# Patient Record
Sex: Male | Born: 1955
Health system: Southern US, Community
[De-identification: ages and names within clinical notes are randomized; demographics above are authoritative.]

## PROBLEM LIST (undated history)

## (undated) DIAGNOSIS — I10 Essential (primary) hypertension: Secondary | ICD-10-CM

## (undated) DIAGNOSIS — Z0389 Encounter for observation for other suspected diseases and conditions ruled out: Secondary | ICD-10-CM

## (undated) DIAGNOSIS — Z8739 Personal history of other diseases of the musculoskeletal system and connective tissue: Secondary | ICD-10-CM

## (undated) DIAGNOSIS — E785 Hyperlipidemia, unspecified: Secondary | ICD-10-CM

## (undated) HISTORY — DX: Essential (primary) hypertension: I10

---

## 1998-12-01 ENCOUNTER — Emergency Department (HOSPITAL_COMMUNITY): Admission: EM | Admit: 1998-12-01 | Discharge: 1998-12-01 | Payer: Self-pay | Admitting: Emergency Medicine

## 1999-06-12 ENCOUNTER — Emergency Department (HOSPITAL_COMMUNITY): Admission: EM | Admit: 1999-06-12 | Discharge: 1999-06-12 | Payer: Self-pay | Admitting: Emergency Medicine

## 1999-06-12 ENCOUNTER — Encounter: Payer: Self-pay | Admitting: Emergency Medicine

## 2000-09-05 ENCOUNTER — Emergency Department (HOSPITAL_COMMUNITY): Admission: EM | Admit: 2000-09-05 | Discharge: 2000-09-05 | Payer: Self-pay | Admitting: Emergency Medicine

## 2000-09-05 ENCOUNTER — Encounter: Payer: Self-pay | Admitting: Emergency Medicine

## 2000-10-10 ENCOUNTER — Encounter: Admission: RE | Admit: 2000-10-10 | Discharge: 2001-01-08 | Payer: Self-pay | Admitting: Orthopedic Surgery

## 2001-04-04 ENCOUNTER — Emergency Department (HOSPITAL_COMMUNITY): Admission: EM | Admit: 2001-04-04 | Discharge: 2001-04-04 | Payer: Self-pay | Admitting: Emergency Medicine

## 2007-12-26 DIAGNOSIS — IMO0001 Reserved for inherently not codable concepts without codable children: Secondary | ICD-10-CM

## 2007-12-26 HISTORY — PX: CARDIAC CATHETERIZATION: SHX172

## 2007-12-26 HISTORY — DX: Reserved for inherently not codable concepts without codable children: IMO0001

## 2007-12-28 ENCOUNTER — Observation Stay (HOSPITAL_COMMUNITY): Admission: EM | Admit: 2007-12-28 | Discharge: 2007-12-29 | Payer: Self-pay | Admitting: Emergency Medicine

## 2007-12-28 ENCOUNTER — Encounter (INDEPENDENT_AMBULATORY_CARE_PROVIDER_SITE_OTHER): Payer: Self-pay | Admitting: *Deleted

## 2007-12-28 IMAGING — CR DG CHEST 1V PORT
1 series · 1 of 1 positions shown · non-contrast
Comparison: None

CLINICAL DATA: Chest pain shortness of breath

PORTABLE CHEST - 1 VIEW

[AP]
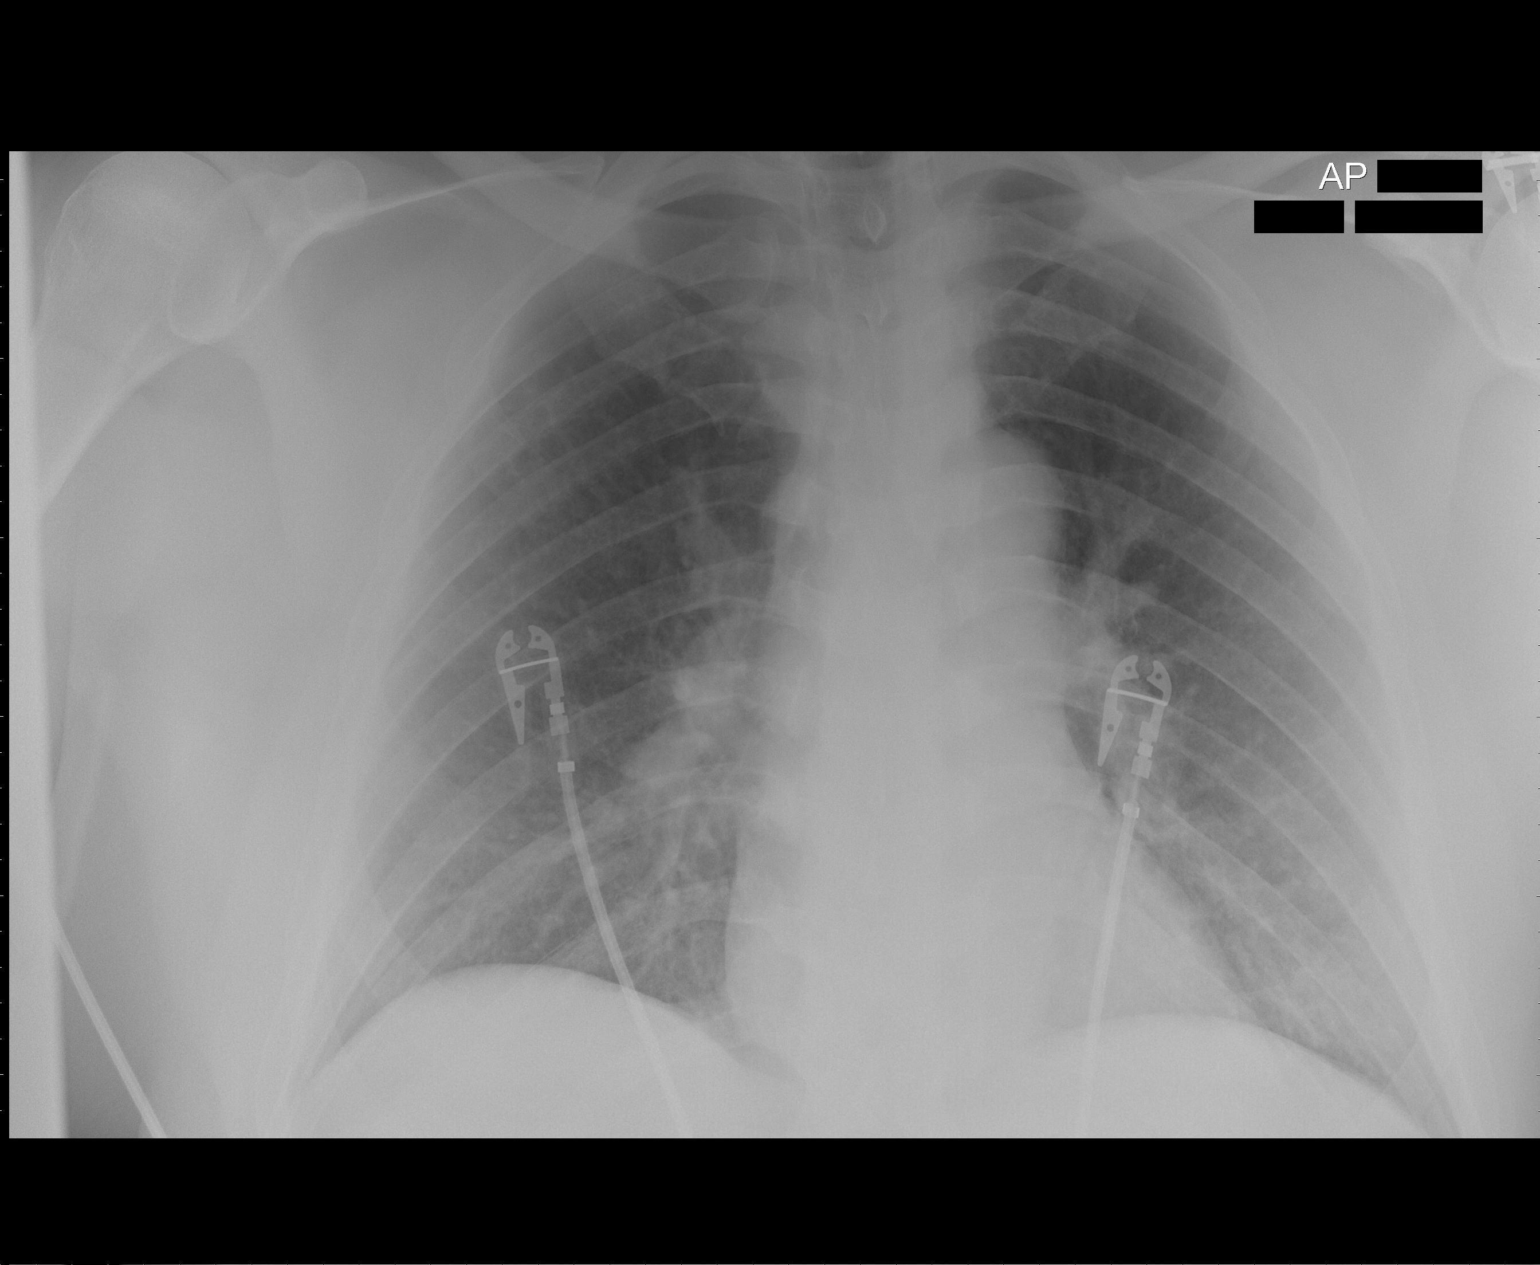

[1 of 1 positions shown; findings below may reference images not displayed]

FINDINGS: Midline trachea. Normal heart size and mediastinal
contours. No pleural effusion or pneumothorax. Vague increased
density at the left lung base is  likely atelectasis and/or
vascular crowding.
IMPRESSION: No acute cardiopulmonary disease.

## 2008-04-17 ENCOUNTER — Inpatient Hospital Stay (HOSPITAL_COMMUNITY): Admission: EM | Admit: 2008-04-17 | Discharge: 2008-04-18 | Payer: Self-pay | Admitting: Emergency Medicine

## 2008-04-17 IMAGING — CR DG CHEST 2V
2 series · 2 of 2 positions shown · non-contrast
Comparison: [DATE]

CLINICAL DATA: Chest pain

CHEST - 2 VIEW

[w chest pa]
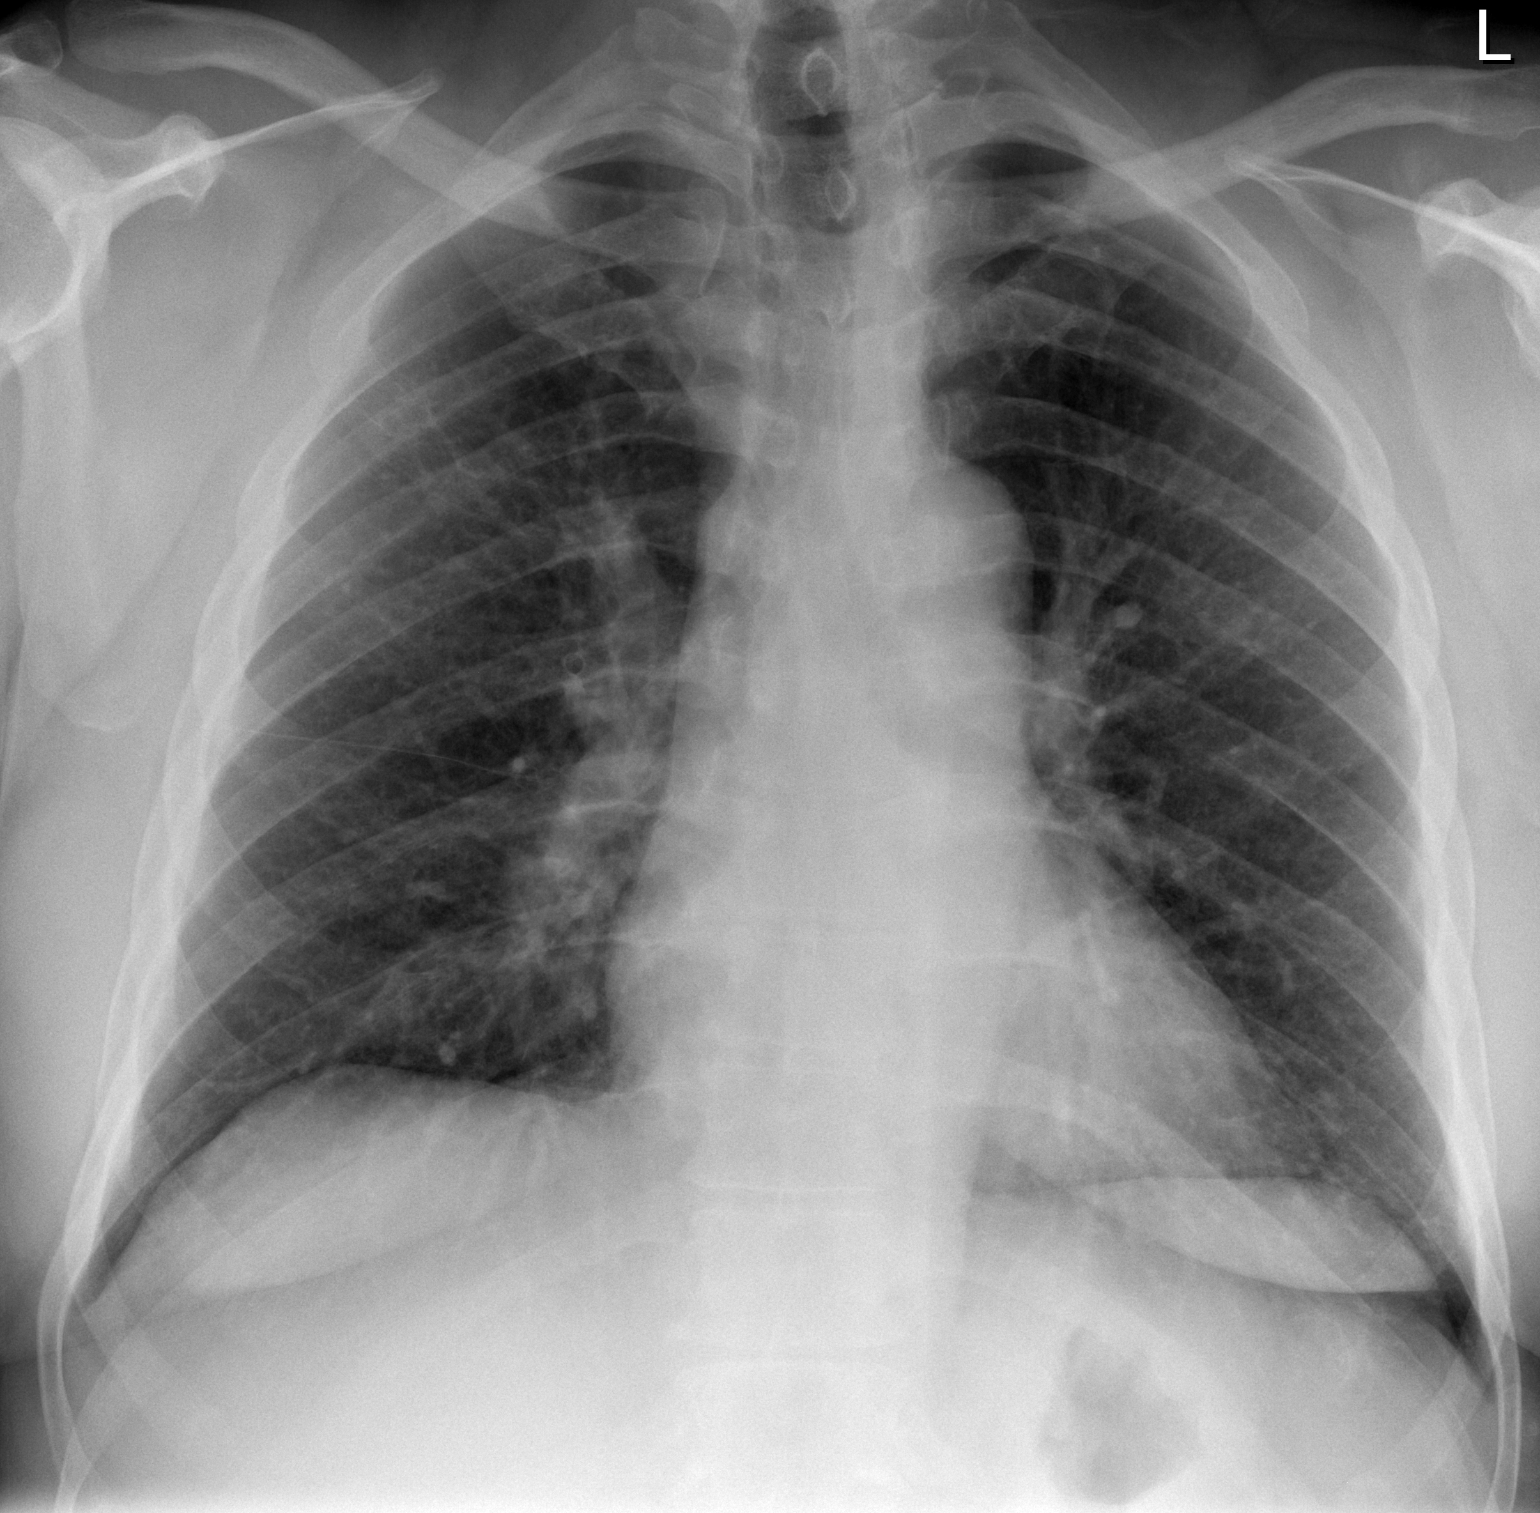

[w chest lat]
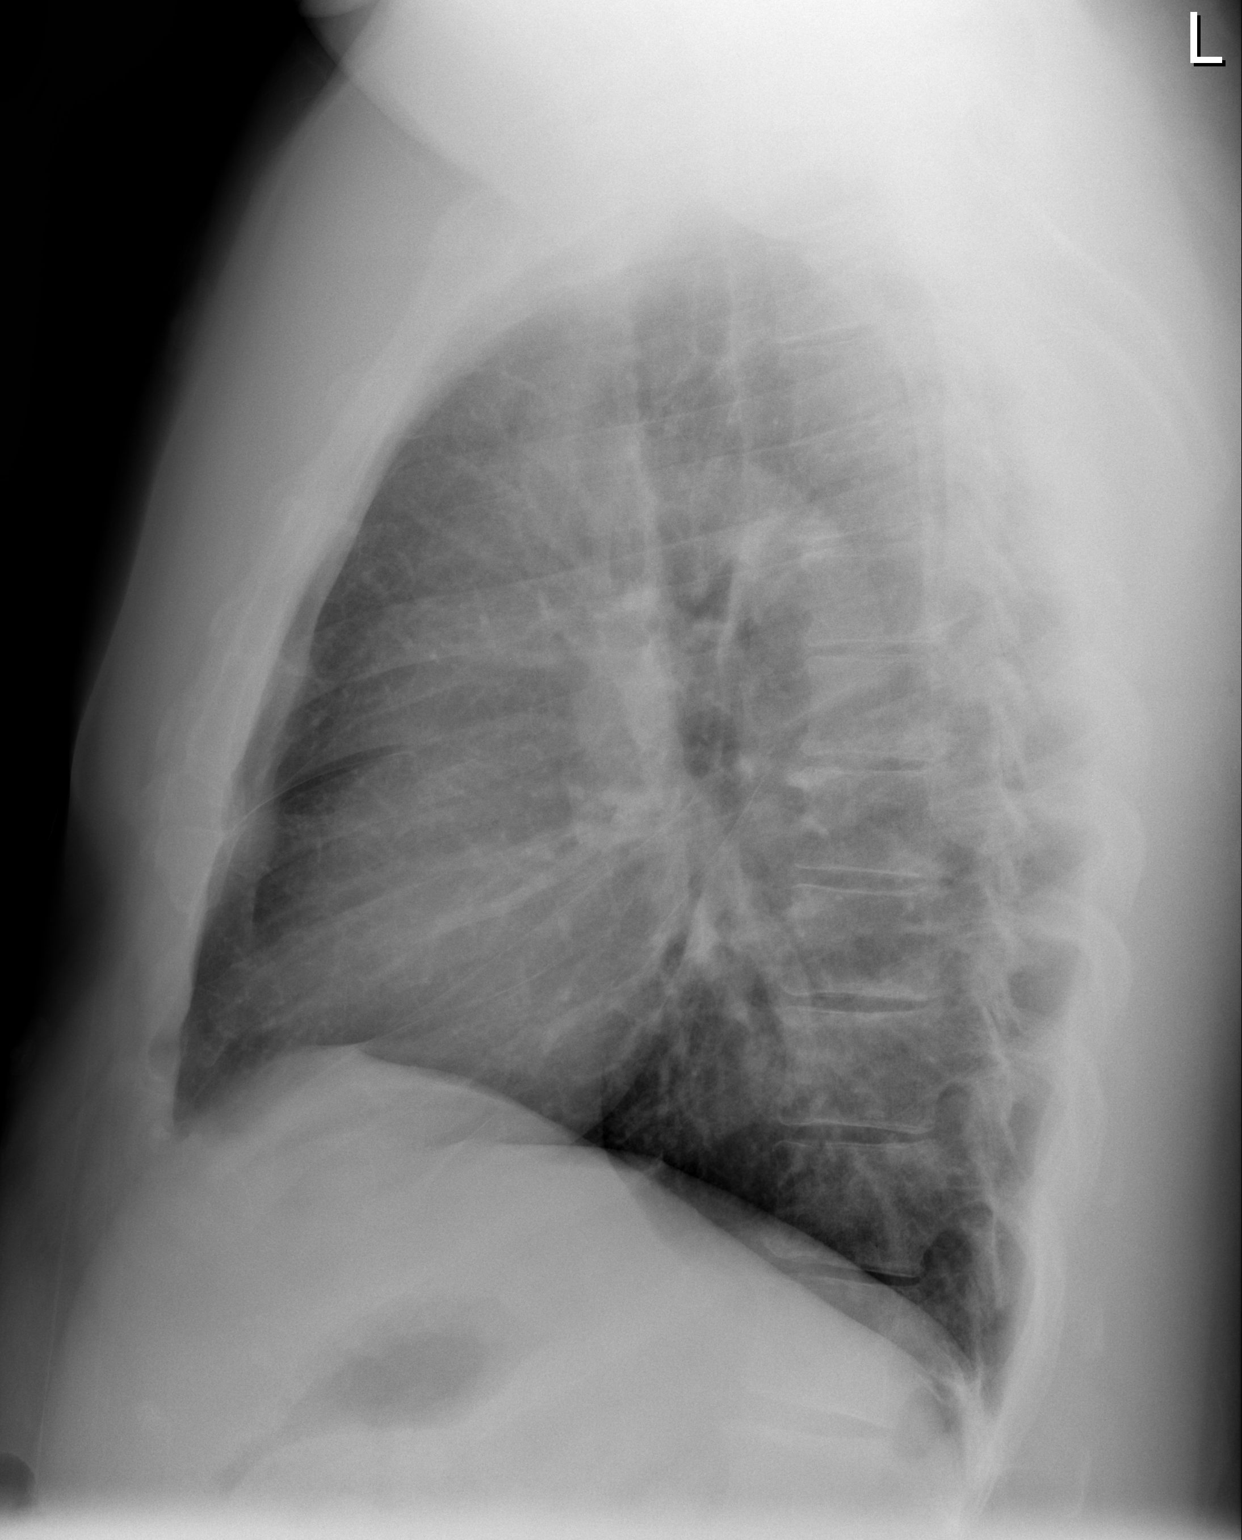

[2 of 2 positions shown; findings below may reference images not displayed]

FINDINGS: Heart size within normal limits.  Mildly accentuated
markings suggesting bronchitic type changes.  No congestive heart
failure or pneumonia.  No pleural fluid or osseous lesions.
IMPRESSION: Bronchitic changes - no active disease.

## 2008-04-18 IMAGING — CT CT ANGIO CHEST
2 of 6 series · 19 of 46 positions shown · IV contrast (APPLIED)
Comparison: None

CLINICAL DATA: Chest pain, shortness of breath, question pulmonary
embolism

CT ANGIOGRAPHY CHEST
TECHNIQUE: Multidetector CT imaging of the chest was performed
using the standard protocol during bolus administration of
intravenous contrast. Multiplanar CT image reconstructions
including MIPs were obtained to evaluate the vascular anatomy.
Contrast: 100 ml [BC]

[Series 10: pulm embolism 1.0 b25f thins · axial · 0.64mm/px · z∈[+174,+396]mm · 16 of 244 slices shown]
[im 11/244  lung]
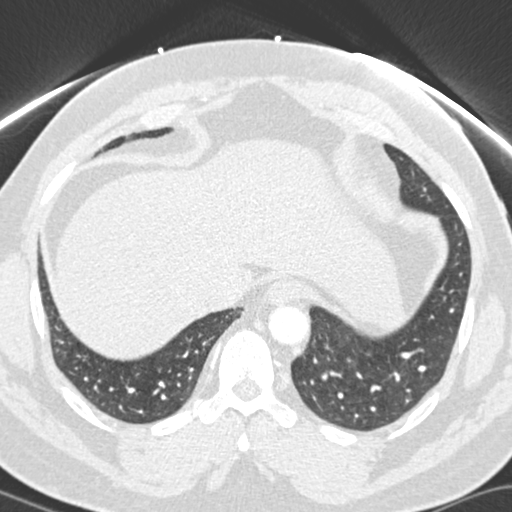
[im 32/244  soft-tissue]
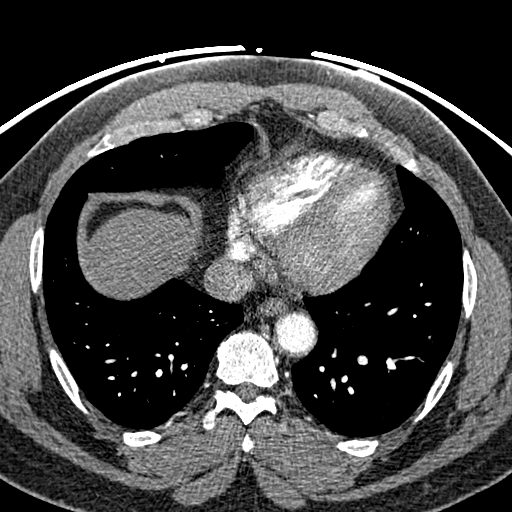
[im 43/244  lung]
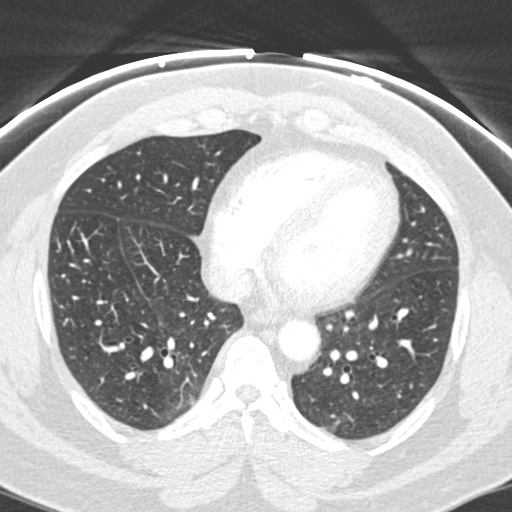
[im 53/244  soft-tissue]
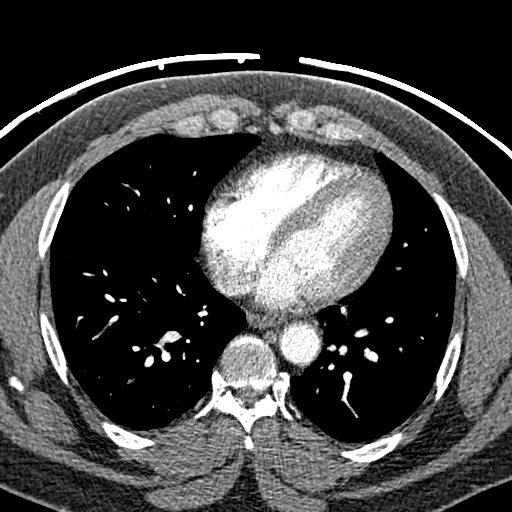
[im 74/244  lung]
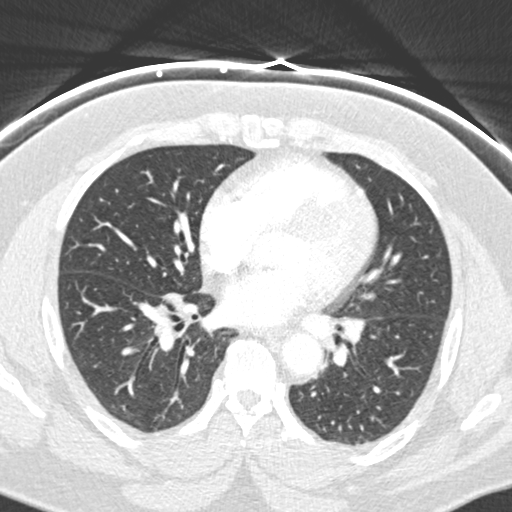
[im 85/244  soft-tissue]
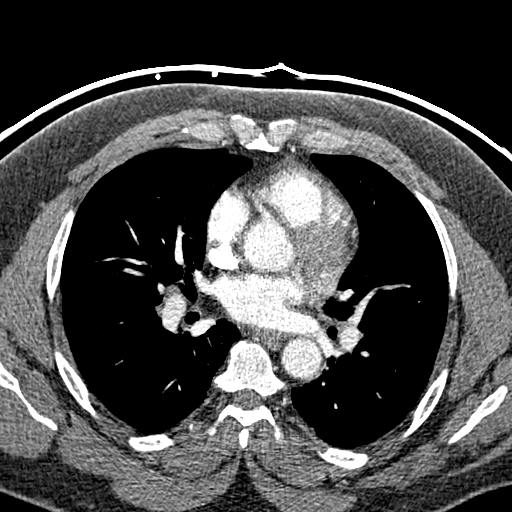
[im 96/244  lung]
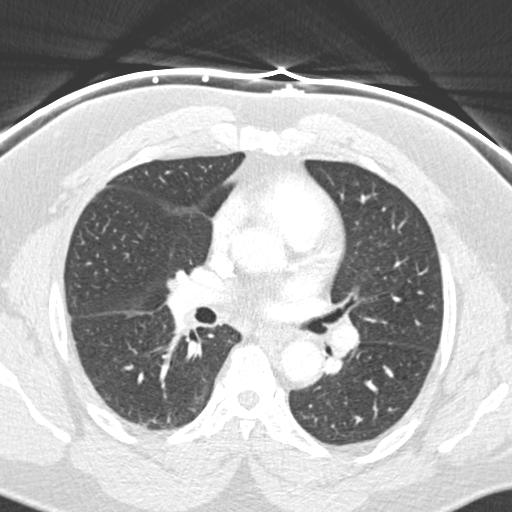
[im 117/244  soft-tissue]
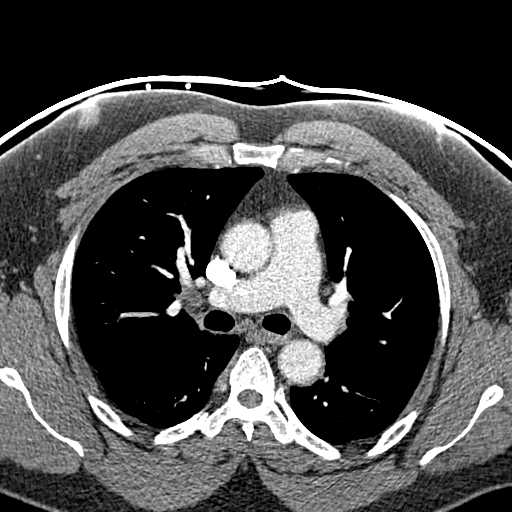
[im 127/244  lung]
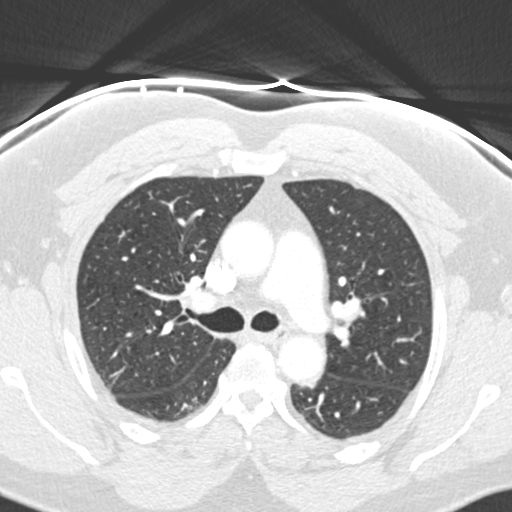
[im 148/244  soft-tissue]
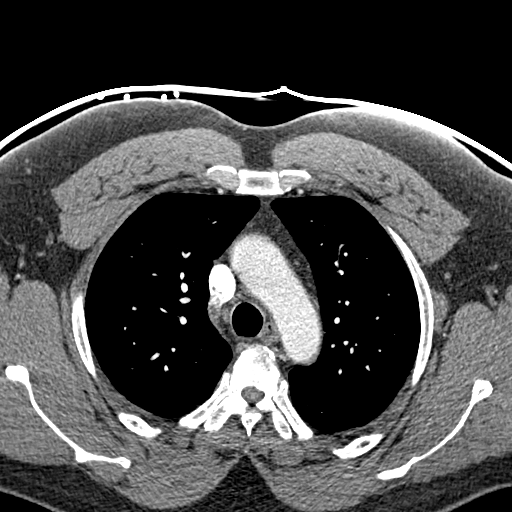
[im 159/244  lung]
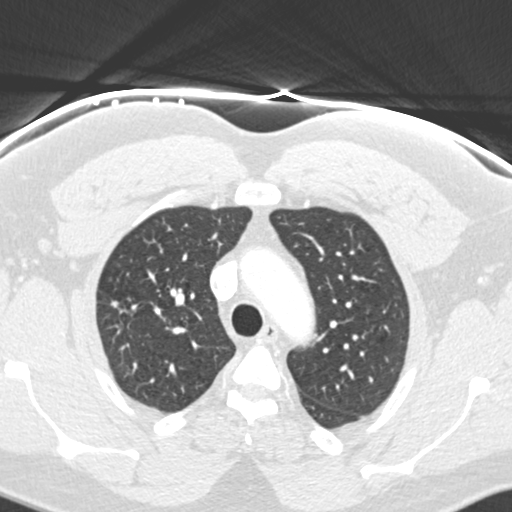
[im 170/244  soft-tissue]
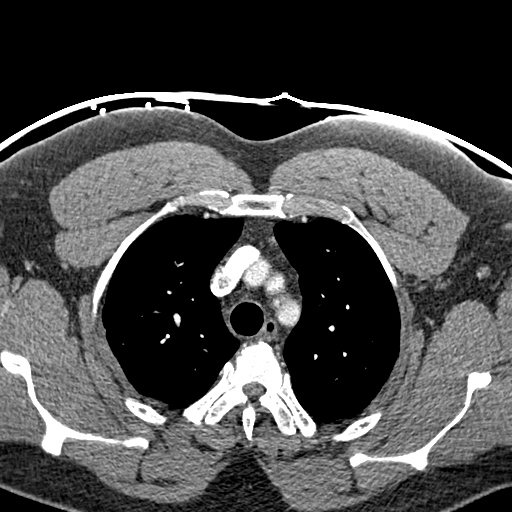
[im 191/244  lung]
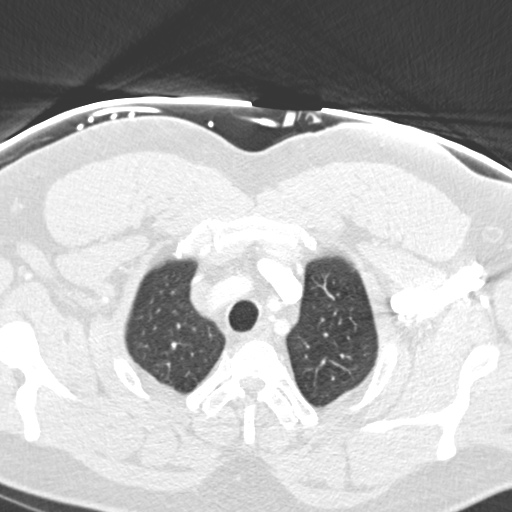
[im 201/244  soft-tissue]
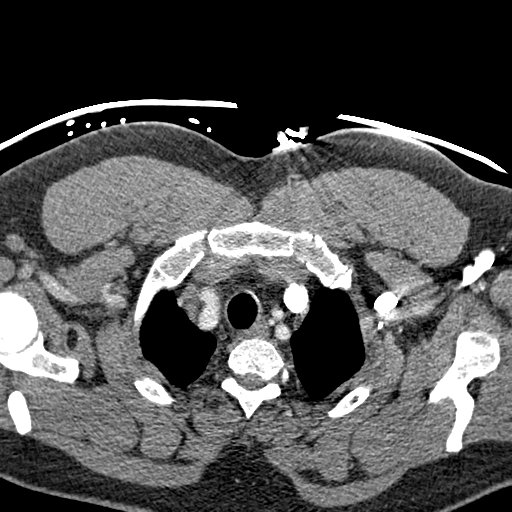
[im 212/244  lung]
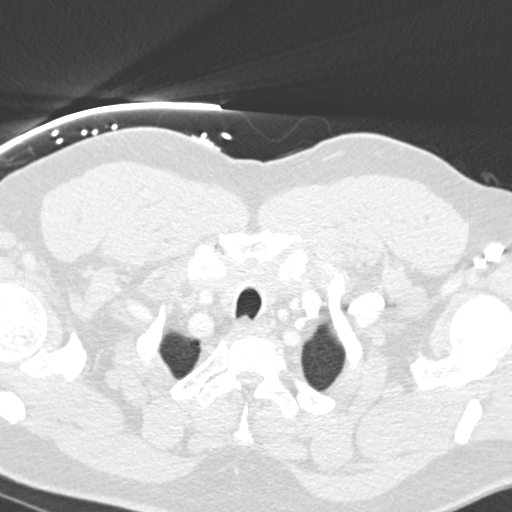
[im 233/244  soft-tissue]
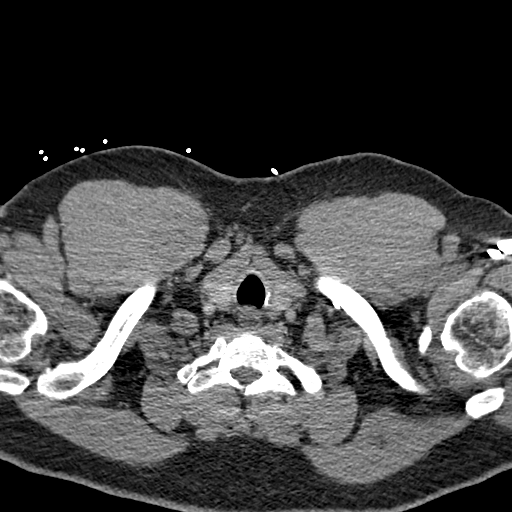

[Series 604: coronal mips · coronal · 0.64mm/px · 3 of 110 slices shown]
[im 22/110  soft-tissue]
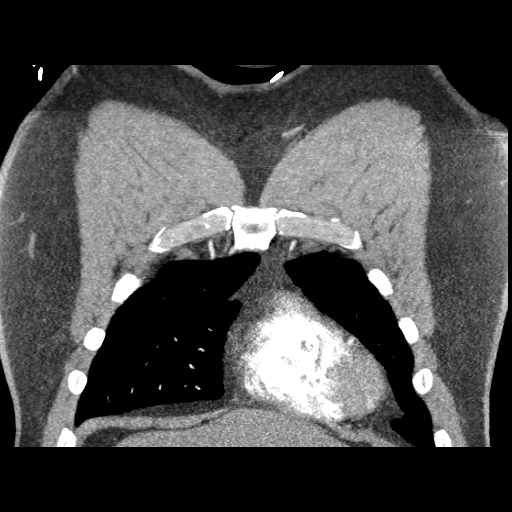
[im 44/110  soft-tissue]
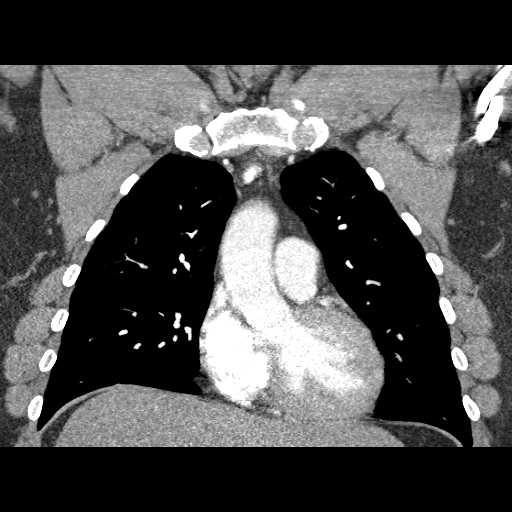
[im 66/110  soft-tissue]
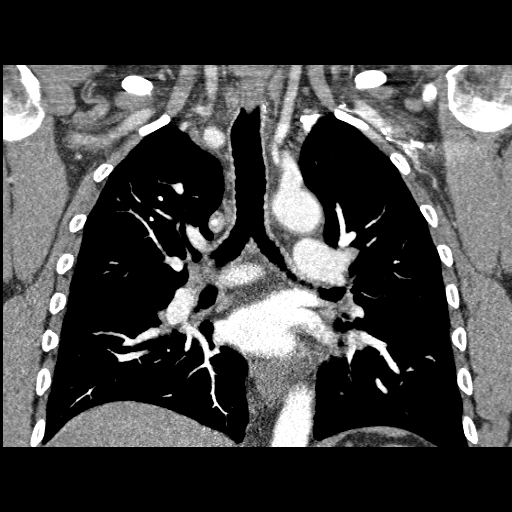

[19 of 46 positions shown; findings below may reference images not displayed]

FINDINGS: Aorta normal caliber without aneurysm or dissection.
Pulmonary arteries patent.
No evidence of pulmonary embolic disease.
No thoracic adenopathy.
Calcified granuloma right upper lobe image 27 with minimal adjacent
scarring.
Scattered peribronchial thickening.
Minimal dependent atelectasis.
No pulmonary infiltrate or pleural effusion.
Bones unremarkable.

 Review of the MIP images confirms the above findings.
IMPRESSION: Minimal basilar atelectasis with old granulomatous disease right
upper lobe.
No evidence of pulmonary embolism.

## 2008-10-09 ENCOUNTER — Encounter: Admission: RE | Admit: 2008-10-09 | Discharge: 2008-10-09 | Payer: Self-pay | Admitting: Internal Medicine

## 2008-10-09 IMAGING — US US ABDOMEN COMPLETE
1 series · 14 of 25 positions shown · non-contrast
Comparison: None

CLINICAL DATA: Right upper quadrant abdominal pain

COMPLETE ABDOMINAL ULTRASOUND

[Series 1: us abdomen complete · 0.31mm/px · 14 of 87 slices shown]
[im 1/87]
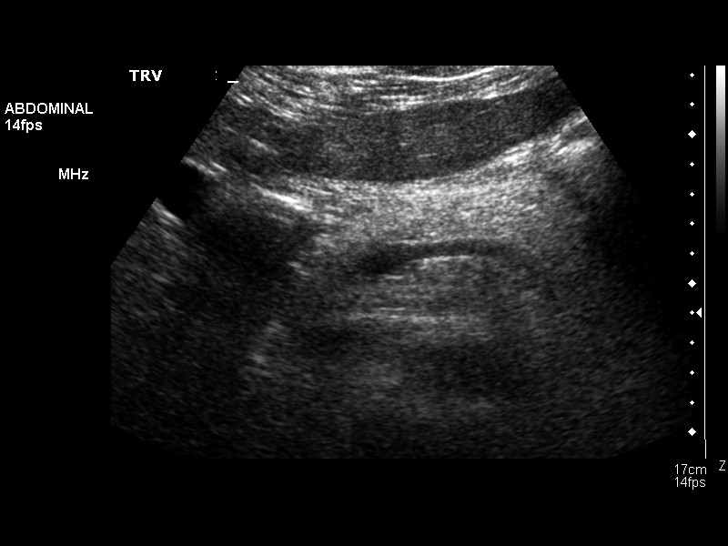
[im 8/87]
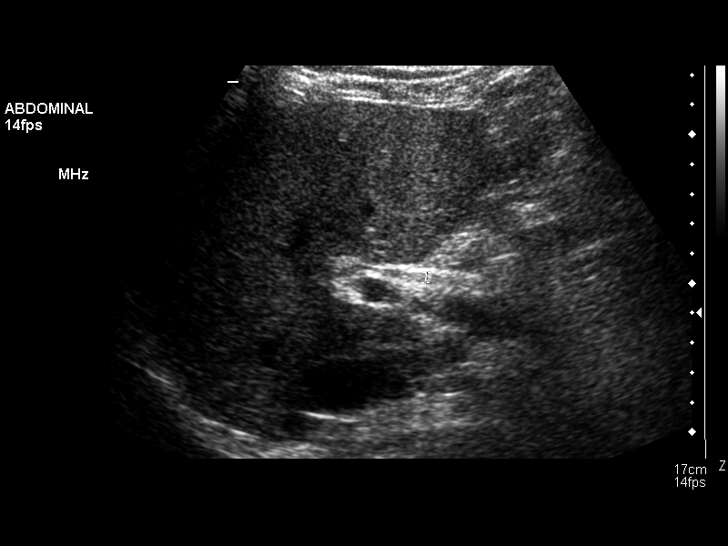
[im 15/87]
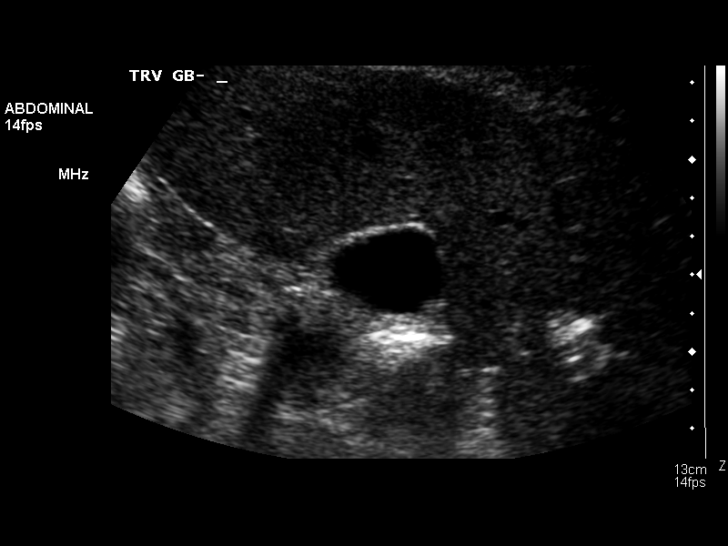
[im 22/87]
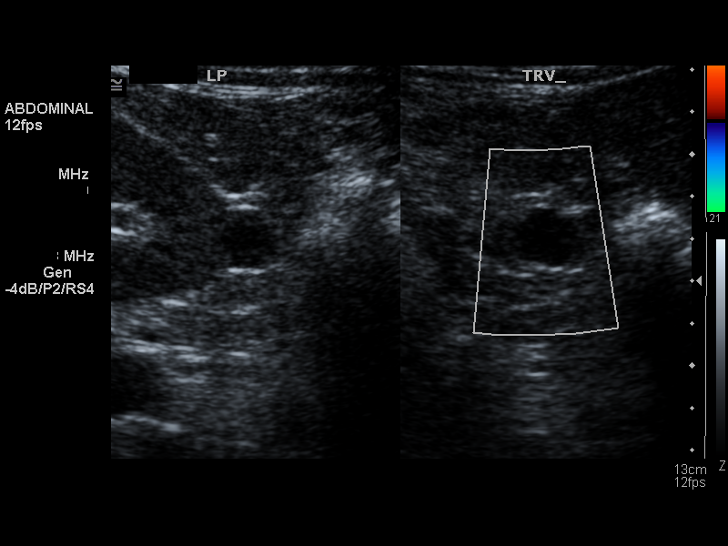
[im 29/87]
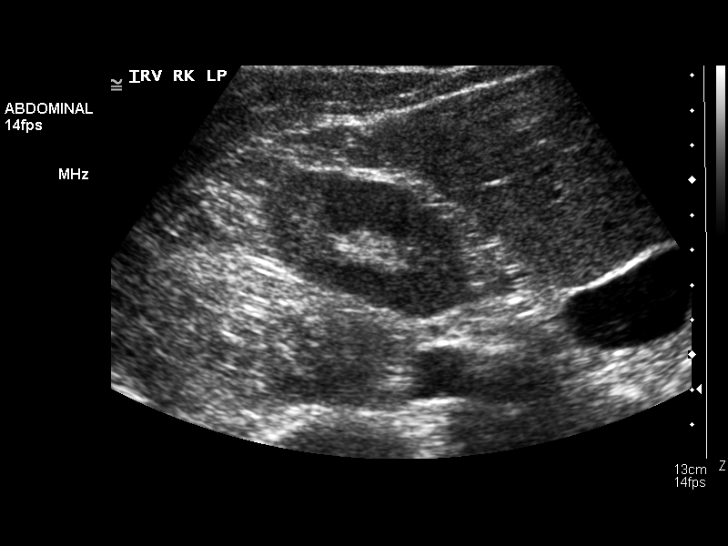
[im 33/87]
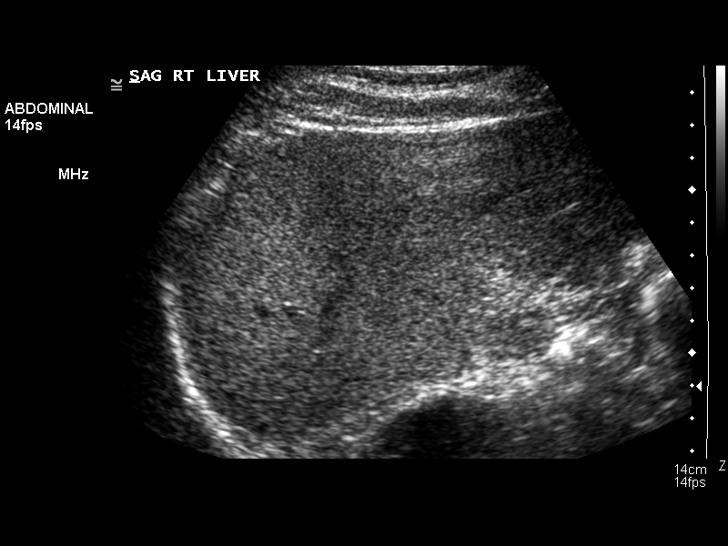
[im 40/87]
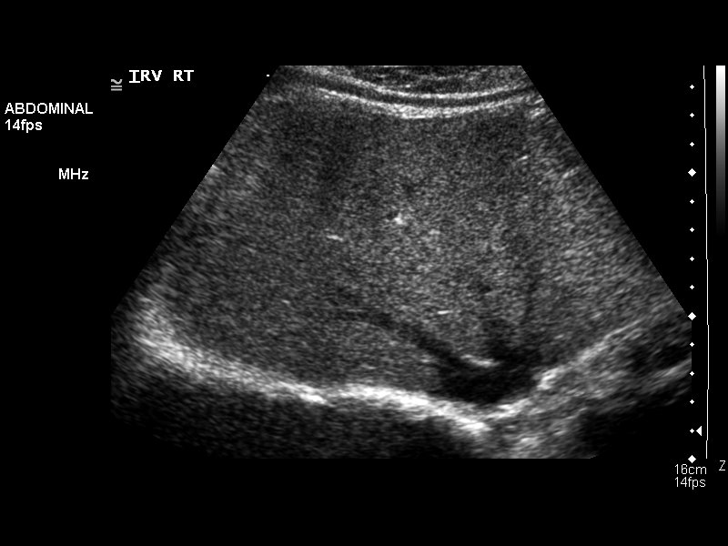
[im 47/87]
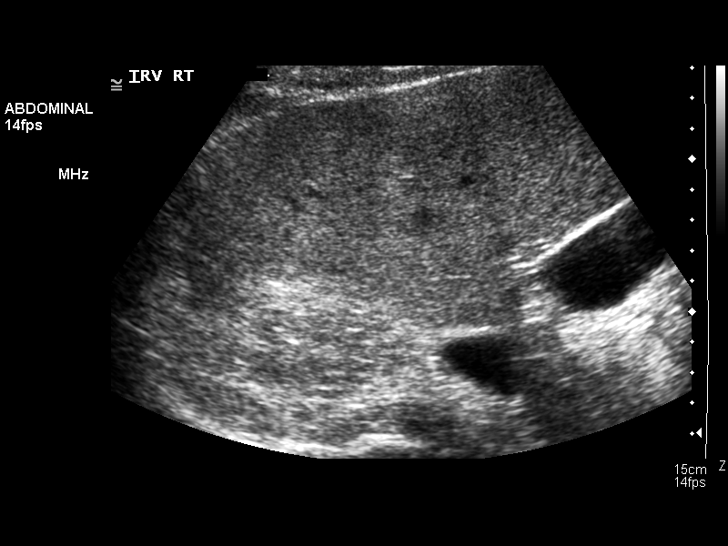
[im 54/87]
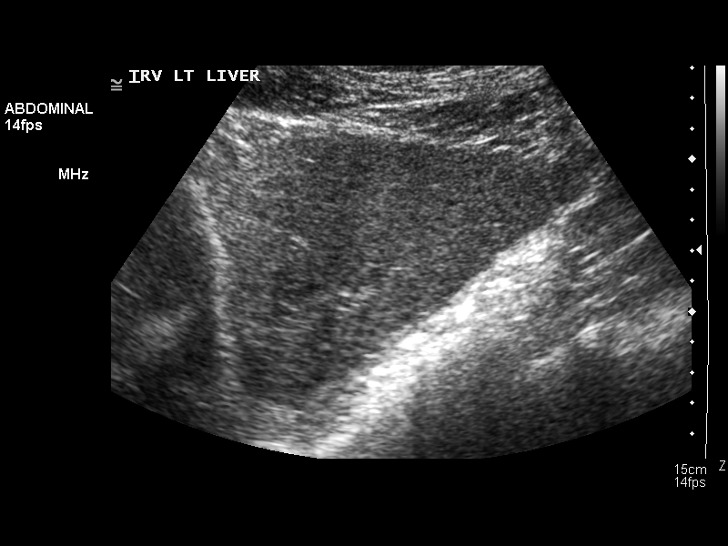
[im 58/87]
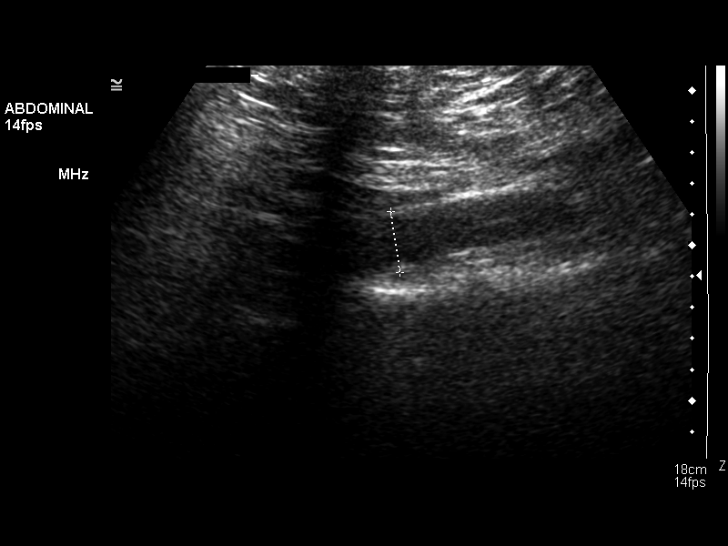
[im 65/87]
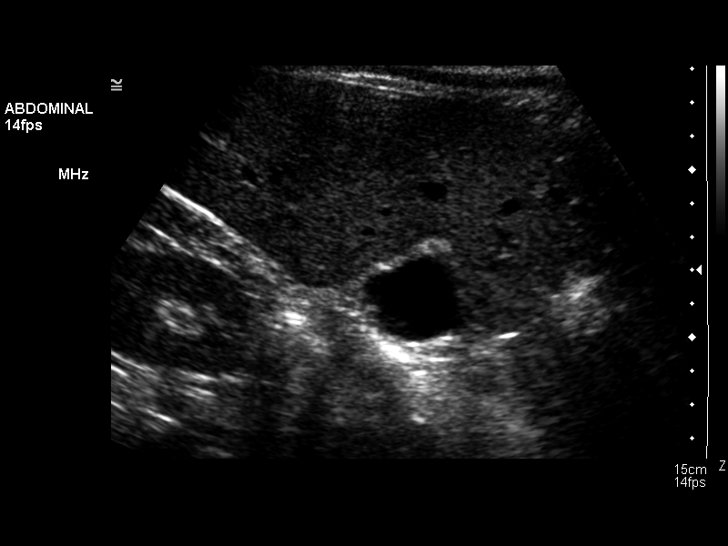
[im 72/87]
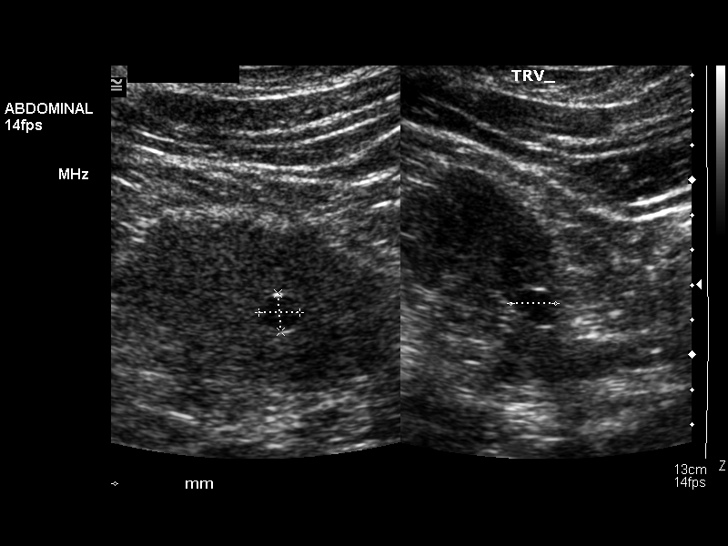
[im 79/87]
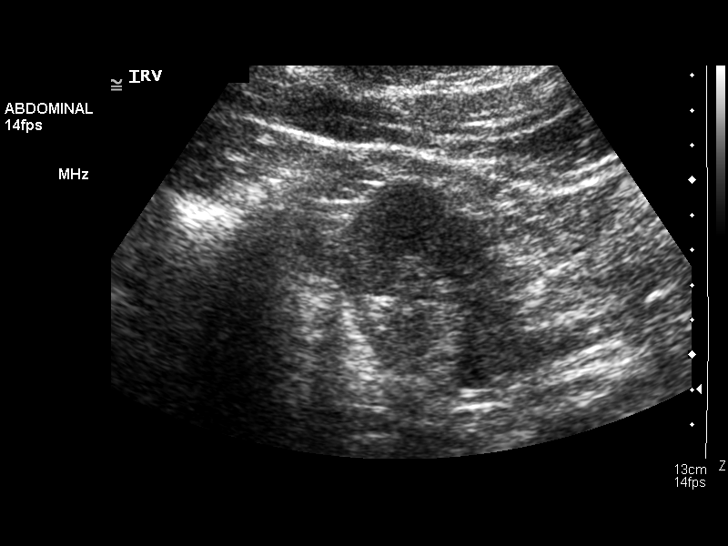
[im 87/87]
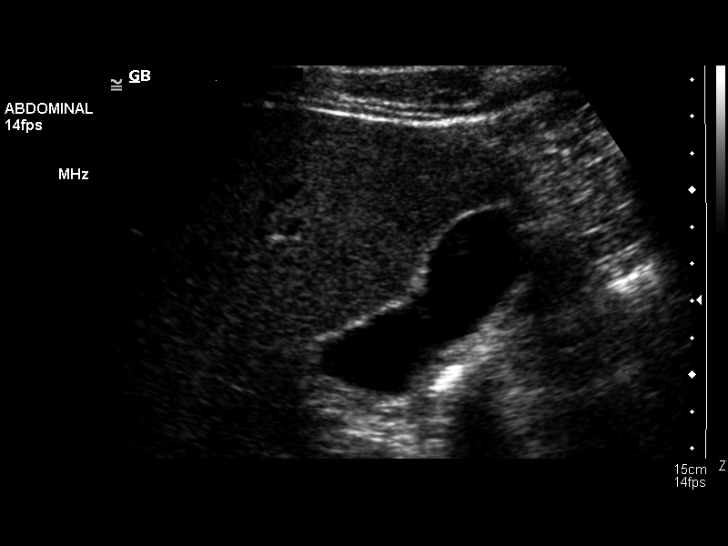

[14 of 25 positions shown; findings below may reference images not displayed]

FINDINGS: Gallbladder:  The gallbladder is well seen and no gallstones are
noted.  No pain is present over the gallbladder upon compression.

Common bile duct:  The common bile duct is normal measuring 3.4 mm
in diameter.

Liver:  The liver is echogenic consistent with fatty infiltration.
No focal abnormality is seen.

IVC:  The IVC is partially obscured by bowel gas.

Pancreas:  The pancreas is normal in size.

Spleen:  The spleen appears normal.

Right Kidney:  No hydronephrosis is seen.  The right kidney
measures 13.1 cm sagittally.  There is a small cyst in the lower
pole of 1.4 x 1.4 x 2.0 cm.

Left Kidney:  No hydronephrosis.  The left kidney measures 12.1 cm.
There is a small cyst in the lower pole of 1.2 x 1.1 x 1.3 cm.

Abdominal aorta:  The abdominal aorta is normal in caliber.
IMPRESSION: 1.  No gallstones.
2.  Fatty infiltration of the liver.

## 2010-05-07 LAB — CBC
MCHC: 34.8 g/dL (ref 30.0–36.0)
MCV: 93.7 fL (ref 78.0–100.0)
Platelets: 216 10*3/uL (ref 150–400)
WBC: 8.9 10*3/uL (ref 4.0–10.5)

## 2010-05-07 LAB — TSH: TSH: 1.088 u[IU]/mL (ref 0.350–4.500)

## 2010-05-07 LAB — TROPONIN I
Troponin I: <0.01 ng/mL (ref 0.00–0.06)
Troponin I: <0.01 ng/mL (ref 0.00–0.06)

## 2010-05-07 LAB — BASIC METABOLIC PANEL
BUN: 7 mg/dL (ref 6–23)
Calcium: 9.2 mg/dL (ref 8.4–10.5)
Chloride: 108 mEq/L (ref 96–112)
Creatinine, Ser: 1 mg/dL (ref 0.4–1.5)

## 2010-05-07 LAB — POCT CARDIAC MARKERS: Myoglobin, poc: 208 ng/mL (ref 12–200)

## 2010-05-07 LAB — DIFFERENTIAL
Basophils Relative: 1 % (ref 0–1)
Eosinophils Absolute: 0.4 10*3/uL (ref 0.0–0.7)
Lymphs Abs: 3.1 10*3/uL (ref 0.7–4.0)
Neutrophils Relative %: 55 % (ref 43–77)

## 2010-05-07 LAB — CK TOTAL AND CKMB (NOT AT ARMC)
CK, MB: 5 ng/mL — ABNORMAL HIGH (ref 0.3–4.0)
Relative Index: 0.5 (ref 0.0–2.5)
Total CK: 651 U/L — ABNORMAL HIGH (ref 7–232)
Total CK: 825 U/L — ABNORMAL HIGH (ref 7–232)

## 2010-06-09 NOTE — Discharge Summary (Signed)
[name], [name] NO.:  [id number]   MEDICAL RECORD NO.:  [id number]          PATIENT TYPE:  INP   LOCATION:  3734                         FACILITY:  [facility]   PHYSICIAN:  Charlestine Massed, MDDATE OF BIRTH:  Sep 09, 1955   DATE OF ADMISSION:  04/17/2008  DATE OF DISCHARGE:                               DISCHARGE SUMMARY   PRIMARY CARE PHYSICIAN:  Buren Kos, MD   CARDIOLOGY:  Rosine Abe, M.D.   REASON FOR ADMISSION:  Chest pain.   DISCHARGE DIAGNOSES:  1. Chest pain of noncardiac origin.  2. Dyslipidemia.  3. Questionable hypertension.  4. 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   DISCHARGE MEDICATIONS:  1. Imdur 30 mg p.o. daily.  2. Niaspan 500 mg p.o. q.h.s.  3. Aspirin 81 mg p.o. q.h.s.   HOSPITAL COURSE:  1. Chest pain.  A 55 year old gentleman, [name], with prior      history of dyslipidemia for which he was placed on Niaspan by Dr.      Reyes Ivan, comes into the emergency room with complaints of 2 days of      chest pain on the left side of the chest radiating to the fourth      intercostal space which is sharp in nature, stabbing and constant.      No alleviating or relieving factors.  The patient has already quit      smoking 7 years ago.  He denies any alcohol or drug abuse.  The      patient had a cardiac catheterization December 2008 with similar      chest pain symptoms.  He was found to have no obstruction in the      coronary arteries with minimal irregularities.  Advised to take      Niaspan by Dr. Reyes Ivan in view of his low HDL and slightly increased      LDL.  The patient has not taken this medication as he was waiting      Medicaid.   The patient was observed on the floor for chest pain.  Troponins and CPK  were done which are negative.  The EKG was showing minimal T-wave  changes in the lateral leads which were not typically ischemic, but with  new changes compared to the December EKG.  I spoke to Dr. Reyes Ivan in view  of the cardiac cath showing no significant block or obstruction.  There  is no need for any further workup at this moment.  The patient was asked  to see Dr. Reyes Ivan in his office in 1-2 weeks.   The cardiac catheterization report done January 18, 2008 shows in the  distal LAD intramyocardial bridging was noted.  Chest pain could be  related to that.  The patient was started on Imdur in view of that.  Currently, the patient does not have much chest pain now.  He is being  currently discharged in view of all negative studies.   1. Dyslipidemia.  His lab results this time showed  an LDL of 111, HDL      of 32 and his triglycerides are normal at 109.  In view of the      dyslipidemia, the patient has been continued on the Niaspan which      was prescribed by Dr. Reyes Ivan before.  Diet counseling has been      given.  The patient is advised to adopt a low-fat diet.   1. Questionable hypertension.  The patient stated that once when he      checked his blood pressure in the pharmacy, it was slightly      elevated.  When he came to the emergency room, his pain was not      that significant, but still he had a slightly elevated blood      pressure.  Currently, his blood pressure is okay.  He has been      started on Imdur for his chest pain symptoms.  The patient has been      advised to check blood pressure frequently and report it to his      primary doctor if it is high.   DISCHARGE INSTRUCTIONS:  1. Hehas been advised to see Dr.Shaw for follow up.  2. He has beenasked to see Dr. Reyes Ivan with a visit in 1-2 weeks in his      office.  The patient has his phone number and he exhibits      understanding.  3. Dietary counseling given for low-fat, heart-healthy diet.  4. The patient has been advised to drink a lots of fluids and keep      himself well hydrated.  5. Weight loss education advised.  The patient falls into the  obese      category, so weight loss has been advised.   DISPOSITION:  The patient is discharged back home.   A total of 45 minutes was spent on this discharge.      Charlestine Massed, MD  Electronically Signed     UT/MEDQ  D:  04/18/2008  T:  04/18/2008  Job:  [id number]   cc:   Elmore Guise., M.D.  Kari Baars, M.D.

## 2010-06-09 NOTE — H&P (Signed)
Lance, Morris NO.:  [id number]   MEDICAL RECORD NO.:  [id number]          PATIENT TYPE:  OBV   LOCATION:  1826                         FACILITY:  [facility]   PHYSICIAN:  Jake Bathe, MD      DATE OF BIRTH:  29-Apr-1955   DATE OF ADMISSION:  12/28/2007  DATE OF DISCHARGE:                              HISTORY & PHYSICAL   CARDIOLOGIST:  Colleen Can. Deborah Chalk, MD   PRIMARY CARE PHYSICIAN:  Kari Baars, MD   CHIEF COMPLAINT:  Chest pain.   HISTORY OF PRESENT ILLNESS:  A 55 year old male with obesity,  hypertension, questionable hyperlipidemia, nonsmoker, nondiabetic who  was awoken this morning at around 1:00 a.m. with sudden onset 8/10 chest  pain substernal with increased shortness of breath and sweating lasting  approximately one-half hour in duration and relieved with nitroglycerin  administered by both EMS and ER.  He had never had discomfort quite like  this before.  Approximately 1-2 years ago, he was referred to Dr.  Ronnald Nian office at [facility] for further evaluation of  chest discomfort and underwent a nuclear stress test, which was normal  per the patient.  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  Currently,  he is lying comfortably with mild 1/10 discomfort in the  epigastric/substernal area with his wife at bedside.   PAST MEDICAL HISTORY:  As above including neck pain, prior truck driving  accident.   SURGICAL HISTORY:  None.   ALLERGIES:  No known drug allergies.   MEDICATIONS:  He was on Toprol, but now he is off secondary to cost.  He  is not taking anything currently.   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  Denies any tobacco, alcohol, or drug use.  He is married.   FAMILY HISTORY:  No early family history of coronary artery disease.  His father died at a young age following a fire accident.   REVIEW OF  SYSTEMS:  Occasional neck pain, no syncope, no palpitations,  no bleeding problems, possible heartburn.  Unless stated above, all  other 12-review of systems negative.   PHYSICAL EXAMINATION:  VITAL SIGNS:  Blood pressure 122/67, pulse 74,  sating 97% on room air, breathing at a rate of 16 per minute.  GENERAL:  Alert and oriented x3, in no acute distress, lying comfortably  in bed.  HEENT:  Eyes, well-perfused conjunctivae.  EOMI.  No scleral icterus.  NECK:  Supple.  No lymphadenopathy.  No carotid bruits.  No JVD.  Thick.  Full range of motion noted.  CARDIOVASCULAR:  Regular rate and rhythm with no murmurs, rubs, or  gallops.  Difficult to palpate PMI due to body habitus.  LUNGS:  Clear to auscultation bilaterally.  No wheezes.  No rales.  Normal respiratory effort.  ABDOMEN:  Obese.  Positive bowel sounds.  Nontender.  No bruits  appreciated.  EXTREMITIES:  No clubbing, cyanosis, or edema.  Normal distal pulses.  2+ femoral pulses bilaterally.  No bruits.  NEUROLOGIC:  Nonfocal.  No tremors noted.  Did not assess gait.  PSYCH:  Normal affect.  SKIN:  Warm, dry, and intact.  No rashes.   DATA:  ECG personally reviewed showed normal sinus rhythm, rate 86 with  no ST deviations, no Q waves.  Chest x-ray personally reviewed shows no  acute airspace disease.  Point-of-care cardiac markers x2 at 3:00 a.m.  and 6:00 a.m. are negative x2.  D-dimer was negative at 0.35.  Sodium  140, potassium 3.9, BUN 6, creatinine 1.3. Prior medical records  reviewed.   ASSESSMENT/PLAN:  A 55 year old male with obesity, hypertension,  possible hyperlipidemia with chest pain.  1. Chest pain, possible angina.  D-dimer is reassuring.  Chest x-ray      okay.  ECG reassuring.  Cardiac enzymes reassuring, but he is still      having dull discomfort in the epigastric area.  Also possible      etiologies of cardiac include gastroesophageal reflux disease or      musculoskeletal discomfort.  Reassuring  stress test approximately 1      or 2 years ago, which was supposedly normal per the patient.  I do      not have these records available.  Plan for him would be to      continue n.p.o. status and possibly repeat stress test versus      catheterization.  I will allow his cardiologist, Dr. Roger Shelter, to make the final decision.  Nonetheless, we will continue      with aspirin.  We will initiate low-dose beta-blocker 25 mg twice a      day.  He is currently normotensive.  Nitroglycerin p.r.n.  Given      that he has no ECG changes, his chest pain is virtually relieved      and his cardiac biomarkers are normal.  I will not initiate heparin      at this time.  2. Obesity.  Encouraged weight loss.  3. Hypertension - we will add low-dose metoprolol 25 mg twice a day.      He was not able to take Toprol secondary to cost.  We will place on      observation status.      Jake Bathe, MD  Electronically Signed     MCS/MEDQ  D:  12/28/2007  T:  12/28/2007  Job:  [id number]   cc:   Colleen Can. Deborah Chalk, M.D.  Kari Baars, M.D.

## 2010-06-09 NOTE — H&P (Signed)
NAMEDASON, MOSLEY NO.:  [id number]   MEDICAL RECORD NO.:  [id number]          PATIENT TYPE:  INP   LOCATION:  1829                         FACILITY:  [facility]   PHYSICIAN:  Eduard Clos, MDDATE OF BIRTH:  [birth year]   DATE OF ADMISSION:  04/17/2008  DATE OF DISCHARGE:                              HISTORY & PHYSICAL   PRIMARY CARE PHYSICIAN:  Unassigned.   CHIEF COMPLAINT:  Chest pain.   HISTORY OF PRESENT ILLNESS:  A 55 year old male with no significant past  medical history presented with complaints of chest pain.  Patient's  chest pain has been ongoing for the last 2 days which started off as  stabbing type of pain which lasted around a few minutes and then started  getting more persistent today when he decided to come to the ER.  He did  have some diaphoresis last night.  He denies any nausea, vomiting,  palpitations, dizziness, loss of consciousness, any focal deficit, any  abdominal pain, fever, chills, dysuria, discharge or diarrhea.   The patient did have a cardiac catheterization in December 2009 which  was read as mild luminal irregularities with normal coronary arteries.  Patient presently is chest pain free after getting nitroglycerin, and he  has been admitted for further observation.   PAST MEDICAL HISTORY:  Nothing significant.   PAST SURGICAL HISTORY:  None.   MEDICATIONS PRIOR TO ADMISSION:  1. Aspirin 81 mg p.o. daily.   FAMILY HISTORY:  Not significant for any coronary artery disease.   SOCIAL HISTORY:  Patient quit smoking 7 years ago, denies any alcohol or  drug abuse.   REVIEW OF SYSTEMS:  As per the history of present illness, nothing else  significant.   PHYSICAL EXAMINATION:  GENERAL:  The patient was examined at bedside and  is not in acute distress.  VITAL SIGNS:  Blood pressure is 147/89, pulse 80 per minute, temperature  97, respiration 18 per minute, O2 saturation 97%.  HEENT:  Anicteric, no pallor.  CHEST:   Bilateral air entry present.  No rhonchi, no crepitation.  HEART:  S1, S2 heard.  ABDOMEN:  Soft, nontender, bowel sounds heard.  CENTRAL NERVOUS SYSTEM:  Alert, awake, oriented to time, place and  person.  Moves upper and lower extremities 5/5.  EXTREMITIES:  Peripheral pulses are heard.  No edema.   LABORATORIES:  EKG:  Normal sinus rhythm with T-wave inversion in lead  II and aVF.  Lead II T wave was even present on the last EKG.  Chest x-  ray:  Bronchitic changes, no active disease.  CBC:  WBC 8.9, hemoglobin  15.3, hematocrit 44, platelet 216, neutrophils 55%.  Basic metabolic  panel:  Sodium 139, potassium 4, chloride 108, carbon dioxide 26,  glucose 96, BUN 7, creatinine 1, calcium 9.2, troponin-I less than 0.05,  CK-MB 2.9.   ASSESSMENT:  1. Chest pain to rule out acute coronary syndrome.  2. Recent cardiac catheterization which showed normal coronary      arteries.  3. History of tobacco abuse.   PLAN:  We will admit the patient to  telemetry.  Cycle cardiac enzymes.  We will get a CT chest to rule out any dissection or pulmonary embolism.  Further recommendations as condition evolves.      Eduard Clos, MD  Electronically Signed     ANK/MEDQ  D:  04/17/2008  T:  04/17/2008  Job:  [id number]

## 2010-06-09 NOTE — Cardiovascular Report (Signed)
NAMEBRAYDEN, [name] NO.:  [id number]   MEDICAL RECORD NO.:  [id number]          PATIENT TYPE:  OBV   LOCATION:  3703                         FACILITY:  [facility]   PHYSICIAN:  Elmore Guise., M.D.DATE OF BIRTH:  [birth year]   DATE OF PROCEDURE:  12/29/2007  DATE OF DISCHARGE:                            CARDIAC CATHETERIZATION   INDICATIONS FOR PROCEDURE:  Increasing chest pain and exertional  dyspnea.   DESCRIPTION OF PROCEDURE:  The patient brought to the cardiac cath lab.  After appropriate informed consent, he was prepped and draped in sterile  fashion.  Approximately 15 mL of 1% lidocaine was used for local  anesthesia.  A 5-French sheath placed in the right femoral artery  without difficulty.  Coronary angiography, LV angiography, limited right  femoral angiography were performed.  The patient tolerated the procedure  well with no apparent complications.  He was transferred from the  cardiac cath lab in stable condition.   FINDINGS:  1. Left Main:  Normal.  2. LAD:  Mild luminal irregularities with distal intramyocardial      bridge noted.  3. D1:  Mild-to-moderate luminal irregularities.  4. LCX:  Nondominant with mild luminal irregularities.  5. OM-1/OM-2/OM-3:  All with mild luminal irregularities.  6. RCA:  Dominant with mild luminal irregularities.  7. LV:  EF 60%.  No wall motion abnormalities.  LVEDP is 15 mmHg.  8. Limited right femoral angiogram shows no evidence of peripheral      vascular disease, no evidence of dissection, and no extravasation      of dye.  Arteriotomy site would be adequate for closure; however, 5-      [place] sheath was used.   IMPRESSION:  1. Normal-appearing coronary arteries with mild luminal      irregularities, intramyocardial bridge noted in the very distal      left anterior descending.  2. Preserved left ventricular systolic function.   PLAN:  At this time, I would recommend aggressive risk factor  modification as indicated.  We will continue daily aspirin.  We will add  Niaspan because of his low HDL.  He will have routine followup with Dr.  Reyes Ivan in 2 weeks.      Elmore Guise., M.D.  Electronically Signed     TWK/MEDQ  D:  12/29/2007  T:  12/29/2007  Job:  [id number]

## 2010-06-09 NOTE — Discharge Summary (Signed)
NAMEKEATEN, MASHEK NO.:  [id number]   MEDICAL RECORD NO.:  [id number]          PATIENT TYPE:  OBV   LOCATION:  3703                         FACILITY:  [facility]   PHYSICIAN:  Elmore Guise., M.D.DATE OF BIRTH:  07/05/1955   DATE OF ADMISSION:  12/28/2007  DATE OF DISCHARGE:  12/29/2007                               DISCHARGE SUMMARY   DISCHARGE DIAGNOSES:  1. Chest pain.  2. Normal coronary arteries with mild luminal irregularities by      cardiac catheterization.  3. Dyslipidemia (low HDL).   HISTORY OF PRESENT ILLNESS:  Mr. Cosma is a 55 year old African American  male who presented with increasing exertional dyspnea and chest  discomfort.   HOSPITAL COURSE:  The patient's hospital course was uncomplicated.  He  was placed on observation status.  He was admitted to telemetry  monitoring.  He underwent an echocardiogram on day of admission which  showed normal LV size and function with an EF of 55-60%.  He had no  significant valvular abnormalities.  Because of his symptoms, he was  referred for cardiac catheterization.  This showed normal appearing  coronary arteries with mild luminal irregularities.  The patient  tolerated the procedure well.  He had no postprocedure problems.  He is  now been up and ambulatory without any further chest pain.  His blood  work did show low HDL with normal LDL.  His blood pressure has been well-  controlled.  He will be discharged home today to continue the following  medications.  1. Aspirin 325 mg daily.  2. Niaspan 500 mg daily (the patient is to come by the office for      samples).  He should have routine post cath restrictions with no      heavy lifting or strenuous activities for the next 48 hours.  His      followup will be with Dr. Reyes Ivan at [facility] in 2      weeks.  Otherwise, he will keep his regular scheduled followup with      Dr. Buren Kos at [facility].      Elmore Guise., M.D.  Electronically Signed     TWK/MEDQ  D:  12/29/2007  T:  12/29/2007  Job:  [id number]   cc:   Kari Baars, M.D.

## 2010-10-29 LAB — CBC
HCT: 44.1 % (ref 39.0–52.0)
Hemoglobin: 15 g/dL (ref 13.0–17.0)
MCV: 94.7 fL (ref 78.0–100.0)
MCV: 96.7 fL (ref 78.0–100.0)
RBC: 4.64 MIL/uL (ref 4.22–5.81)
RBC: 4.66 MIL/uL (ref 4.22–5.81)
WBC: 8.8 10*3/uL (ref 4.0–10.5)
WBC: 9.4 10*3/uL (ref 4.0–10.5)

## 2010-10-29 LAB — POCT CARDIAC MARKERS
Myoglobin, poc: 103 ng/mL (ref 12–200)
Troponin i, poc: <0.05 ng/mL (ref 0.00–0.09)

## 2010-10-29 LAB — BASIC METABOLIC PANEL
BUN: 10 mg/dL (ref 6–23)
Calcium: 9.2 mg/dL (ref 8.4–10.5)
Calcium: 9.4 mg/dL (ref 8.4–10.5)
Chloride: 107 mEq/L (ref 96–112)
Creatinine, Ser: 0.98 mg/dL (ref 0.4–1.5)
Creatinine, Ser: 1.04 mg/dL (ref 0.4–1.5)
GFR calc Af Amer: >60 mL/min (ref >60)
GFR calc non Af Amer: >60 mL/min (ref >60)
GFR calc non Af Amer: >60 mL/min (ref >60)
Glucose, Bld: 94 mg/dL (ref 70–99)

## 2010-10-29 LAB — POCT I-STAT, CHEM 8
Creatinine, Ser: 1.3 mg/dL (ref 0.4–1.5)
Hemoglobin: 14.6 g/dL (ref 13.0–17.0)
Sodium: 140 mEq/L (ref 135–145)
TCO2: 22 mmol/L (ref 0–100)

## 2010-10-29 LAB — CARDIAC PANEL(CRET KIN+CKTOT+MB+TROPI)
CK, MB: 2.3 ng/mL (ref 0.3–4.0)
Relative Index: 0.7 (ref 0.0–2.5)
Total CK: 346 U/L — ABNORMAL HIGH (ref 7–232)
Troponin I: 0.01 ng/mL (ref 0.00–0.06)
Troponin I: 0.01 ng/mL (ref 0.00–0.06)

## 2010-10-29 LAB — PROTIME-INR
INR: 1 (ref 0.00–1.49)
Prothrombin Time: 13.9 seconds (ref 11.6–15.2)

## 2010-10-29 LAB — LIPID PANEL
Total CHOL/HDL Ratio: 5.3 RATIO
Triglycerides: 118 mg/dL (ref <150)
VLDL: 24 mg/dL (ref 0–40)

## 2010-10-29 LAB — TROPONIN I: Troponin I: 0.01 ng/mL (ref 0.00–0.06)

## 2010-10-29 LAB — CK TOTAL AND CKMB (NOT AT ARMC): Total CK: 450 U/L — ABNORMAL HIGH (ref 7–232)

## 2011-10-22 ENCOUNTER — Encounter: Payer: Self-pay | Admitting: Internal Medicine

## 2011-10-28 ENCOUNTER — Ambulatory Visit (AMBULATORY_SURGERY_CENTER): Payer: BC Managed Care – PPO | Admitting: *Deleted

## 2011-10-28 ENCOUNTER — Encounter: Payer: Self-pay | Admitting: Internal Medicine

## 2011-10-28 VITALS — Ht 72.0 in | Wt 255.0 lb

## 2011-10-28 DIAGNOSIS — Z1211 Encounter for screening for malignant neoplasm of colon: Secondary | ICD-10-CM

## 2011-10-28 MED ORDER — MOVIPREP 100 G PO SOLR: ORAL | Status: DC

## 2011-11-03 ENCOUNTER — Encounter: Payer: Self-pay | Admitting: Internal Medicine

## 2011-11-03 ENCOUNTER — Ambulatory Visit (AMBULATORY_SURGERY_CENTER): Payer: BC Managed Care – PPO | Admitting: Internal Medicine

## 2011-11-03 VITALS — BP 138/86 | HR 65 | Temp 97.4°F | Resp 13 | Ht 72.0 in | Wt 255.0 lb

## 2011-11-03 DIAGNOSIS — D126 Benign neoplasm of colon, unspecified: Secondary | ICD-10-CM

## 2011-11-03 DIAGNOSIS — Z1211 Encounter for screening for malignant neoplasm of colon: Secondary | ICD-10-CM

## 2011-11-03 MED ORDER — SODIUM CHLORIDE 0.9 % IV SOLN: 500.0000 mL | INTRAVENOUS | Status: DC

## 2011-11-03 NOTE — Progress Notes (Signed)
No complaints noted in the recovery room. Maw   

## 2011-11-03 NOTE — Patient Instructions (Addendum)
Handouts were given to your care partner on polyps, diverticulosis, and high fiber diet.  You may resume your current medications today.  Please call if any questions or concerns.    YOU HAD AN ENDOSCOPIC PROCEDURE TODAY AT THE [facility]: Refer to the procedure report that was given to you for any specific questions about what was found during the examination.  If the procedure report does not answer your questions, please call your gastroenterologist to clarify.  If you requested that your care partner not be given the details of your procedure findings, then the procedure report has been included in a sealed envelope for you to review at your convenience later.  YOU SHOULD EXPECT: Some feelings of bloating in the abdomen. Passage of more gas than usual.  Walking can help get rid of the air that was put into your GI tract during the procedure and reduce the bloating. If you had a lower endoscopy (such as a colonoscopy or flexible sigmoidoscopy) you may notice spotting of blood in your stool or on the toilet paper. If you underwent a bowel prep for your procedure, then you may not have a normal bowel movement for a few days.  DIET: Your first meal following the procedure should be a light meal and then it is ok to progress to your normal diet.  A half-sandwich or bowl of soup is an example of a good first meal.  Heavy or fried foods are harder to digest and may make you feel nauseous or bloated.  Likewise meals heavy in dairy and vegetables can cause extra gas to form and this can also increase the bloating.  Drink plenty of fluids but you should avoid alcoholic beverages for 24 hours.  ACTIVITY: Your care partner should take you home directly after the procedure.  You should plan to take it easy, moving slowly for the rest of the day.  You can resume normal activity the day after the procedure however you should NOT DRIVE or use heavy machinery for 24 hours (because of the sedation  medicines used during the test).    SYMPTOMS TO REPORT IMMEDIATELY: A gastroenterologist can be reached at any hour.  During normal business hours, 8:30 AM to 5:00 PM Monday through Friday, call [phone].  After hours and on weekends, please call the GI answering service at [phone] who will take a message and have the physician on call contact you.   Following lower endoscopy (colonoscopy or flexible sigmoidoscopy):  Excessive amounts of blood in the stool  Significant tenderness or worsening of abdominal pains  Swelling of the abdomen that is new, acute  Fever of 100F or higher    FOLLOW UP: If any biopsies were taken you will be contacted by phone or by letter within the next 1-3 weeks.  Call your gastroenterologist if you have not heard about the biopsies in 3 weeks.  Our staff will call the home number listed on your records the next business day following your procedure to check on you and address any questions or concerns that you may have at that time regarding the information given to you following your procedure. This is a courtesy call and so if there is no answer at the home number and we have not heard from you through the emergency physician on call, we will assume that you have returned to your regular daily activities without incident.  SIGNATURES/CONFIDENTIALITY: You and/or your care partner have signed paperwork which will be entered into  your electronic medical record.  These signatures attest to the fact that that the information above on your After Visit Summary has been reviewed and is understood.  Full responsibility of the confidentiality of this discharge information lies with you and/or your care-partner.

## 2011-11-03 NOTE — Op Note (Signed)
[facility] [address] [place], [address]   COLONOSCOPY PROCEDURE REPORT  PATIENT: [name], [name]  MR#: [id number] BIRTHDATE: 04-Aug-1955 , 56  yrs. old GENDER: Male ENDOSCOPIST: Hart Carwin, MD REFERRED BY:  Kari Baars, M.D. PROCEDURE DATE:  11/03/2011 PROCEDURE:   Colonoscopy with cold biopsy polypectomy ASA CLASS:   Class II INDICATIONS:average risk patient for colon cancer. MEDICATIONS: MAC sedation, administered by CRNA and Propofol (Diprivan) 250 mg IV  DESCRIPTION OF PROCEDURE:   After the risks and benefits and of the procedure were explained, informed consent was obtained.  A digital rectal exam revealed no abnormalities of the rectum.    The LB [id number]  endoscope was introduced through the anus and advanced to the cecum, which was identified by both the appendix and ileocecal valve .  The quality of the prep was good, using MoviPrep .  The instrument was then slowly withdrawn as the colon was fully examined.     COLON FINDINGS: Four diminutive smooth sessile polyps were found in the sigmoid colon.  A polypectomy was performed with cold forceps. The resection was complete and the polyp tissue was completely retrieved.   Mild diverticulosis was noted in the sigmoid colon. Retroflexed views revealed no abnormalities.     The scope was then withdrawn from the patient and the procedure completed.  COMPLICATIONS: There were no complications. ENDOSCOPIC IMPRESSION: 1.   Four diminutive sessile polyps were found in the sigmoid colonat 30 cm; polypectomy was performed with cold forceps 2.   Mild diverticulosis was noted in the sigmoid colon  RECOMMENDATIONS: 1.  await pathology results 2.  High fiber diet   REPEAT EXAM: In 10 year(s)  for Colonoscopy.  cc:  _______________________________ eSignedHart Carwin, MD 11/03/2011 10:29 AM     PATIENT NAME:  [name], [name] MR#: [id number]

## 2011-11-03 NOTE — Progress Notes (Signed)
Patient did not experience any of the following events: a burn prior to discharge; a fall within the facility; wrong site/side/patient/procedure/implant event; or a hospital transfer or hospital admission upon discharge from the facility. (G8907) Patient did not have preoperative order for IV antibiotic SSI prophylaxis. (G8918)  

## 2011-11-04 ENCOUNTER — Telehealth: Payer: Self-pay | Admitting: *Deleted

## 2011-11-04 NOTE — Telephone Encounter (Signed)
  Follow up Call-  Call back number 11/03/2011  Post procedure Call Back phone  # [phone]  Permission to leave phone message Yes     Patient questions:  Do you have a fever, pain , or abdominal swelling? no Pain Score  0 *  Have you tolerated food without any problems? yes  Have you been able to return to your normal activities? yes  Do you have any questions about your discharge instructions: Diet   no Medications  no Follow up visit  no  Do you have questions or concerns about your Care? no  Actions: * If pain score is 4 or above: No action needed, pain <4.

## 2011-11-08 ENCOUNTER — Encounter: Payer: Self-pay | Admitting: Internal Medicine

## 2012-09-15 ENCOUNTER — Telehealth: Payer: Self-pay

## 2012-09-15 ENCOUNTER — Ambulatory Visit: Payer: BC Managed Care – PPO | Admitting: Family Medicine

## 2012-09-15 NOTE — Telephone Encounter (Signed)
Called and left message pt missed new patient appointment

## 2013-02-19 ENCOUNTER — Other Ambulatory Visit: Payer: Self-pay | Admitting: Internal Medicine

## 2013-02-19 DIAGNOSIS — B182 Chronic viral hepatitis C: Secondary | ICD-10-CM

## 2013-02-20 ENCOUNTER — Ambulatory Visit
Admission: RE | Admit: 2013-02-20 | Discharge: 2013-02-20 | Disposition: A | Discharge status: Home / Self Care | Payer: BC Managed Care – PPO | Source: Ambulatory Visit | Attending: Internal Medicine | Admitting: Internal Medicine

## 2013-02-20 DIAGNOSIS — B182 Chronic viral hepatitis C: Secondary | ICD-10-CM

## 2013-02-20 IMAGING — US US ABDOMEN LIMITED
1 series · 14 of 25 positions shown · non-contrast
Comparison: [DATE]

CLINICAL DATA: Patient reports chronic history of hepatitis-C

EXAM:
US ABDOMEN LIMITED - RIGHT UPPER QUADRANT

[Series 1: us abdomen limited · 0.31mm/px · 14 of 44 slices shown]
[im 1/44]
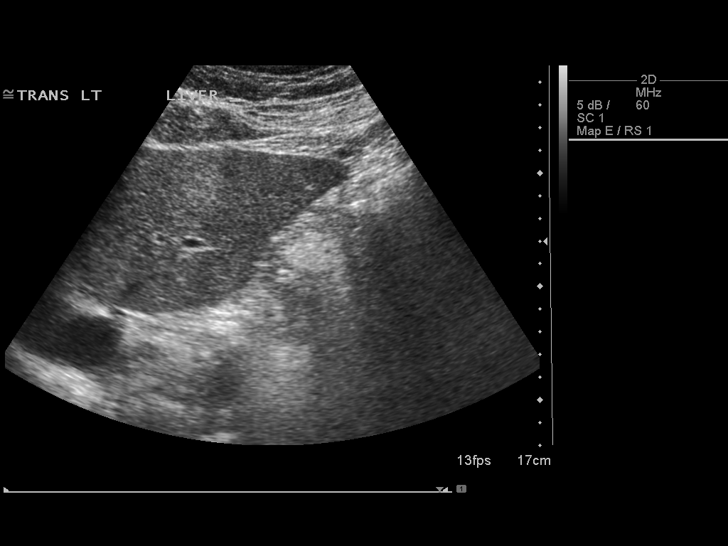
[im 4/44]
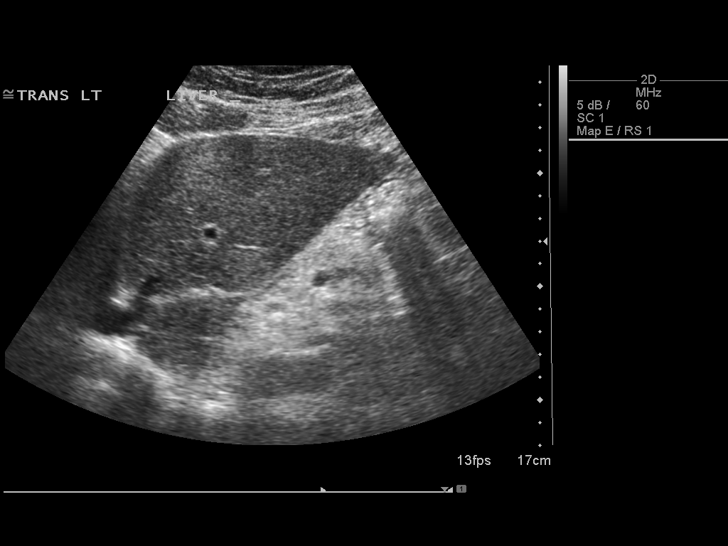
[im 8/44]
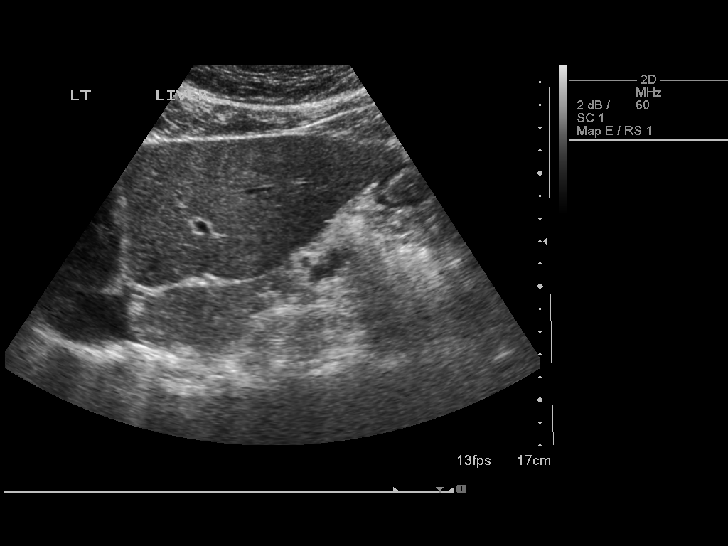
[im 11/44]
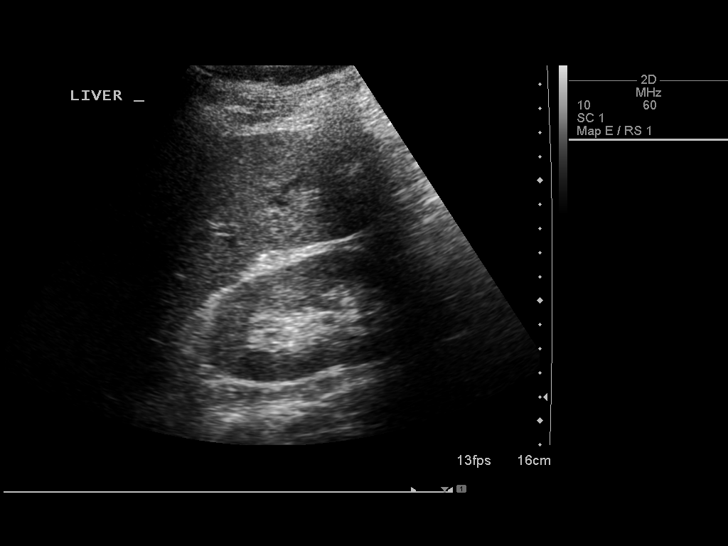
[im 15/44]
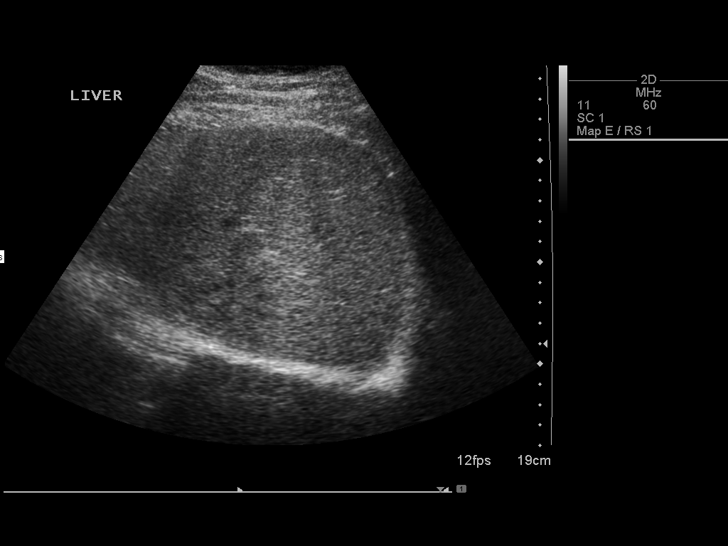
[im 17/44]
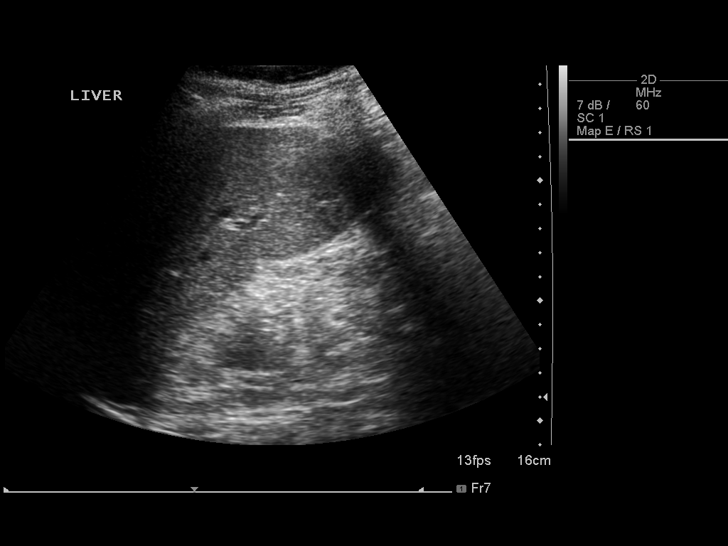
[im 20/44]
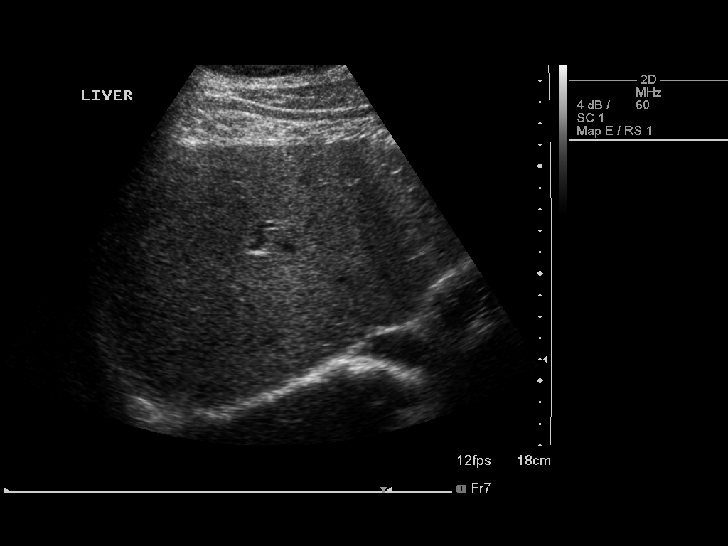
[im 24/44]
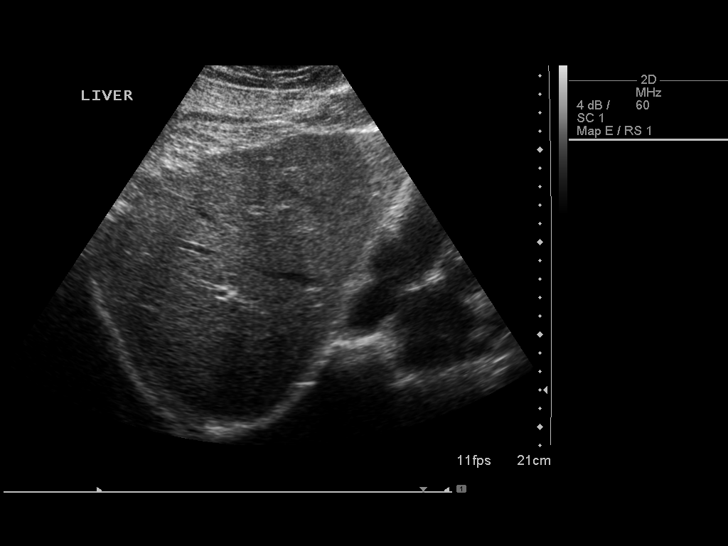
[im 27/44]
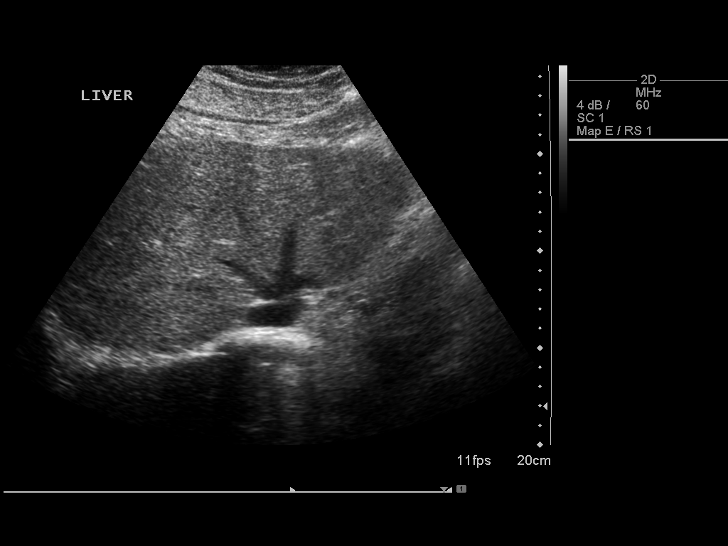
[im 29/44]
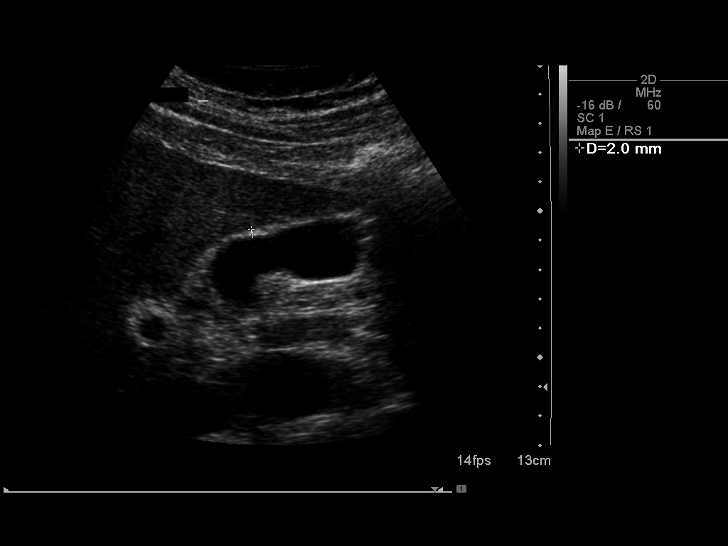
[im 33/44]
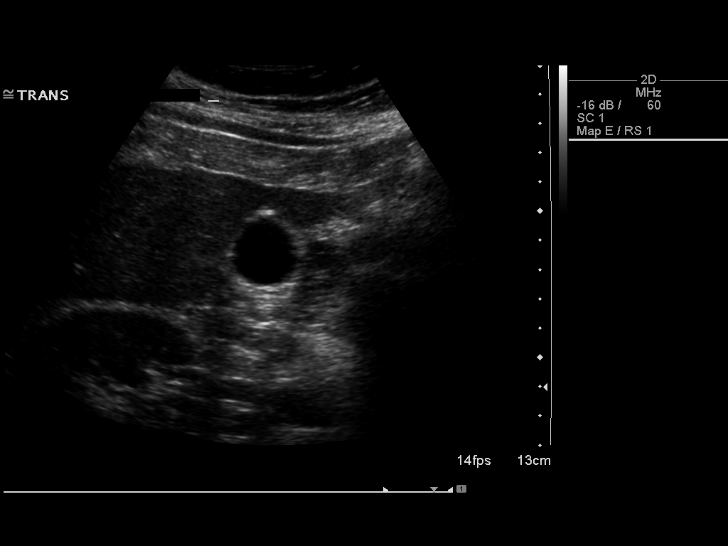
[im 36/44]
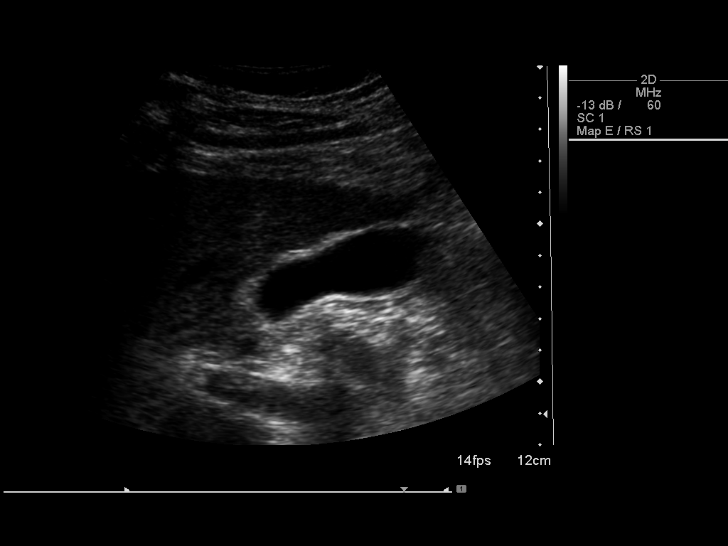
[im 40/44]
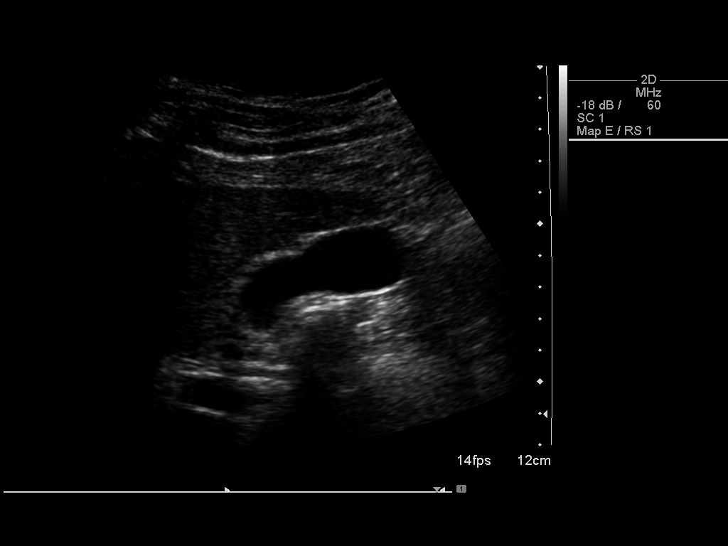
[im 44/44]
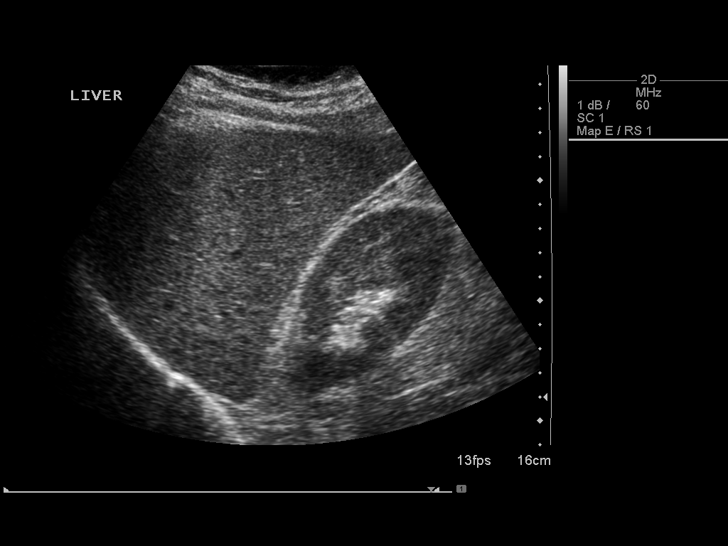

[14 of 25 positions shown; findings below may reference images not displayed]

FINDINGS: Gallbladder:

No gallstones or wall thickening visualized. No sonographic Murphy
sign noted.

Common bile duct:

Diameter: 2.7 mm

Liver:

Mildly coarsened echotexture bilaterally, not significantly
different from prior study. No focal abnormalities.
IMPRESSION: No acute findings. Mild nonspecific hepatic echotexture coarsening.
This would be consistent with hepatic parenchymal disease and could
be seen with hepatitis as well as with more common causes such as
fatty infiltration.

## 2014-07-11 ENCOUNTER — Encounter (HOSPITAL_COMMUNITY): Payer: Self-pay

## 2014-07-11 ENCOUNTER — Emergency Department (HOSPITAL_COMMUNITY)
Admission: EM | Admit: 2014-07-11 | Discharge: 2014-07-11 | Disposition: A | Discharge status: Home / Self Care | Payer: BLUE CROSS/BLUE SHIELD | Attending: Emergency Medicine | Admitting: Emergency Medicine

## 2014-07-11 ENCOUNTER — Emergency Department (HOSPITAL_COMMUNITY): Payer: BLUE CROSS/BLUE SHIELD

## 2014-07-11 DIAGNOSIS — Y929 Unspecified place or not applicable: Secondary | ICD-10-CM | POA: Diagnosis not present

## 2014-07-11 DIAGNOSIS — I10 Essential (primary) hypertension: Secondary | ICD-10-CM | POA: Insufficient documentation

## 2014-07-11 DIAGNOSIS — Y999 Unspecified external cause status: Secondary | ICD-10-CM | POA: Insufficient documentation

## 2014-07-11 DIAGNOSIS — X58XXXA Exposure to other specified factors, initial encounter: Secondary | ICD-10-CM | POA: Insufficient documentation

## 2014-07-11 DIAGNOSIS — Z87891 Personal history of nicotine dependence: Secondary | ICD-10-CM | POA: Insufficient documentation

## 2014-07-11 DIAGNOSIS — Z79899 Other long term (current) drug therapy: Secondary | ICD-10-CM | POA: Insufficient documentation

## 2014-07-11 DIAGNOSIS — Z7982 Long term (current) use of aspirin: Secondary | ICD-10-CM | POA: Insufficient documentation

## 2014-07-11 DIAGNOSIS — Z791 Long term (current) use of non-steroidal anti-inflammatories (NSAID): Secondary | ICD-10-CM | POA: Diagnosis not present

## 2014-07-11 DIAGNOSIS — S46001A Unspecified injury of muscle(s) and tendon(s) of the rotator cuff of right shoulder, initial encounter: Secondary | ICD-10-CM

## 2014-07-11 DIAGNOSIS — Y939 Activity, unspecified: Secondary | ICD-10-CM | POA: Insufficient documentation

## 2014-07-11 DIAGNOSIS — S4991XA Unspecified injury of right shoulder and upper arm, initial encounter: Secondary | ICD-10-CM | POA: Diagnosis present

## 2014-07-11 IMAGING — CR DG SHOULDER 2+V*R*
4 series · 4 of 4 positions shown · non-contrast
Comparison: None.

CLINICAL DATA: Increasing right shoulder pain beginning 1 week ago.
Radiates to the right upper chest. Tingling in the right arm.

EXAM:
RIGHT SHOULDER - 2+ VIEW

[w shoulder external right]
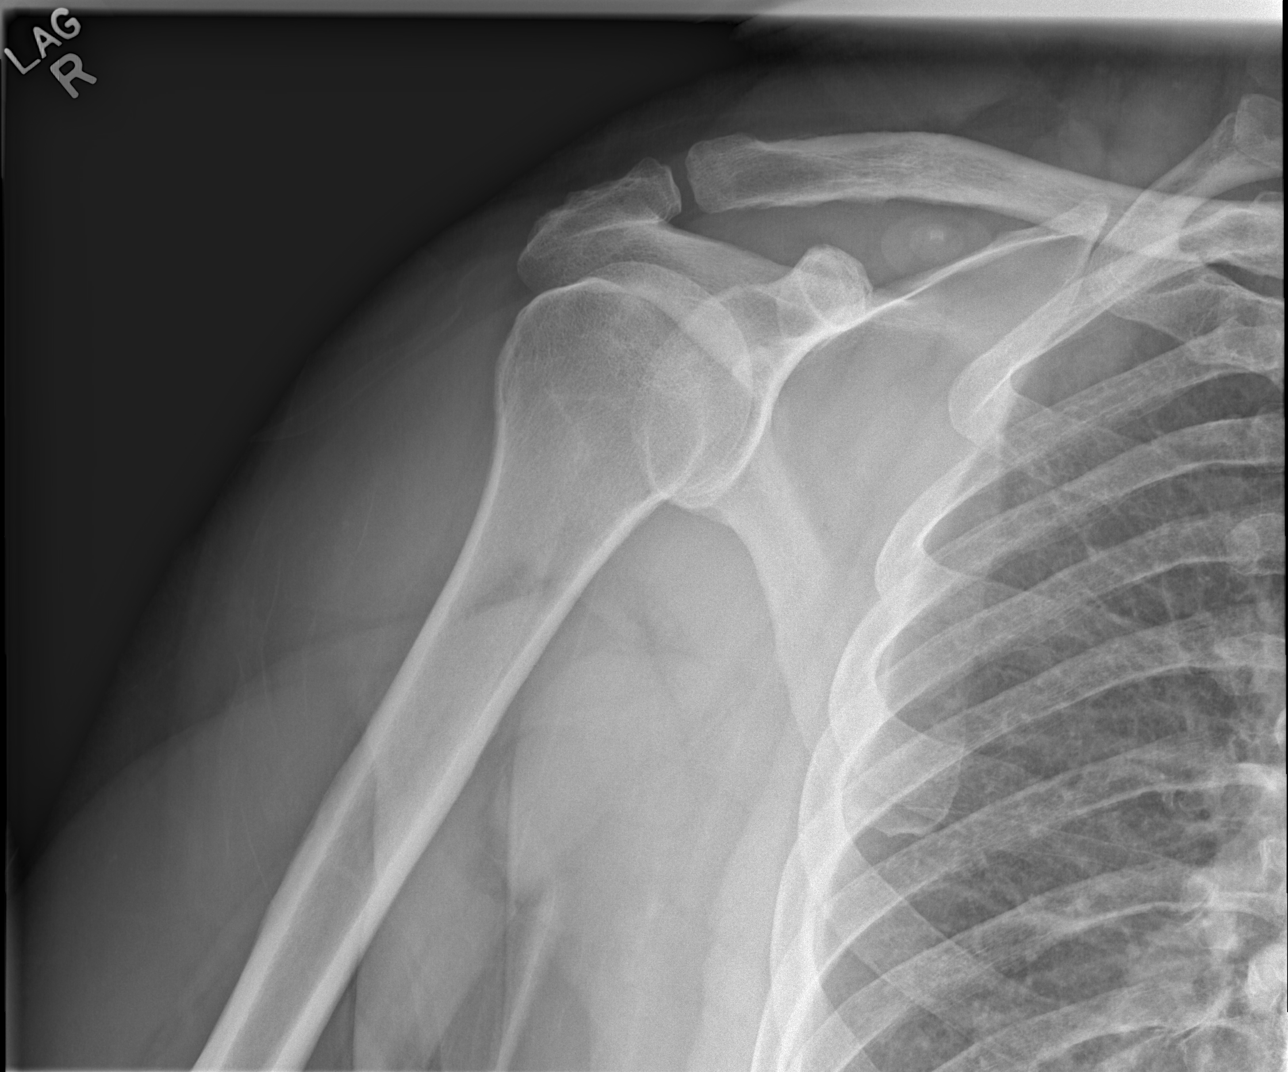

[w shoulder y-view right (1 of 2)]
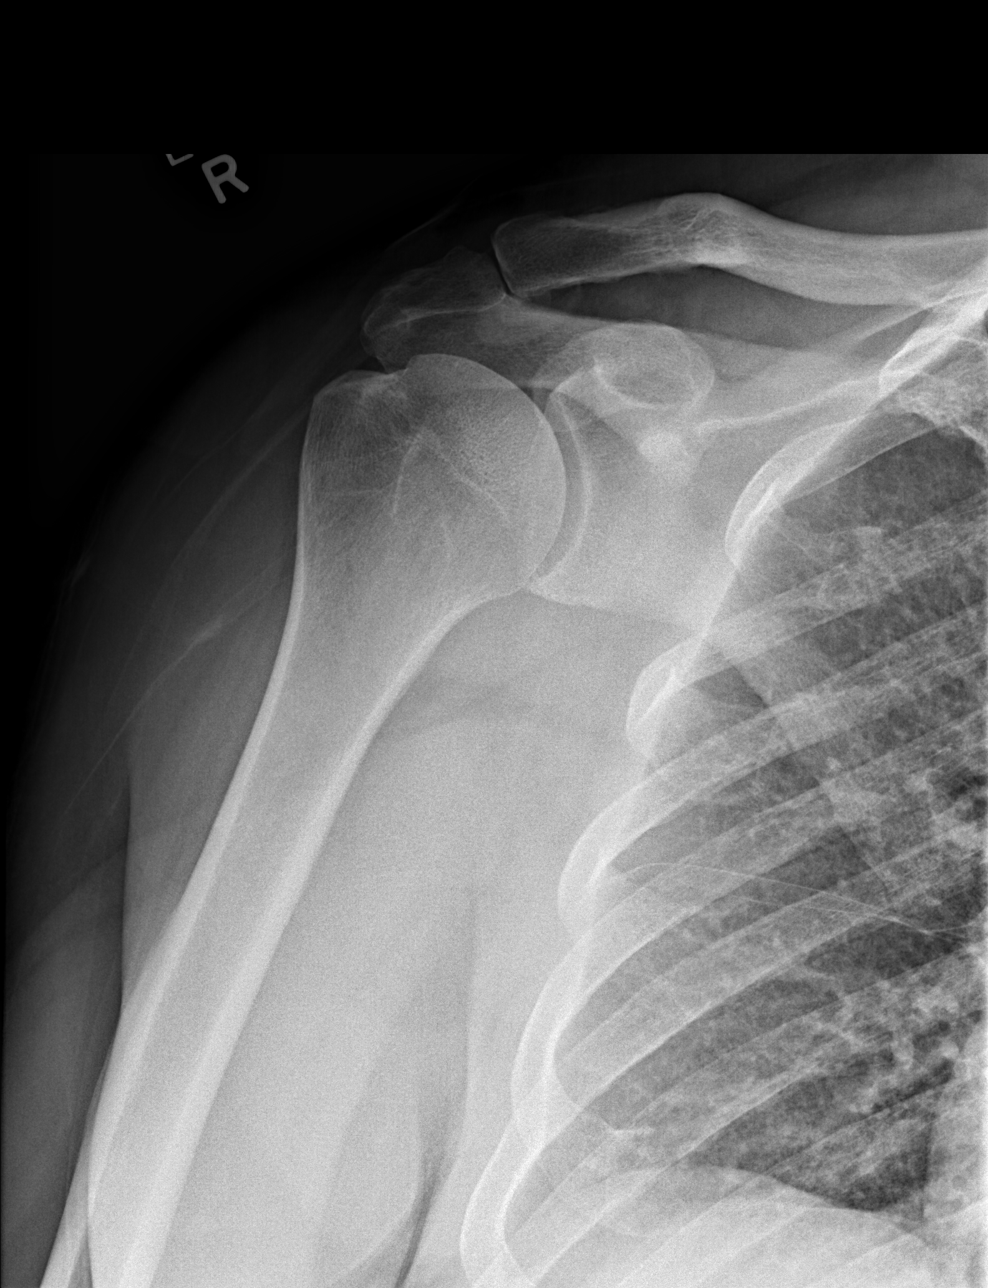

[w shoulder y-view right (2 of 2)]
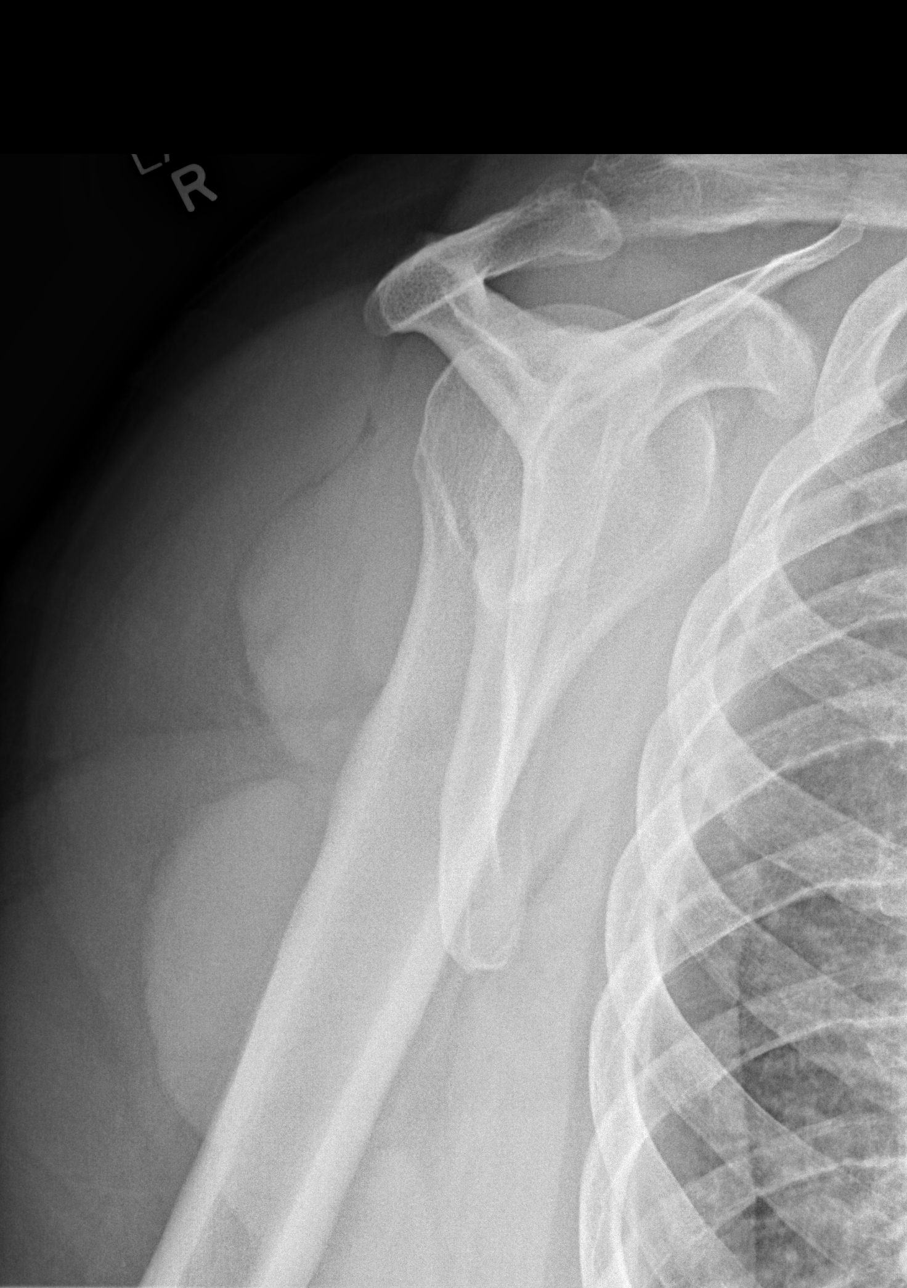

[x shoulder axillary right]
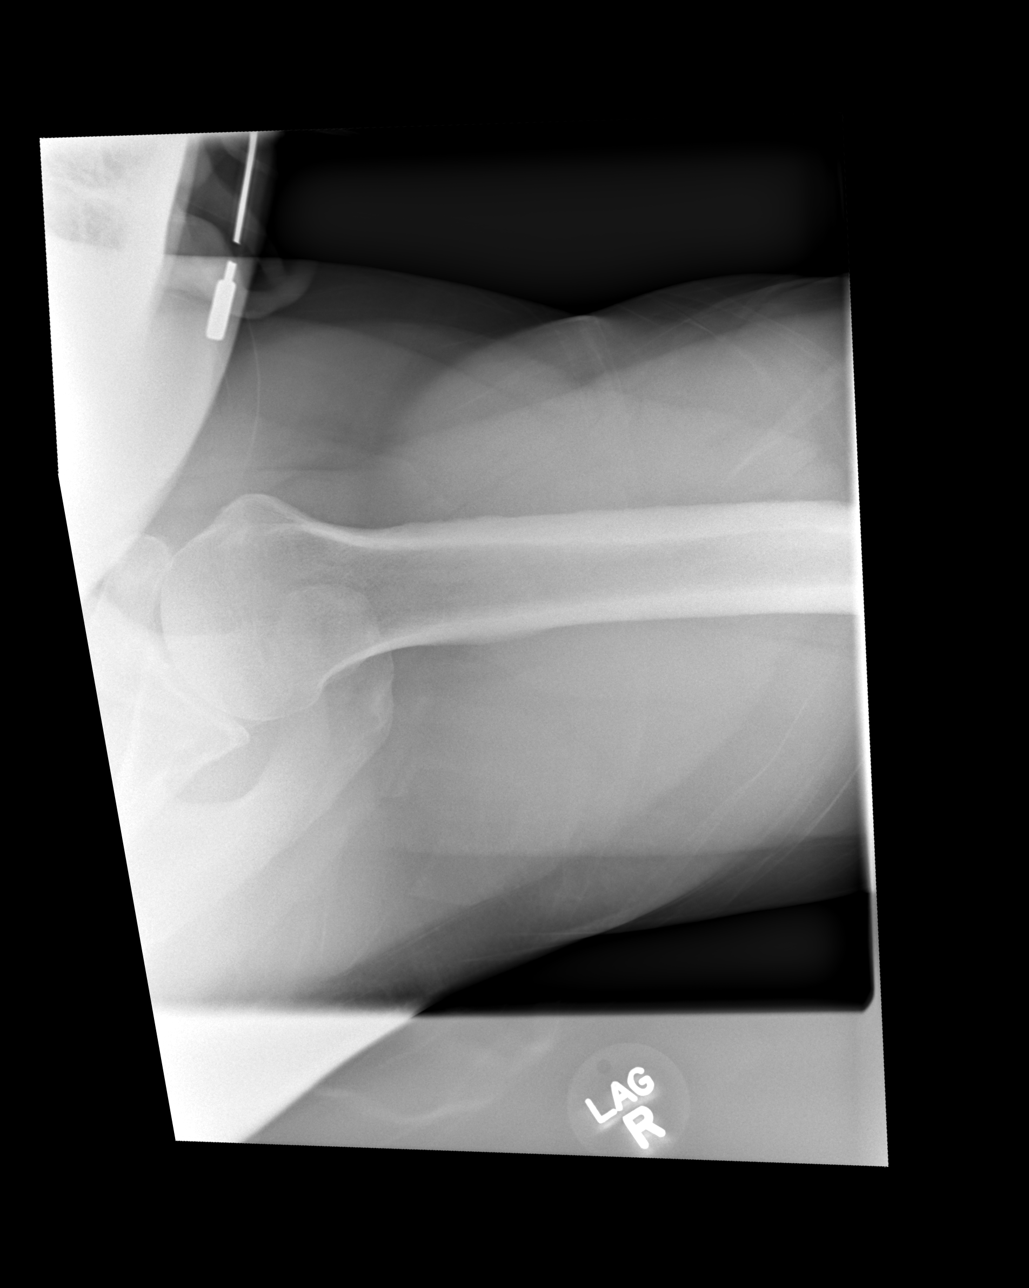

[4 of 4 positions shown; findings below may reference images not displayed]

FINDINGS: Subacromial spurring. Old appearing cortical defect in the lateral
humeral head may represent old Hill-Sachs deformity or impaction
fracture. No evidence of acute fracture or dislocation of the right
shoulder. Coracoclavicular and acromioclavicular spaces are
maintained. Soft tissues are unremarkable.
IMPRESSION: Subacromial spurring with old Hill-Sachs deformity or impaction
deformity in the lateral humeral head. No acute fractures
identified.

## 2014-07-11 MED ORDER — KETOROLAC TROMETHAMINE 60 MG/2ML IM SOLN
60.0000 mg | Freq: Once | INTRAMUSCULAR | Status: AC
  Administered 2014-07-11: 60 mg via INTRAMUSCULAR
  Filled 2014-07-11: qty 2

## 2014-07-11 MED ORDER — LIDOCAINE 5 % EX PTCH
3.0000 | MEDICATED_PATCH | Freq: Once | CUTANEOUS | Status: DC
  Administered 2014-07-11: 3 via TRANSDERMAL
  Filled 2014-07-11: qty 3

## 2014-07-11 MED ORDER — DEXAMETHASONE SODIUM PHOSPHATE 10 MG/ML IJ SOLN
10.0000 mg | Freq: Once | INTRAMUSCULAR | Status: AC
  Administered 2014-07-11: 10 mg via INTRAMUSCULAR
  Filled 2014-07-11: qty 1

## 2014-07-11 MED ORDER — HYDROCODONE-ACETAMINOPHEN 5-325 MG PO TABS
2.0000 | ORAL_TABLET | Freq: Once | ORAL | Status: AC
  Administered 2014-07-11: 2 via ORAL
  Filled 2014-07-11: qty 2

## 2014-07-11 NOTE — ED Notes (Addendum)
Patient reports right shoulder pain that began one week ago.  It occasionally radiates to right upper chest and causes tingling in his right arm.  He called his PMD this afternoon, who made an appointment for him tomorrow morning and advised him to visit the ER with worsening pain.

## 2014-07-11 NOTE — ED Provider Notes (Signed)
CSN: [id number]     Arrival date & time 07/11/14  0029 History   First MD Initiated Contact with Patient 07/11/14 [phone]     Chief Complaint  Patient presents with  . Shoulder Pain     (Consider location/radiation/quality/duration/timing/severity/associated sxs/prior Treatment) HPI  Mr. [name] is a 59 yo male PMH HTN, presenting today with R shoulder pain.  He states this began 1 week ago.  He has a prior history of heavy body building and playing sports and has had previous injury to the shoulder.  He has done nothing strenuous recently.  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  He denies h/o dislocations.  He also has numbness from the shoulder to the R forearm, not extending to the hands or fingers.  He has taken aleve without significant relief.  He has no further complaints.  10 Systems reviewed and are negative for acute change except as noted in the HPI.     Past Medical History  Diagnosis Date  . Hypertension    Past Surgical History  Procedure Laterality Date  . Cardiac catheterization  2008   Family History  Problem Relation Age of Onset  . Colon cancer Maternal Uncle   . Colon cancer Cousin    History  Substance Use Topics  . Smoking status: Former Smoker    Types: Cigarettes  . Smokeless tobacco: Never Used  . Alcohol Use: No    Review of Systems    Allergies  Review of patient's allergies indicates no known allergies.  Home Medications   Prior to Admission medications   Medication Sig Start Date End Date Taking? Authorizing Provider  aspirin 81 MG tablet Take 81 mg by mouth daily.   Yes Historical Provider, MD  lisinopril-hydrochlorothiazide (PRINZIDE,ZESTORETIC) 20-12.5 MG per tablet Take 1 tablet by mouth Daily. 09/30/11  Yes Historical Provider, MD  meloxicam (MOBIC) 7.5 MG tablet Take 7.5 mg by mouth daily.   Yes Historical Provider, MD  Multiple Vitamins-Minerals (MULTIVITAMIN WITH MINERALS) tablet Take 1 tablet by mouth  daily.   Yes Historical Provider, MD  [place] take as directed no substitution Patient not taking: Reported on 07/11/2014 10/28/11   Lafayette Dragon, MD   BP 156/83 mmHg  Pulse 83  Temp(Src) 97.6 F (36.4 C) (Oral)  Resp 18  Ht 5\' 11"  (1.803 m)  Wt 250 lb (113.399 kg)  BMI 34.88 kg/m2  SpO2 97% Physical Exam  Constitutional: He is oriented to person, place, and time. Vital signs are normal. He appears well-developed and well-nourished.  Non-toxic appearance. He does not appear ill. No distress.  HENT:  Head: Normocephalic and atraumatic.  Nose: Nose normal.  Mouth/Throat: Oropharynx is clear and moist. No oropharyngeal exudate.  Eyes: Conjunctivae and EOM are normal. Pupils are equal, round, and reactive to light. No scleral icterus.  Neck: Normal range of motion. Neck supple. No tracheal deviation, no edema, no erythema and normal range of motion present. No thyroid mass and no thyromegaly present.  Cardiovascular: Normal rate, regular rhythm, S1 normal, S2 normal, normal heart sounds, intact distal pulses and normal pulses.  Exam reveals no gallop and no friction rub.   No murmur heard. Pulses:      Radial pulses are 2+ on the right side, and 2+ on the left side.       Dorsalis pedis pulses are 2+ on the right side, and 2+ on the left side.  Pulmonary/Chest: Effort normal  and breath sounds normal. No respiratory distress. He has no wheezes. He has no rhonchi. He has no rales.  Abdominal: Soft. Normal appearance and bowel sounds are normal. He exhibits no distension, no ascites and no mass. There is no hepatosplenomegaly. There is no tenderness. There is no rebound, no guarding and no CVA tenderness.  Musculoskeletal: Normal range of motion. He exhibits no edema or tenderness.  Full ROM of the R shoulder, no obvious deformities, no pulse and sensation distally  Lymphadenopathy:    He has no cervical adenopathy.  Neurological: He is alert and oriented to person,  place, and time. He has normal strength. No cranial nerve deficit or sensory deficit.  Skin: Skin is warm, dry and intact. No petechiae and no rash noted. He is not diaphoretic. No erythema. No pallor.  Psychiatric: He has a normal mood and affect. His behavior is normal. Judgment normal.  Nursing note and vitals reviewed.   ED Course  Procedures (including critical care time) Labs Review Labs Reviewed - No data to display  Imaging Review Dg Shoulder Right  07/11/2014   CLINICAL DATA:  Increasing right shoulder pain beginning 1 week ago. Radiates to the right upper chest. Tingling in the right arm.  EXAM: RIGHT SHOULDER - 2+ VIEW  COMPARISON:  None.  FINDINGS: Subacromial spurring. Old appearing cortical defect in the lateral humeral head may represent old Hill-Sachs deformity or impaction fracture. No evidence of acute fracture or dislocation of the right shoulder. Coracoclavicular and acromioclavicular spaces are maintained. Soft tissues are unremarkable.  IMPRESSION: Subacromial spurring with old Hill-Sachs deformity or impaction deformity in the lateral humeral head. No acute fractures identified.   Electronically Signed   By: Lucienne Capers M.D.   On: 07/11/2014 01:46     EKG Interpretation None      MDM   Final diagnoses:  None    Patient presents to the ED for R shoulder pain.  He was informed for old hill-sachs deformity.  Patient likely has rotator cuff injury from his past heavy activity catching up to him now.  He has no new trauma in the area. He was given toradol, decadron, and norco in the ED for pain control.  He is not requesting narcotics to go home with as he is a Administrator.  Will DC home with scheduled motrin, lidocaine patches and RICE therapy.     Everlene Balls, MD 07/11/14 [phone]

## 2014-07-11 NOTE — ED Notes (Signed)
EKG given to EDP,Oni,MD. For review.

## 2014-07-11 NOTE — Discharge Instructions (Signed)
Rotator Cuff Tendinitis  [name], you likely have a rotator cuff injury.  You will need an MRI to uncover the specific problem. Take motrin 800mg  ever 8 hours as needed for your pain.  Also make sure you rest your arm, use ice packs 3 times a day for 15 min. And keep the arm elevated as often as possible.  You can also use lidocaine patches for pain.  See your primary doctor within 3 days for continued care.  If symptoms worsen, come back to the ED immediately.  Thank you. Rotator cuff tendinitis is inflammation of the tough, cord-like bands that connect muscle to bone (tendons) in your rotator cuff. Your rotator cuff is the collection of all the muscles and tendons that connect your arm to your shoulder. Your rotator cuff holds the head of your upper arm bone (humerus) in the cup (fossa) of your shoulder blade (scapula). CAUSES Rotator cuff tendinitis is usually caused by overusing the joint involved.  SIGNS AND SYMPTOMS Deep ache in the shoulder also felt on the outside upper arm over the shoulder muscle. Point tenderness over the area that is injured. Pain comes on gradually and becomes worse with lifting the arm to the side (abduction) or turning it inward (internal rotation). May lead to a chronic tear: When a rotator cuff tendon becomes inflamed, it runs the risk of losing its blood supply, causing some tendon fibers to die. This increases the risk that the tendon can fray and partially or completely tear. DIAGNOSIS Rotator cuff tendinitis is diagnosed by taking a medical history, performing a physical exam, and reviewing results of imaging exams. The medical history is useful to help determine the type of rotator cuff injury. The physical exam will include looking at the injured shoulder, feeling the injured area, and watching you do range-of-motion exercises. X-ray exams are typically done to rule out other causes of shoulder pain, such as fractures. MRI is the imaging exam usually used for  significant shoulder injuries. Sometimes a dye study called CT arthrogram is done, but it is not as widely used as MRI. In some institutions, special ultrasound tests may also be used to aid in the diagnosis. TREATMENT  Less Severe Cases Use of a sling to rest the shoulder for a short period of time. Prolonged use of the sling can cause stiffness, weakness, and loss of motion of the shoulder joint. Anti-inflammatory medicines, such as ibuprofen or naproxen sodium, may be prescribed. More Severe Cases Physical therapy. Use of steroid injections into the shoulder joint. Surgery. HOME CARE INSTRUCTIONS  Use a sling or splint until the pain decreases. Prolonged use of the sling can cause stiffness, weakness, and loss of motion of the shoulder joint. Apply ice to the injured area: Put ice in a plastic bag. Place a towel between your skin and the bag. Leave the ice on for 20 minutes, 2-3 times a day. Try to avoid use other than gentle range of motion while your shoulder is painful. Use the shoulder and exercise only as directed by your health care provider. Stop exercises or range of motion if pain or discomfort increases, unless directed otherwise by your health care provider. Only take over-the-counter or prescription medicines for pain, discomfort, or fever as directed by your health care provider. If you were given a shoulder sling and straps (immobilizer), do not remove it except as directed, or until you see a health care provider for a follow-up exam. If you need to remove it, move your arm  as little as possible or as directed. You may want to sleep on several pillows at night to lessen swelling and pain. SEEK IMMEDIATE MEDICAL CARE IF:  Your shoulder pain increases or new pain develops in your arm, hand, or fingers and is not relieved with medicines. You have new, unexplained symptoms, especially increased numbness in the hands or loss of strength. You develop any worsening of the problems  that brought you in for care. Your arm, hand, or fingers are numb or tingling. Your arm, hand, or fingers are swollen, painful, or turn white or blue. MAKE SURE YOU: Understand these instructions. Will watch your condition. Will get help right away if you are not doing well or get worse. Document Released: 04/03/2003 Document Revised: 11/01/2012 Document Reviewed: 08/23/2012 [facility] Patient Information 2015 [place], [place]. This information is not intended to replace advice given to you by your health care provider. Make sure you discuss any questions you have with your health care provider. Rotator Cuff Injury Rotator cuff injury is any type of injury to the set of muscles and tendons that make up the stabilizing unit of your shoulder. This unit holds the ball of your upper arm bone (humerus) in the socket of your shoulder blade (scapula).  CAUSES Injuries to your rotator cuff most commonly come from sports or activities that cause your arm to be moved repeatedly over your head. Examples of this include throwing, weight lifting, swimming, or racquet sports. Long lasting (chronic) irritation of your rotator cuff can cause soreness and swelling (inflammation), bursitis, and eventual damage to your tendons, such as a tear (rupture). SIGNS AND SYMPTOMS Acute rotator cuff tear:  Sudden tearing sensation followed by severe pain shooting from your upper shoulder down your arm toward your elbow.  Decreased range of motion of your shoulder because of pain and muscle spasm.  Severe pain.  Inability to raise your arm out to the side because of pain and loss of muscle power (large tears). Chronic rotator cuff tear:  Pain that usually is worse at night and may interfere with sleep.  Gradual weakness and decreased shoulder motion as the pain worsens.  Decreased range of motion. Rotator cuff tendinitis:  Deep ache in your shoulder and the outside upper arm over your shoulder.  Pain that comes  on gradually and becomes worse when lifting your arm to the side or turning it inward. DIAGNOSIS Rotator cuff injury is diagnosed through a medical history, physical exam, and imaging exam. The medical history helps determine the type of rotator cuff injury. Your health care provider will look at your injured shoulder, feel the injured area, and ask you to move your shoulder in different positions. X-ray exams typically are done to rule out other causes of shoulder pain, such as fractures. MRI is the exam of choice for the most severe shoulder injuries because the images show muscles and tendons.  TREATMENT  Chronic tear:  Medicine for pain, such as acetaminophen or ibuprofen.  Physical therapy and range-of-motion exercises may be helpful in maintaining shoulder function and strength.  Steroid injections into your shoulder joint.  Surgical repair of the rotator cuff if the injury does not heal with noninvasive treatment. Acute tear:  Anti-inflammatory medicines such as ibuprofen and naproxen to help reduce pain and swelling.  A sling to help support your arm and rest your rotator cuff muscles. Long-term use of a sling is not advised. It may cause significant stiffening of the shoulder joint.  Surgery may be considered within a  few weeks, especially in younger, active people, to return the shoulder to full function.  Indications for surgical treatment include the following:  Age younger than 14 years.  Rotator cuff tears that are complete.  Physical therapy, rest, and anti-inflammatory medicines have been used for 6-8 weeks, with no improvement.  Employment or sporting activity that requires constant shoulder use. Tendinitis:  Anti-inflammatory medicines such as ibuprofen and naproxen to help reduce pain and swelling.  A sling to help support your arm and rest your rotator cuff muscles. Long-term use of a sling is not advised. It may cause significant stiffening of the shoulder  joint.  Severe tendinitis may require:  Steroid injections into your shoulder joint.  Physical therapy.  Surgery. HOME CARE INSTRUCTIONS   Apply ice to your injury:  Put ice in a plastic bag.  Place a towel between your skin and the bag.  Leave the ice on for 20 minutes, 2-3 times a day.  If you have a shoulder immobilizer (sling and straps), wear it until told otherwise by your health care provider.  You may want to sleep on several pillows or in a recliner at night to lessen swelling and pain.  Only take over-the-counter or prescription medicines for pain, discomfort, or fever as directed by your health care provider.  Do simple hand squeezing exercises with a soft rubber ball to decrease hand swelling. SEEK MEDICAL CARE IF:   Your shoulder pain increases, or new pain or numbness develops in your arm, hand, or fingers.  Your hand or fingers are colder than your other hand. SEEK IMMEDIATE MEDICAL CARE IF:   Your arm, hand, or fingers are numb or tingling.  Your arm, hand, or fingers are increasingly swollen and painful, or they turn white or blue. MAKE SURE YOU:  Understand these instructions.  Will watch your condition.  Will get help right away if you are not doing well or get worse. Document Released: 01/09/2000 Document Revised: 01/16/2013 Document Reviewed: 08/23/2012 [facility] Patient Information 2015 [place], [place]. This information is not intended to replace advice given to you by your health care provider. Make sure you discuss any questions you have with your health care provider. RICE: Routine Care for Injuries Rest, Ice, Compression, and Elevation (RICE) are often used to care for injuries. HOME CARE  Rest your injury.  Put ice on the injury.  Put ice in a plastic bag.  Place a towel between your skin and the bag.  Leave the ice on for 15-20 minutes, 03-04 times a day. Do this for as long as told by your doctor.  Apply pressure (compression)  with an elastic bandage. Remove and reapply the bandage every 3 to 4 hours. Do not wrap the bandage too tight. Wrap the bandage looser if the fingers or toes are puffy (swollen), blue, cold, painful, or lose feeling (numb).  Raise (elevate) your injury. Raise your injury above the heart if you can. GET HELP RIGHT AWAY IF:  You have lasting pain or puffiness.  Your injury is red, weak, or loses feeling.  Your problems get worse, not better, after several days. MAKE SURE YOU:  Understand these instructions.  Will watch your condition.  Will get help right away if you are not doing well or get worse. Document Released: 06/30/2007 Document Revised: 04/05/2011 Document Reviewed: 06/12/2010 [facility] Patient Information 2015 [place], [place]. This information is not intended to replace advice given to you by your health care provider. Make sure you discuss any questions you have  with your health care provider. ° °

## 2015-04-30 DIAGNOSIS — R079 Chest pain, unspecified: Secondary | ICD-10-CM | POA: Diagnosis not present

## 2015-04-30 DIAGNOSIS — S3991XA Unspecified injury of abdomen, initial encounter: Secondary | ICD-10-CM | POA: Diagnosis not present

## 2015-04-30 DIAGNOSIS — S0990XA Unspecified injury of head, initial encounter: Secondary | ICD-10-CM | POA: Diagnosis not present

## 2015-05-08 ENCOUNTER — Other Ambulatory Visit: Payer: Self-pay | Admitting: Occupational Medicine

## 2015-05-08 ENCOUNTER — Ambulatory Visit: Payer: Self-pay

## 2015-05-08 DIAGNOSIS — M542 Cervicalgia: Secondary | ICD-10-CM

## 2015-05-08 DIAGNOSIS — M79662 Pain in left lower leg: Secondary | ICD-10-CM

## 2015-05-08 DIAGNOSIS — M549 Dorsalgia, unspecified: Secondary | ICD-10-CM
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
## 2015-05-08 IMAGING — CR DG LUMBAR SPINE COMPLETE 4+V
5 series · 5 of 5 positions shown · non-contrast
Comparison: None.

CLINICAL DATA: Low back pain

EXAM:
LUMBAR SPINE - COMPLETE 4+ VIEW

[view not recorded (1 of 5)]
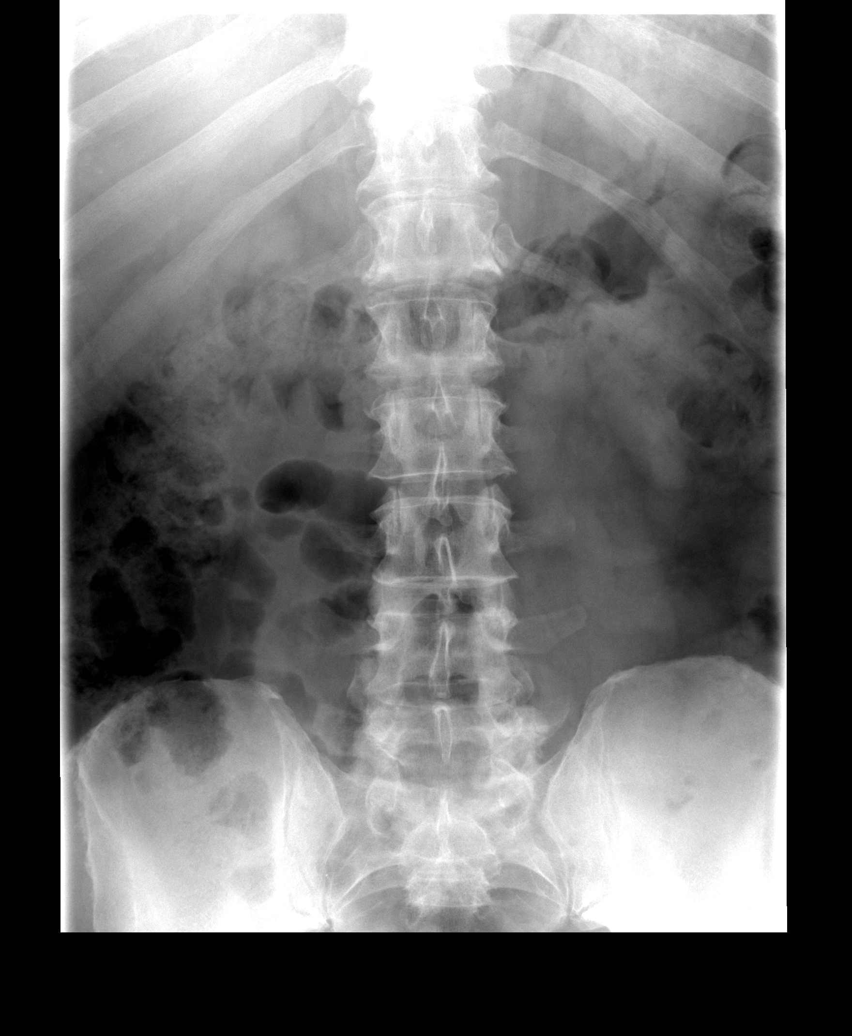

[view not recorded (2 of 5)]
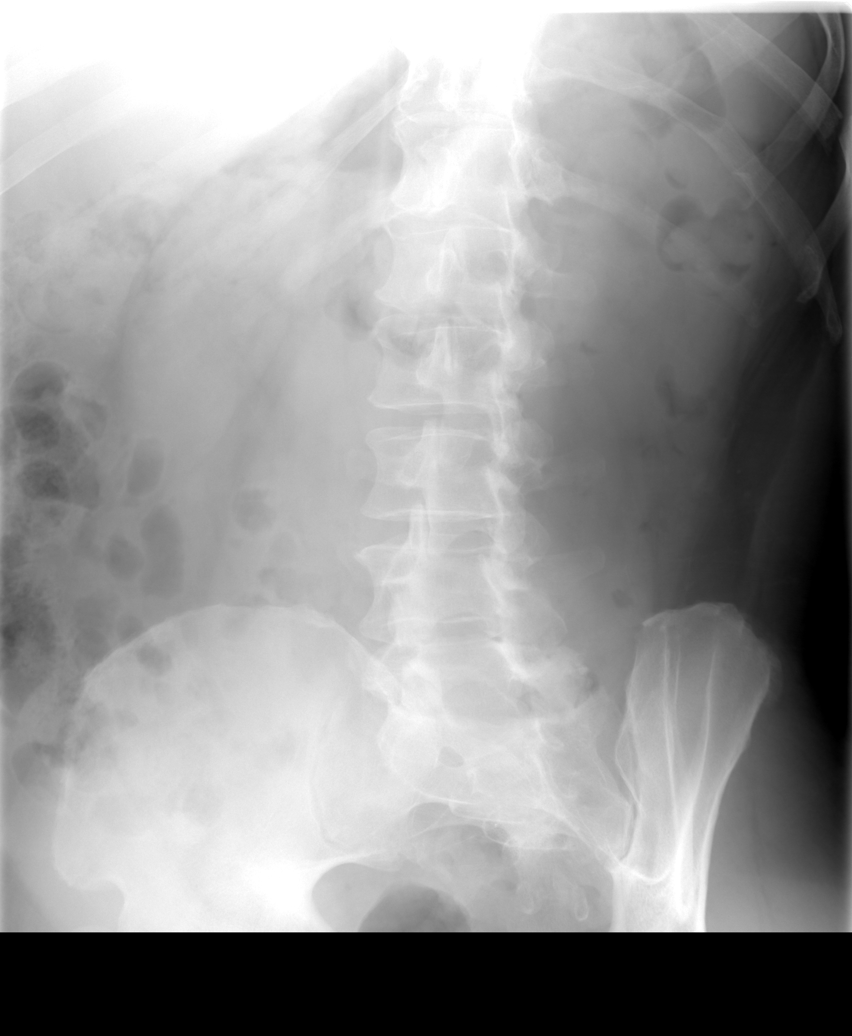

[view not recorded (3 of 5)]
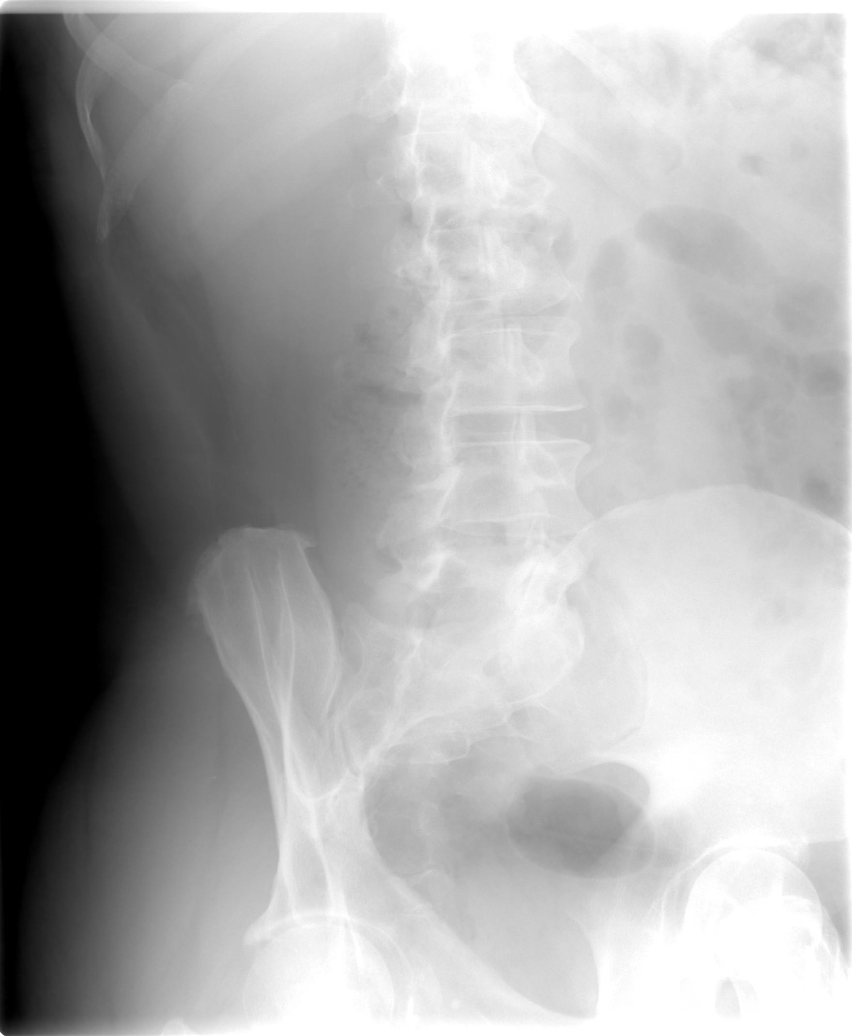

[view not recorded (4 of 5)]
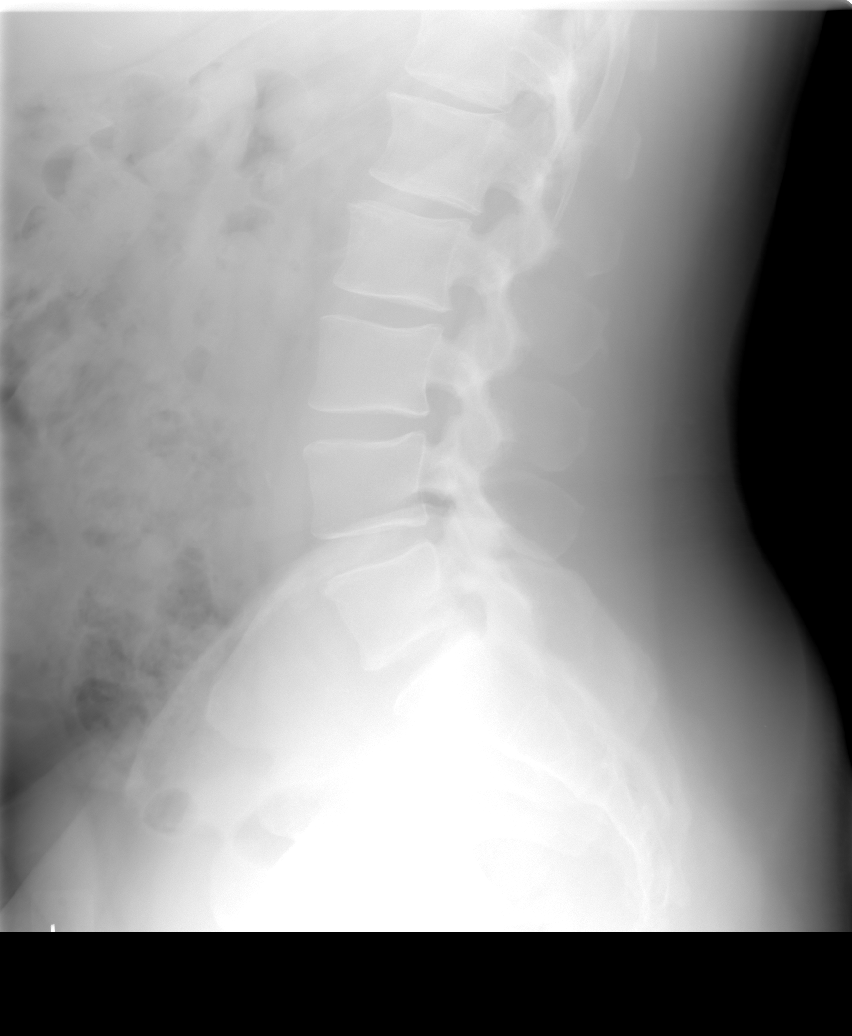

[view not recorded (5 of 5)]
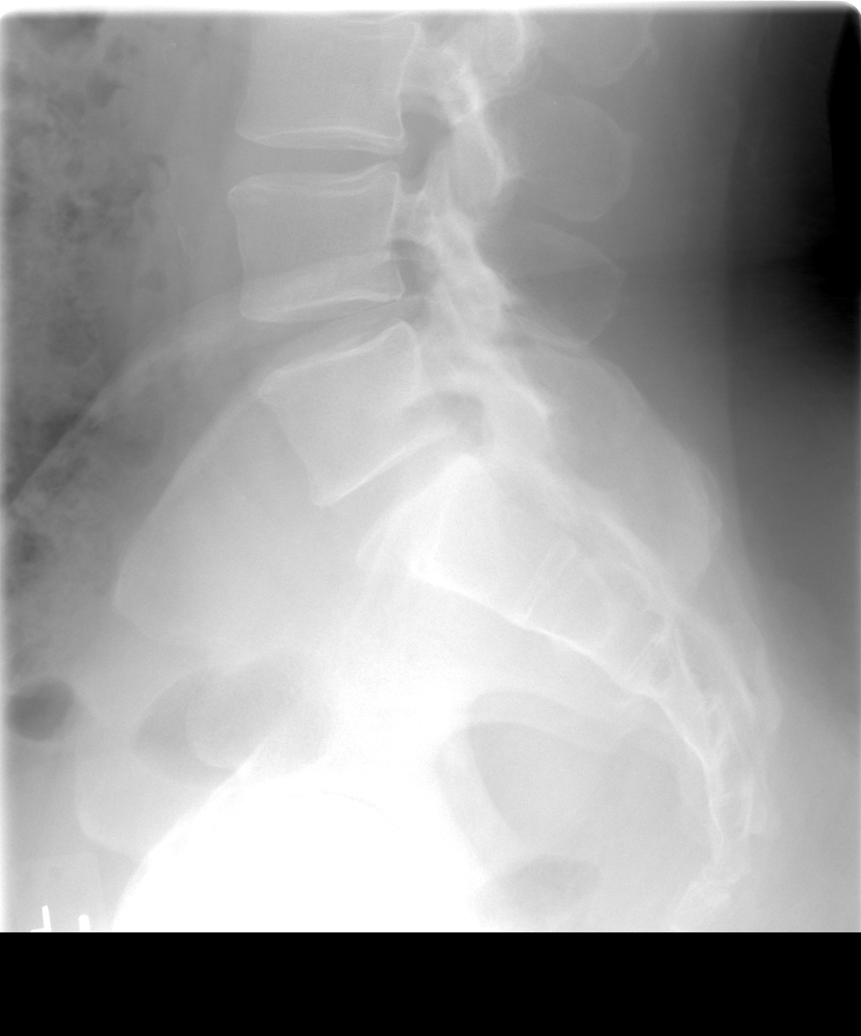

[5 of 5 positions shown; findings below may reference images not displayed]

FINDINGS: Five lumbar type vertebral bodies are well visualized. Mild
osteophytic changes are seen. Mild facet hypertrophic changes are
noted at L5-S1. No vertebral body height loss is seen. No
anterolisthesis is noted.
IMPRESSION: Mild degenerative change without acute abnormality.

## 2015-05-09 ENCOUNTER — Encounter (HOSPITAL_COMMUNITY): Payer: Self-pay | Admitting: Emergency Medicine

## 2015-05-09 ENCOUNTER — Emergency Department (HOSPITAL_COMMUNITY)
Admission: EM | Admit: 2015-05-09 | Discharge: 2015-05-09 | Disposition: A | Discharge status: Home / Self Care | Payer: BLUE CROSS/BLUE SHIELD | Attending: Emergency Medicine | Admitting: Emergency Medicine

## 2015-05-09 DIAGNOSIS — Y92410 Unspecified street and highway as the place of occurrence of the external cause: Secondary | ICD-10-CM | POA: Diagnosis not present

## 2015-05-09 DIAGNOSIS — Z9889 Other specified postprocedural states: Secondary | ICD-10-CM | POA: Diagnosis not present

## 2015-05-09 DIAGNOSIS — Z87891 Personal history of nicotine dependence: Secondary | ICD-10-CM | POA: Diagnosis not present

## 2015-05-09 DIAGNOSIS — Z7982 Long term (current) use of aspirin: Secondary | ICD-10-CM | POA: Insufficient documentation

## 2015-05-09 DIAGNOSIS — Z791 Long term (current) use of non-steroidal anti-inflammatories (NSAID): Secondary | ICD-10-CM | POA: Insufficient documentation

## 2015-05-09 DIAGNOSIS — Z79899 Other long term (current) drug therapy: Secondary | ICD-10-CM | POA: Diagnosis not present

## 2015-05-09 DIAGNOSIS — S8012XA Contusion of left lower leg, initial encounter: Secondary | ICD-10-CM | POA: Diagnosis not present

## 2015-05-09 DIAGNOSIS — S8992XA Unspecified injury of left lower leg, initial encounter: Secondary | ICD-10-CM | POA: Diagnosis present

## 2015-05-09 DIAGNOSIS — Y998 Other external cause status: Secondary | ICD-10-CM | POA: Insufficient documentation

## 2015-05-09 DIAGNOSIS — Y9384 Activity, sleeping: Secondary | ICD-10-CM | POA: Insufficient documentation

## 2015-05-09 DIAGNOSIS — I10 Essential (primary) hypertension: Secondary | ICD-10-CM | POA: Insufficient documentation

## 2015-05-09 MED ORDER — ENOXAPARIN SODIUM 120 MG/0.8ML ~~LOC~~ SOLN
1.0000 mg/kg | Freq: Once | SUBCUTANEOUS | Status: AC
  Administered 2015-05-09: 110 mg via SUBCUTANEOUS
  Filled 2015-05-09: qty 0.8

## 2015-05-09 MED ORDER — NAPROXEN 500 MG PO TABS: 500.0000 mg | ORAL_TABLET | Freq: Two times a day (BID) | ORAL | Status: DC

## 2015-05-09 MED ORDER — OXYCODONE-ACETAMINOPHEN 5-325 MG PO TABS: 1.0000 | ORAL_TABLET | Freq: Four times a day (QID) | ORAL | Status: DC | PRN
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
## 2015-05-09 NOTE — ED Notes (Signed)
PA-C at bedside 

## 2015-05-09 NOTE — ED Notes (Signed)
Patient verbalized understanding of discharge instructions and denies any further needs or questions at this time. VS stable. Patient ambulatory with steady gait. Assisted to ED entrance in wheelchair.   

## 2015-05-09 NOTE — Discharge Instructions (Signed)
It is important for you to follow up tomorrow to have an ultrasound performed of your leg to make sure that there is not a blood clot.

## 2015-05-10 ENCOUNTER — Ambulatory Visit (HOSPITAL_COMMUNITY)
Admission: RE | Admit: 2015-05-10 | Discharge: 2015-05-10 | Disposition: A | Discharge status: Home / Self Care | Payer: BLUE CROSS/BLUE SHIELD | Source: Ambulatory Visit | Attending: Emergency Medicine | Admitting: Emergency Medicine

## 2015-05-10 DIAGNOSIS — M79605 Pain in left leg: Secondary | ICD-10-CM | POA: Insufficient documentation

## 2015-05-10 DIAGNOSIS — M79609 Pain in unspecified limb: Secondary | ICD-10-CM | POA: Diagnosis not present

## 2015-05-10 DIAGNOSIS — M7989 Other specified soft tissue disorders: Secondary | ICD-10-CM | POA: Insufficient documentation

## 2015-05-10 NOTE — Progress Notes (Signed)
VASCULAR LAB PRELIMINARY  PRELIMINARY  PRELIMINARY  PRELIMINARY  Left lower extremity venous duplex completed.    Preliminary report:  There is no DVT or SVT noted in the left lower extremity.    Aradhana Gin, RVT 05/10/2015, 8:23 AM

## 2015-06-03 DIAGNOSIS — H524 Presbyopia: Secondary | ICD-10-CM | POA: Diagnosis not present

## 2015-06-10 DIAGNOSIS — Z125 Encounter for screening for malignant neoplasm of prostate: Secondary | ICD-10-CM | POA: Diagnosis not present

## 2015-06-10 DIAGNOSIS — Z0000 Encounter for general adult medical examination without abnormal findings: Secondary | ICD-10-CM | POA: Diagnosis not present

## 2015-06-10 DIAGNOSIS — R7301 Impaired fasting glucose: Secondary | ICD-10-CM | POA: Diagnosis not present

## 2015-06-10 DIAGNOSIS — E784 Other hyperlipidemia: Secondary | ICD-10-CM | POA: Diagnosis not present

## 2015-06-17 DIAGNOSIS — E784 Other hyperlipidemia: Secondary | ICD-10-CM | POA: Diagnosis not present

## 2015-06-17 DIAGNOSIS — Z125 Encounter for screening for malignant neoplasm of prostate: Secondary | ICD-10-CM | POA: Diagnosis not present

## 2015-06-17 DIAGNOSIS — R7301 Impaired fasting glucose: Secondary | ICD-10-CM | POA: Diagnosis not present

## 2015-06-17 DIAGNOSIS — E8881 Metabolic syndrome: Secondary | ICD-10-CM | POA: Diagnosis not present

## 2015-06-17 DIAGNOSIS — Z1212 Encounter for screening for malignant neoplasm of rectum: Secondary | ICD-10-CM | POA: Diagnosis not present

## 2015-06-17 DIAGNOSIS — Z0000 Encounter for general adult medical examination without abnormal findings: Secondary | ICD-10-CM | POA: Diagnosis not present

## 2015-06-17 DIAGNOSIS — I10 Essential (primary) hypertension: Secondary | ICD-10-CM | POA: Diagnosis not present

## 2015-06-17 DIAGNOSIS — Z1389 Encounter for screening for other disorder: Secondary | ICD-10-CM | POA: Diagnosis not present

## 2015-08-16 ENCOUNTER — Emergency Department (HOSPITAL_COMMUNITY): Payer: BLUE CROSS/BLUE SHIELD

## 2015-08-16 ENCOUNTER — Emergency Department (HOSPITAL_COMMUNITY)
Admission: EM | Admit: 2015-08-16 | Discharge: 2015-08-16 | Disposition: A | Discharge status: Home / Self Care | Payer: BLUE CROSS/BLUE SHIELD | Attending: Emergency Medicine | Admitting: Emergency Medicine

## 2015-08-16 ENCOUNTER — Encounter (HOSPITAL_COMMUNITY): Payer: Self-pay | Admitting: Emergency Medicine

## 2015-08-16 ENCOUNTER — Emergency Department (HOSPITAL_BASED_OUTPATIENT_CLINIC_OR_DEPARTMENT_OTHER)
Admit: 2015-08-16 | Discharge: 2015-08-16 | Disposition: A | Discharge status: Home / Self Care | Payer: BLUE CROSS/BLUE SHIELD | Attending: Emergency Medicine | Admitting: Emergency Medicine

## 2015-08-16 DIAGNOSIS — Z7982 Long term (current) use of aspirin: Secondary | ICD-10-CM | POA: Diagnosis not present

## 2015-08-16 DIAGNOSIS — M7989 Other specified soft tissue disorders: Secondary | ICD-10-CM

## 2015-08-16 DIAGNOSIS — I10 Essential (primary) hypertension: Secondary | ICD-10-CM | POA: Insufficient documentation

## 2015-08-16 DIAGNOSIS — Z87891 Personal history of nicotine dependence: Secondary | ICD-10-CM | POA: Diagnosis not present

## 2015-08-16 DIAGNOSIS — M25471 Effusion, right ankle: Secondary | ICD-10-CM | POA: Diagnosis not present

## 2015-08-16 LAB — BASIC METABOLIC PANEL
Anion gap: 8 (ref 5–15)
BUN: 9 mg/dL (ref 6–20)
CHLORIDE: 107 mmol/L (ref 101–111)
CO2: 23 mmol/L (ref 22–32)
CREATININE: 1.07 mg/dL (ref 0.61–1.24)
Calcium: 9.4 mg/dL (ref 8.9–10.3)
Glucose, Bld: 111 mg/dL — ABNORMAL HIGH (ref 65–99)
POTASSIUM: 3.8 mmol/L (ref 3.5–5.1)
SODIUM: 138 mmol/L (ref 135–145)

## 2015-08-16 LAB — CBC WITH DIFFERENTIAL/PLATELET
BASOS PCT: 0 %
Basophils Absolute: 0 10*3/uL (ref 0.0–0.1)
EOS ABS: 0.2 10*3/uL (ref 0.0–0.7)
Eosinophils Relative: 2 %
HCT: 41.5 % (ref 39.0–52.0)
Hemoglobin: 14.1 g/dL (ref 13.0–17.0)
Lymphocytes Relative: 27 %
Lymphs Abs: 2.6 10*3/uL (ref 0.7–4.0)
MCH: 31.1 pg (ref 26.0–34.0)
MCHC: 34 g/dL (ref 30.0–36.0)
MCV: 91.4 fL (ref 78.0–100.0)
Monocytes Absolute: 0.6 10*3/uL (ref 0.1–1.0)
Monocytes Relative: 6 %
Neutro Abs: 6.3 10*3/uL (ref 1.7–7.7)
Neutrophils Relative %: 65 %
PLATELETS: 246 10*3/uL (ref 150–400)
RBC: 4.54 MIL/uL (ref 4.22–5.81)
RDW: 13.3 % (ref 11.5–15.5)
WBC: 9.7 10*3/uL (ref 4.0–10.5)

## 2015-08-16 LAB — D-DIMER, QUANTITATIVE (NOT AT ARMC): D DIMER QUANT: 0.34 ug{FEU}/mL (ref 0.00–0.50)

## 2015-08-16 IMAGING — DX DG ANKLE COMPLETE 3+V*R*
3 series · 3 of 3 positions shown · non-contrast
Comparison: None.

CLINICAL DATA: Soft tissue swelling for 1 day.

EXAM:
RIGHT ANKLE - COMPLETE 3+ VIEW

[ankle ap]
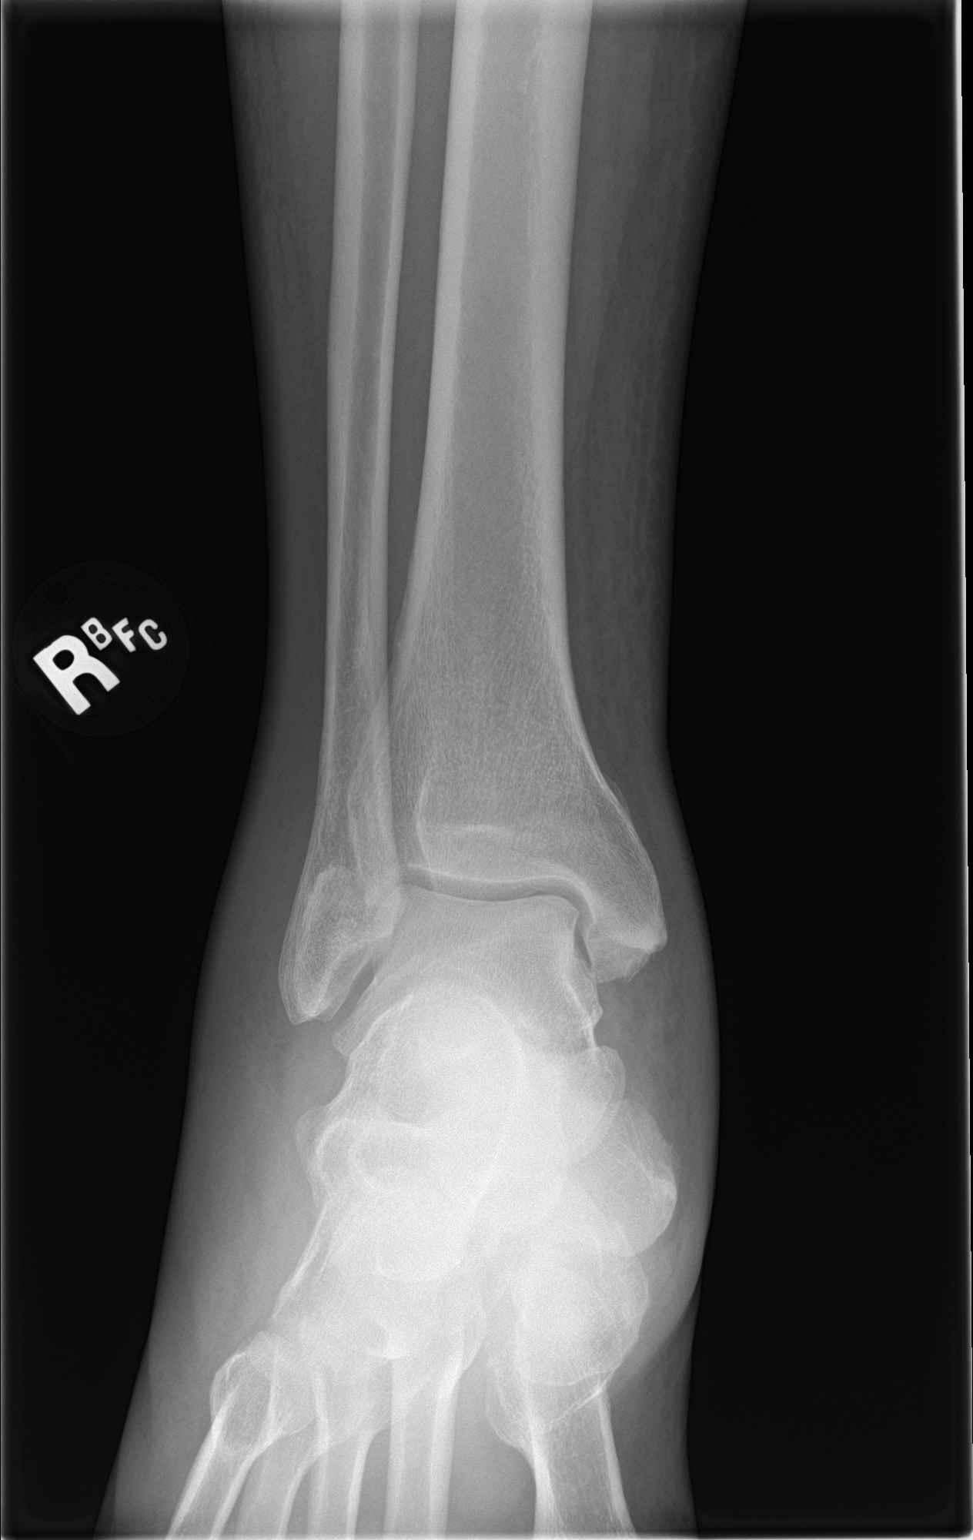

[ankle obl]
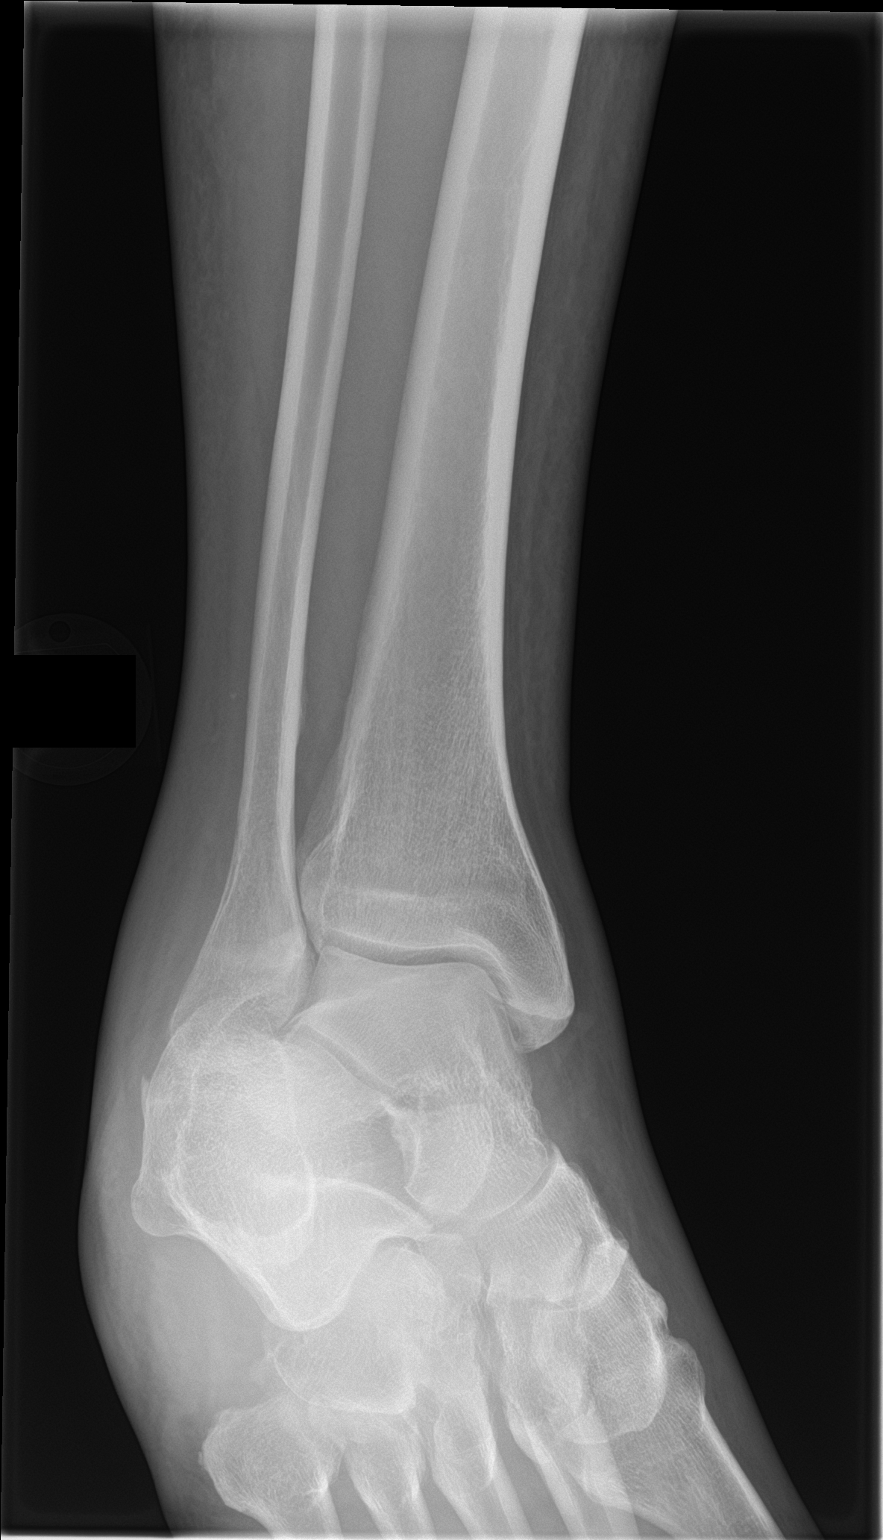

[ankle lat]
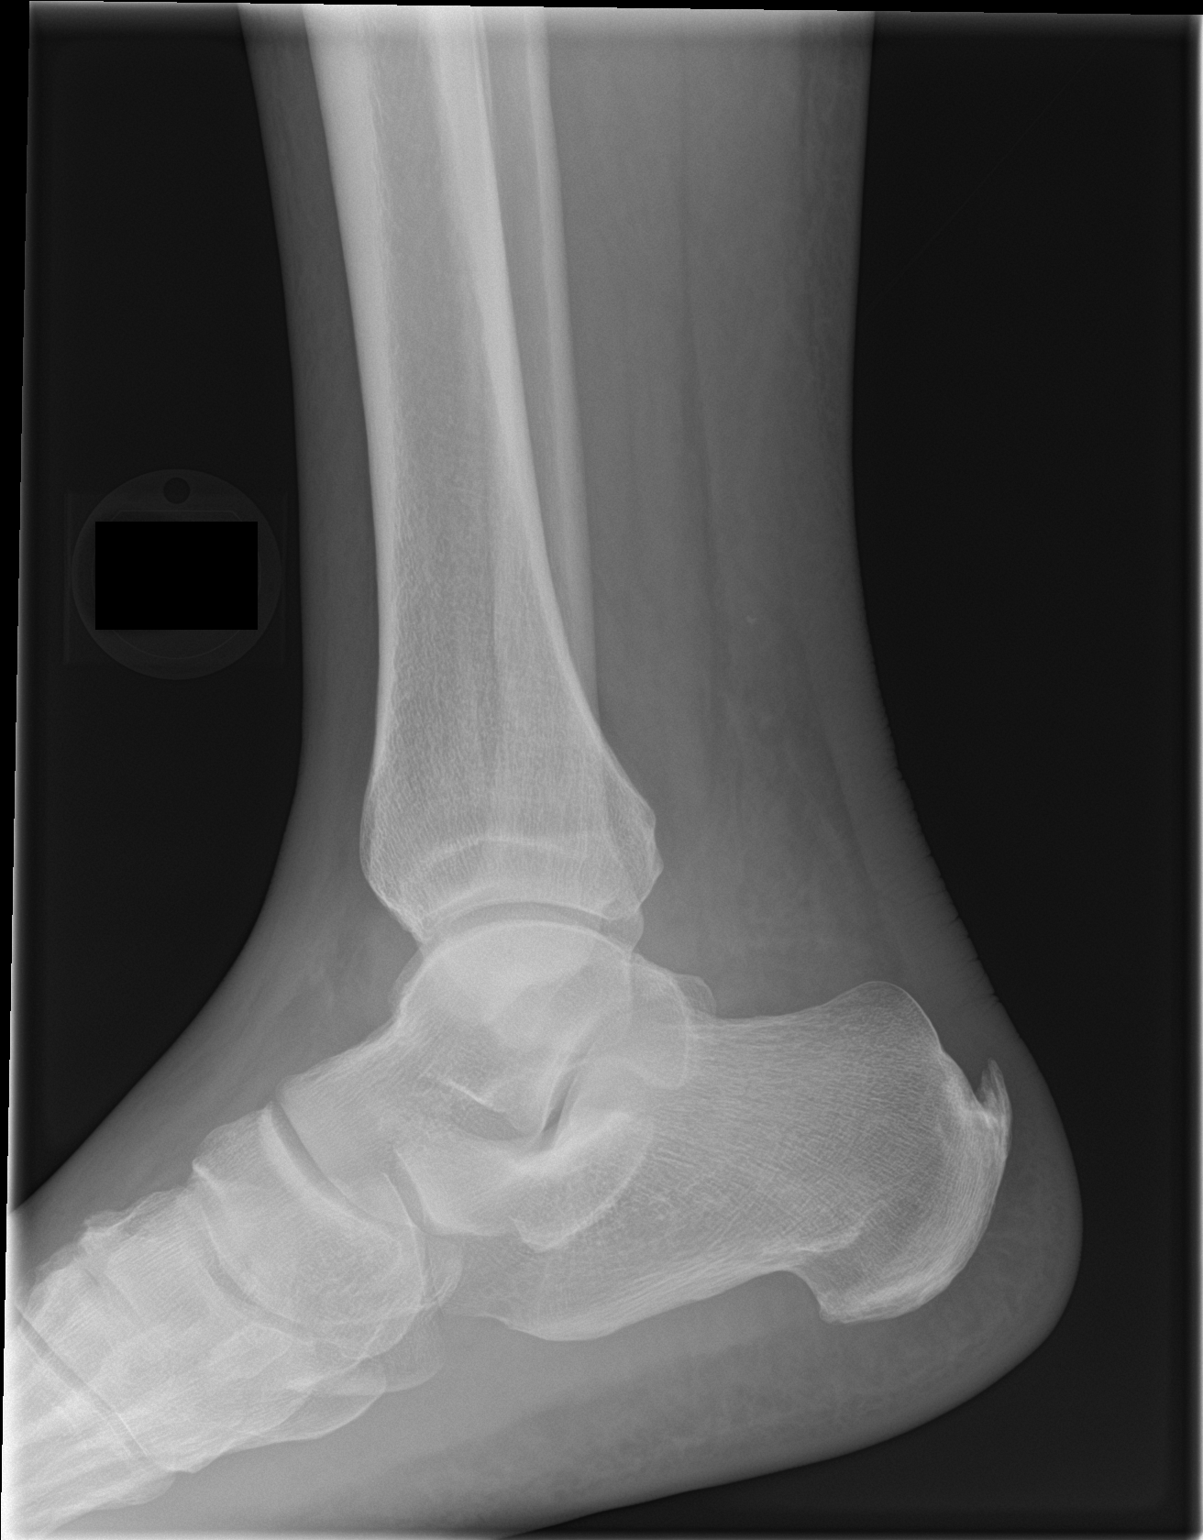

[3 of 3 positions shown; findings below may reference images not displayed]

FINDINGS: Diffuse soft tissue swelling about the ankle without fracture,
subluxation, or arthritic changes. No soft tissue calcification or
erosion. Probable ankle joint effusion.
IMPRESSION: Soft tissue swelling and probable ankle joint effusion. No osseous
abnormality.

## 2015-08-16 IMAGING — DX DG FOOT COMPLETE 3+V*R*
3 series · 3 of 3 positions shown · non-contrast
Comparison: None.

CLINICAL DATA: Foot and ankle swelling.  Symptoms for 1 day.

EXAM:
RIGHT FOOT COMPLETE - 3+ VIEW

[foot ap]
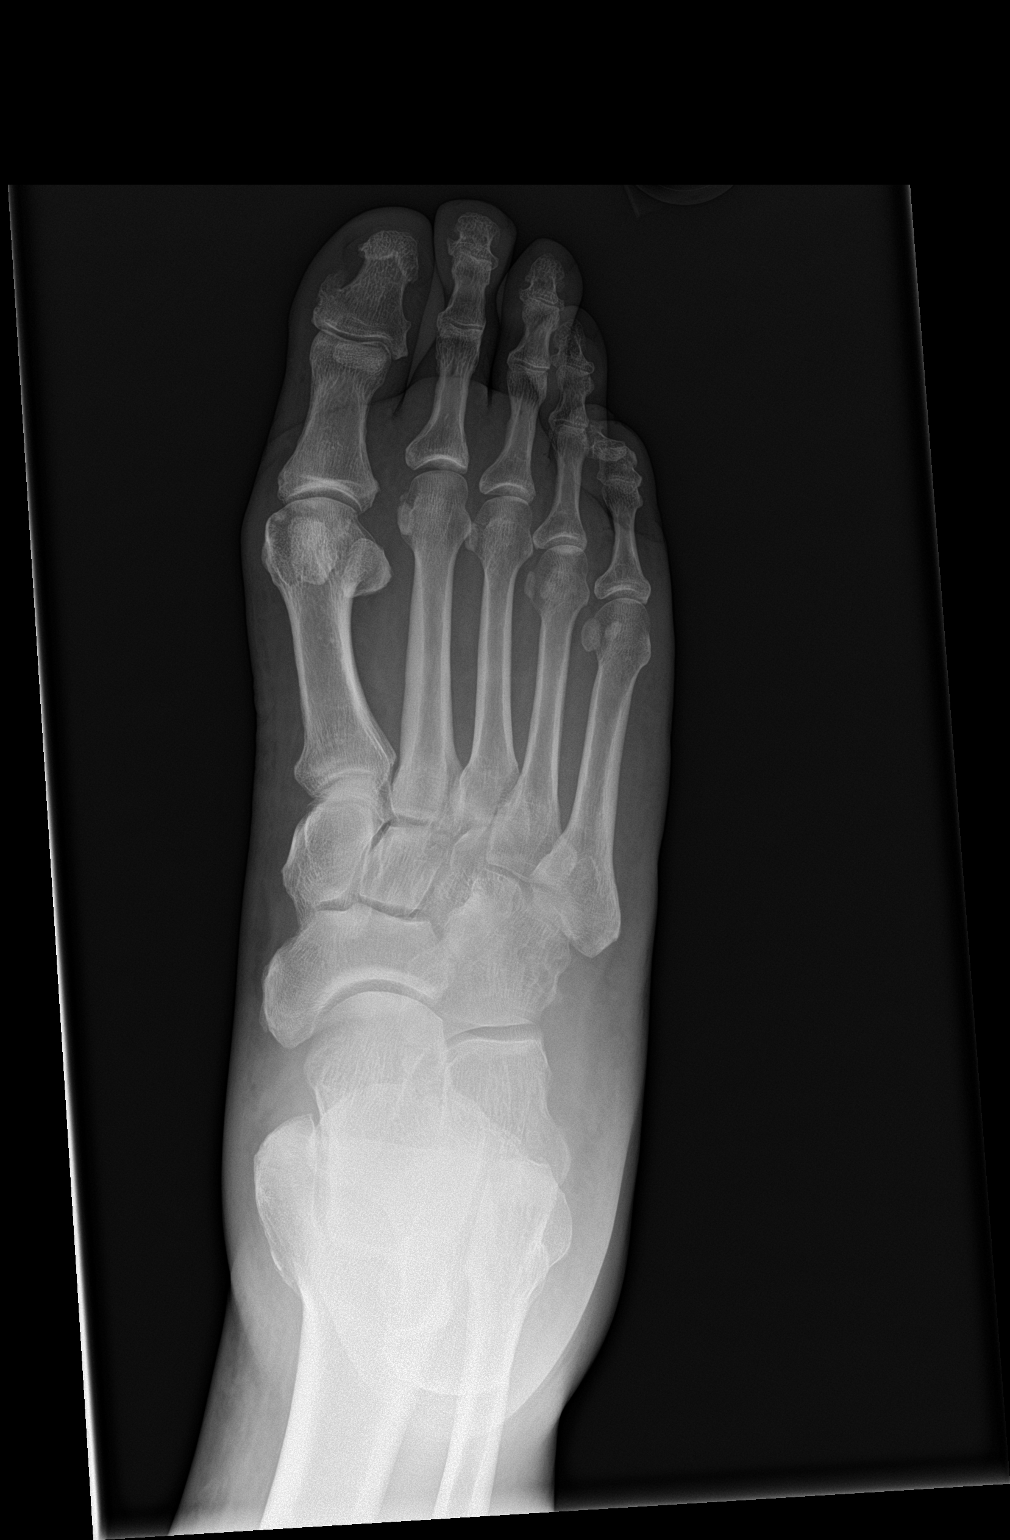

[foot obl]
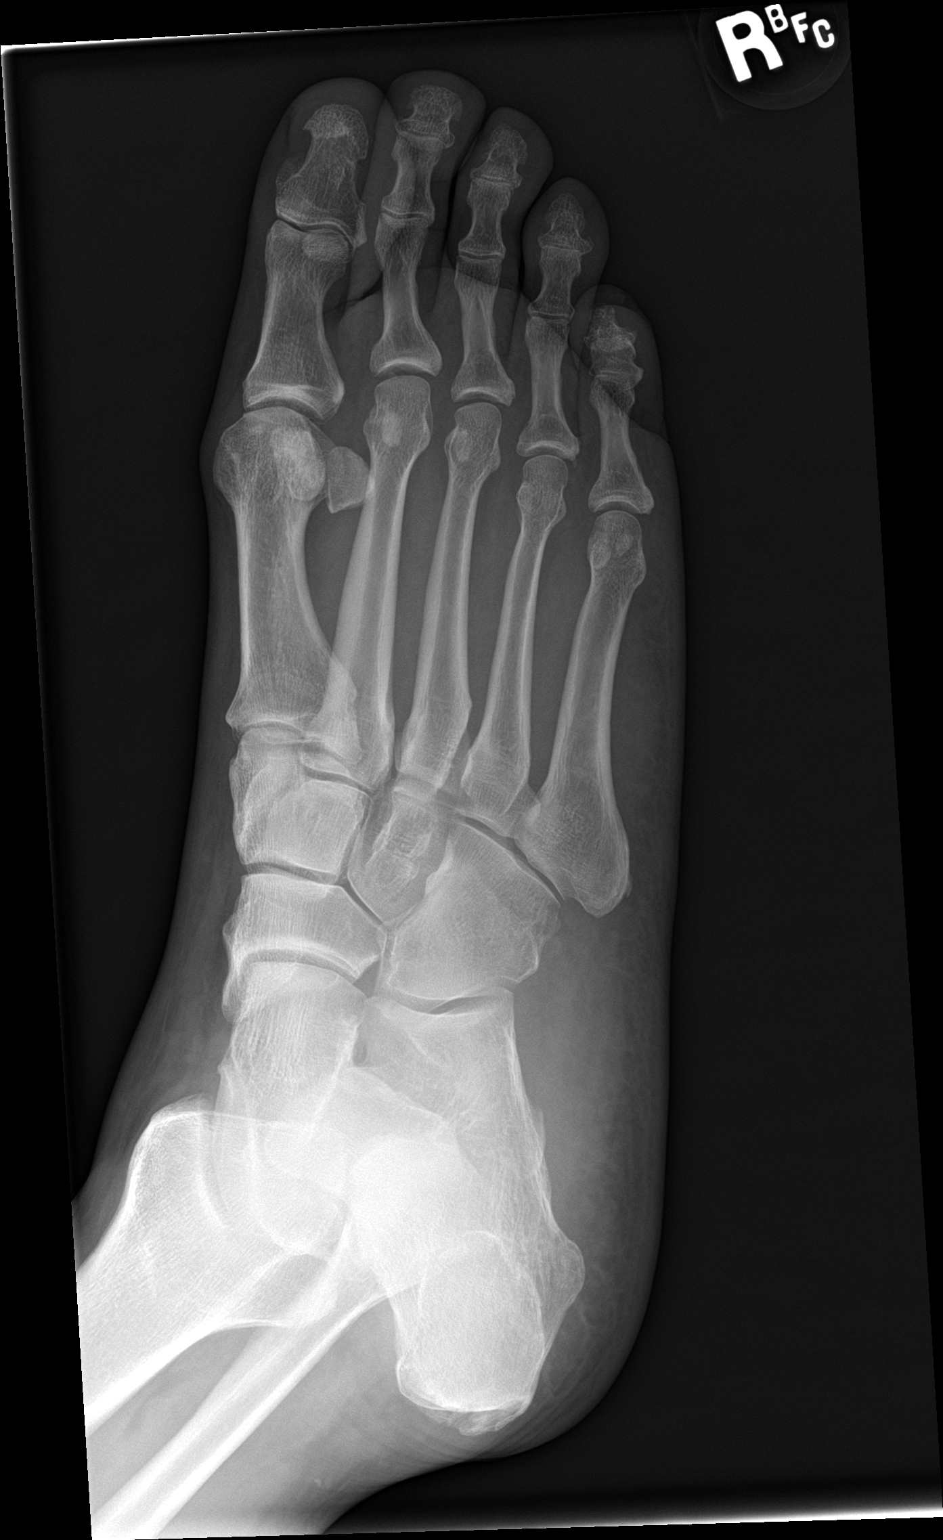

[foot lat]
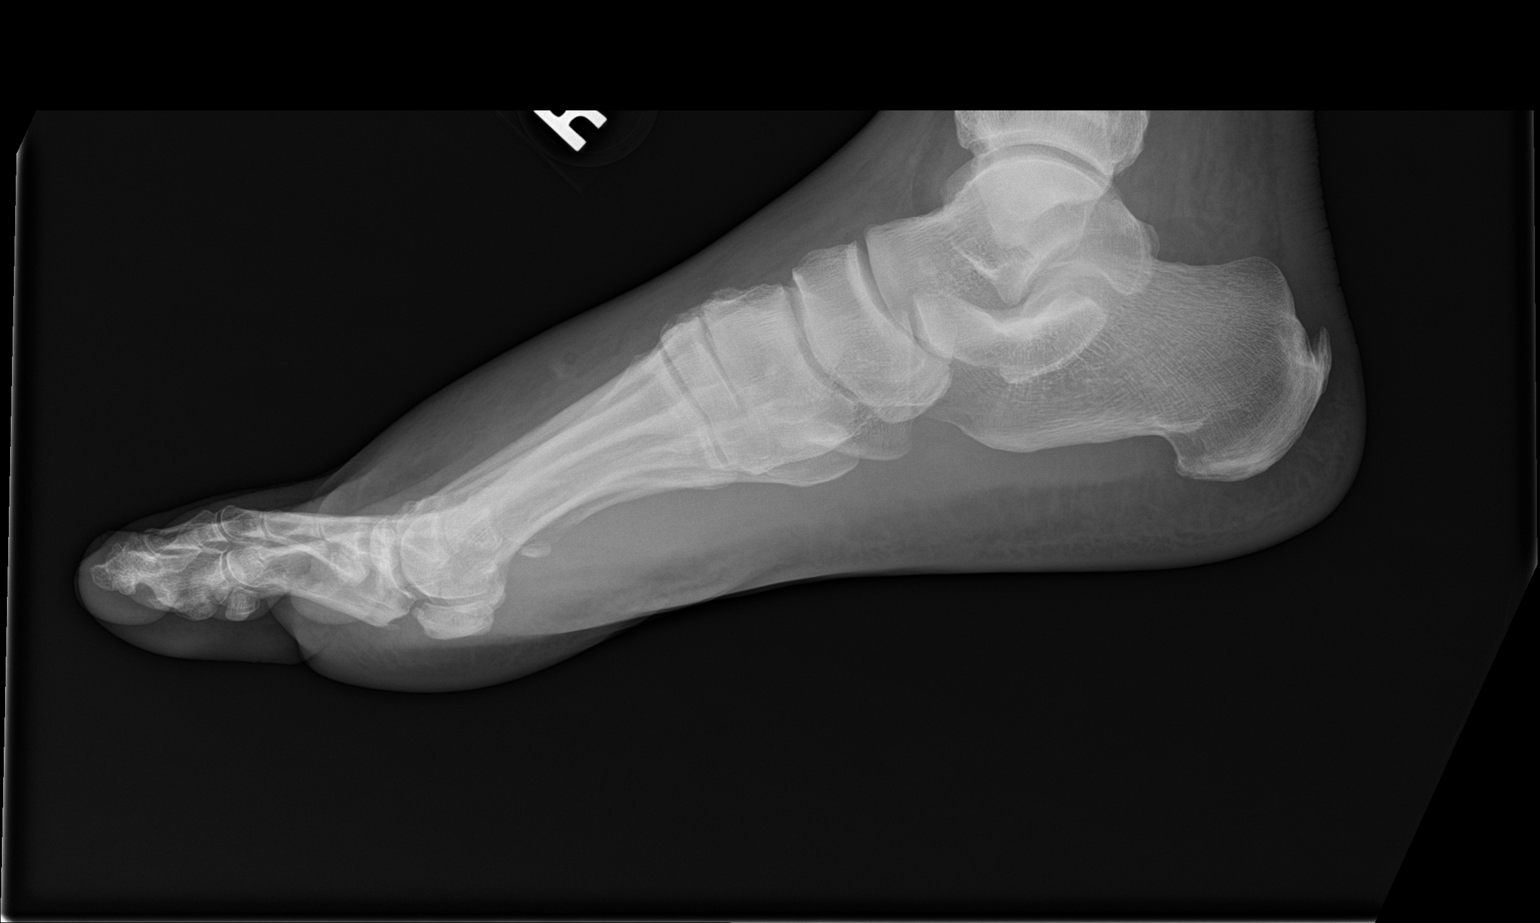

[3 of 3 positions shown; findings below may reference images not displayed]

FINDINGS: Nonspecific diffuse soft tissue swelling. Possible small ankle joint
effusion. No acute fracture, subluxation, or erosion. No notable
degenerative changes. Achilles enthesophyte.
IMPRESSION: Soft tissue swelling without opaque foreign body or acute osseous
finding.

## 2015-08-16 MED ORDER — DEXAMETHASONE SODIUM PHOSPHATE 10 MG/ML IJ SOLN
10.0000 mg | Freq: Once | INTRAMUSCULAR | Status: AC
  Administered 2015-08-16: 10 mg via INTRAMUSCULAR
  Filled 2015-08-16: qty 1

## 2015-08-16 MED ORDER — NAPROXEN 500 MG PO TABS: 500.0000 mg | ORAL_TABLET | Freq: Two times a day (BID) | ORAL | Status: DC

## 2015-08-16 NOTE — ED Notes (Signed)
Pt has returned from [place] without incident. Pt hooked back up to BP cuff and pulse ox.

## 2015-08-16 NOTE — ED Provider Notes (Signed)
7 AM: Care assumed from [facility], PA-C at shift change.  Briefly, patient presents with right foot, ankle, and calf pain x 9 days.  Long haul truck driver.  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  Plain films show soft tissue swelling and probable ankle joint effusion.  Labs without acute abnormalities.  D-dimer negative; however, patient is high risk for DVT and U/S is pending.  Plan to d/c home with RICE, anti-inflammatories, and pain medication if negative and follow up orthopedics.   Ultrasound negative for DVT.  Decadron in ED.  Home with naproxen.  Discussed RICE therapy.  Patient declined narcotics.  Follow up orthopedics.  Return precautions discussed.  Patient agrees and acknowledges the above plan for discharge.  Gloriann Loan, PA-C 08/16/15 [place], DO 08/17/15 [phone]

## 2015-08-16 NOTE — Progress Notes (Signed)
VASCULAR LAB PRELIMINARY  PRELIMINARY  PRELIMINARY  PRELIMINARY  Right lower extremity venous duplex completed.    Preliminary report:  There is no DVT or SVT noted in the right lower extremity.  There are enlarged inguinal lymph nodes noted.  Diogenes Whirley, RVT 08/16/2015, 8:23 AM

## 2015-08-16 NOTE — Discharge Instructions (Signed)
Edema °Edema is an abnormal buildup of fluids in your body tissues. Edema is somewhat dependent on gravity to pull the fluid to the lowest place in your body. That makes the condition more common in the legs and thighs (lower extremities). Painless swelling of the feet and ankles is common and becomes more likely as you get older. It is also common in looser tissues, like around your eyes.  °When the affected area is squeezed, the fluid may move out of that spot and leave a dent for a few moments. This dent is called pitting.  °CAUSES  °There are many possible causes of edema. Eating too much salt and being on your feet or sitting for a long time can cause edema in your legs and ankles. Hot weather may make edema worse. Common medical causes of edema include: °· Heart failure. °· Liver disease. °· Kidney disease. °· Weak blood vessels in your legs. °· Cancer. °· An injury. °· Pregnancy. °· Some medications. °· Obesity.  °SYMPTOMS  °Edema is usually painless. Your skin may look swollen or shiny.  °DIAGNOSIS  °Your health care provider may be able to diagnose edema by asking about your medical history and doing a physical exam. You may need to have tests such as X-rays, an electrocardiogram, or blood tests to check for medical conditions that may cause edema.  °TREATMENT  °Edema treatment depends on the cause. If you have heart, liver, or kidney disease, you need the treatment appropriate for these conditions. General treatment may include: °· Elevation of the affected body part above the level of your heart. °· Compression of the affected body part. Pressure from elastic bandages or support stockings squeezes the tissues and forces fluid back into the blood vessels. This keeps fluid from entering the tissues. °· Restriction of fluid and salt intake. °· Use of a water pill (diuretic). These medications are appropriate only for some types of edema. They pull fluid out of your body and make you urinate more often. This  gets rid of fluid and reduces swelling, but diuretics can have side effects. Only use diuretics as directed by your health care provider. °HOME CARE INSTRUCTIONS  °· Keep the affected body part above the level of your heart when you are lying down.   °· Do not sit still or stand for prolonged periods.   °· Do not put anything directly under your knees when lying down. °· Do not wear constricting clothing or garters on your upper legs.   °· Exercise your legs to work the fluid back into your blood vessels. This may help the swelling go down.   °· Wear elastic bandages or support stockings to reduce ankle swelling as directed by your health care provider.   °· Eat a low-salt diet to reduce fluid if your health care provider recommends it.   °· Only take medicines as directed by your health care provider.  °SEEK MEDICAL CARE IF:  °· Your edema is not responding to treatment. °· You have heart, liver, or kidney disease and notice symptoms of edema. °· You have edema in your legs that does not improve after elevating them.   °· You have sudden and unexplained weight gain. °SEEK IMMEDIATE MEDICAL CARE IF:  °· You develop shortness of breath or chest pain.   °· You cannot breathe when you lie down. °· You develop pain, redness, or warmth in the swollen areas.   °· You have heart, liver, or kidney disease and suddenly get edema. °· You have a fever and your symptoms suddenly get worse. °MAKE SURE YOU:  °·   Understand these instructions. °· Will watch your condition. °· Will get help right away if you are not doing well or get worse. °  °This information is not intended to replace advice given to you by your health care provider. Make sure you discuss any questions you have with your health care provider. °  °Document Released: 01/11/2005 Document Revised: 02/01/2014 Document Reviewed: 11/03/2012 °Elsevier Interactive Patient Education ©2016 [employer] ° °

## 2015-08-16 NOTE — ED Notes (Signed)
Pt. reports swelling/edema at right lower leg extending to ankle and foot onset last week , denies injury , ambulatory , no fever or chills.

## 2015-08-16 NOTE — ED Provider Notes (Signed)
CSN: [id number]     Arrival date & time 08/16/15  0203 History   First MD Initiated Contact with Patient 08/16/15 [phone]     Chief Complaint  Patient presents with  . Leg Swelling     (Consider location/radiation/quality/duration/timing/severity/associated sxs/prior Treatment) HPI Comments: Patient's presents with complaint of right foot, ankle, calf pain and swelling starting 9 days ago. 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 No history of gout or other arthritis. No injuries to the ankle. Onset of swelling was gradual. Patient is ambulatory but with pain. No fevers, nausea, vomiting, or diarrhea. No chest pain or shortness of breath. No treatments prior to arrival. Onset of symptoms acute. Course is gradual. Nothing makes symptoms better.  The history is provided by the patient.    Past Medical History  Diagnosis Date  . Hypertension    Past Surgical History  Procedure Laterality Date  . Cardiac catheterization  2008   Family History  Problem Relation Age of Onset  . Colon cancer Maternal Uncle   . Colon cancer Cousin    Social History  Substance Use Topics  . Smoking status: Former Smoker    Types: Cigarettes  . Smokeless tobacco: Never Used  . Alcohol Use: No    Review of Systems  Constitutional: Negative for fever.  HENT: Negative for rhinorrhea and sore throat.   Eyes: Negative for redness.  Respiratory: Negative for cough.   Cardiovascular: Positive for leg swelling. Negative for chest pain.  Gastrointestinal: Negative for nausea, vomiting, abdominal pain and diarrhea.  Genitourinary: Negative for dysuria.  Musculoskeletal: Positive for myalgias.  Skin: Negative for rash.  Neurological: Negative for headaches.    Allergies  Review of patient's allergies indicates no known allergies.  Home Medications   Prior to Admission medications   Medication Sig Start Date End Date Taking? Authorizing Provider  aspirin 81 MG  tablet Take 81 mg by mouth daily.    Historical Provider, MD  lisinopril-hydrochlorothiazide (PRINZIDE,ZESTORETIC) 20-12.5 MG per tablet Take 1 tablet by mouth Daily. 09/30/11   Historical Provider, MD  meloxicam (MOBIC) 7.5 MG tablet Take 7.5 mg by mouth daily.    Historical Provider, MD  Multiple Vitamins-Minerals (MULTIVITAMIN WITH MINERALS) tablet Take 1 tablet by mouth daily.    Historical Provider, MD  naproxen (NAPROSYN) 500 MG tablet Take 1 tablet (500 mg total) by mouth 2 (two) times daily. 05/09/15   Hyman Bible, PA-C  oxyCODONE-acetaminophen (PERCOCET/ROXICET) 5-325 MG tablet Take 1-2 tablets by mouth every 6 (six) hours as needed for severe pain. 05/09/15   Heather Laisure, PA-C   BP 130/81 mmHg  Pulse 76  Temp(Src) 99.2 F (37.3 C) (Oral)  Resp 18  SpO2 100%   Physical Exam  Constitutional: He appears well-developed and well-nourished.  HENT:  Head: Normocephalic and atraumatic.  Eyes: Conjunctivae are normal. Right eye exhibits no discharge. Left eye exhibits no discharge.  Neck: Normal range of motion. Neck supple.  Cardiovascular: Normal rate, regular rhythm and normal heart sounds.   Pulses:      Radial pulses are 2+ on the right side, and 2+ on the left side.  Pulmonary/Chest: Effort normal and breath sounds normal.  Abdominal: Soft. There is no tenderness.  Musculoskeletal: He exhibits edema and tenderness.       Right hip: Normal.       Left hip: Normal.       Right knee: Normal.       Left knee: Normal.  Right ankle: He exhibits swelling. He exhibits normal range of motion. Tenderness.       Left ankle: Normal.       Right lower leg: He exhibits swelling. He exhibits no tenderness.       Left lower leg: Normal.       Right foot: There is decreased range of motion, tenderness and swelling.       Left foot: Normal.  Neurological: He is alert.  Skin: Skin is warm and dry.  Psychiatric: He has a normal mood and affect.  Nursing note and vitals  reviewed.   ED Course  Procedures (including critical care time) Labs Review Labs Reviewed  BASIC METABOLIC PANEL - Abnormal; Notable for the following:    Glucose, Bld 111 (*)    All other components within normal limits  CBC WITH DIFFERENTIAL/PLATELET  D-DIMER, QUANTITATIVE (NOT AT [facility])    Imaging Review Dg Ankle Complete Right  08/16/2015  CLINICAL DATA:  Soft tissue swelling for 1 day. EXAM: RIGHT ANKLE - COMPLETE 3+ VIEW COMPARISON:  None. FINDINGS: Diffuse soft tissue swelling about the ankle without fracture, subluxation, or arthritic changes. No soft tissue calcification or erosion. Probable ankle joint effusion. IMPRESSION: Soft tissue swelling and probable ankle joint effusion. No osseous abnormality. Electronically Signed   By: Monte Fantasia M.D.   On: 08/16/2015 05:23   Dg Foot Complete Right  08/16/2015  CLINICAL DATA:  Foot and ankle swelling.  Symptoms for 1 day. EXAM: RIGHT FOOT COMPLETE - 3+ VIEW COMPARISON:  None. FINDINGS: Nonspecific diffuse soft tissue swelling. Possible small ankle joint effusion. No acute fracture, subluxation, or erosion. No notable degenerative changes. Achilles enthesophyte. IMPRESSION: Soft tissue swelling without opaque foreign body or acute osseous finding. Electronically Signed   By: Monte Fantasia M.D.   On: 08/16/2015 05:18   I have personally reviewed and evaluated these images and lab results as part of my medical decision-making.  4:29 AM Patient seen and examined. Work-up initiated. Medications ordered. X-rays ordered. Will screen for DVT with d-dimer.   Vital signs reviewed and are as follows: BP 130/81 mmHg  Pulse 76  Temp(Src) 99.2 F (37.3 C) (Oral)  Resp 18  SpO2 100%  5:45 AM Patient discussed with and seen by Dr. Tyrone Nine. Patient is high enough risk for DVT, feel that obtaining ultrasound to rule out DVT would be prudent.  Handoff to [facility] at shift change. If neg, d/c to home with anti-inflammatories, pain control,  RICE protocol, ortho follow-up.   MDM   Final diagnoses:  Effusion of right ankle  Calf swelling   Pending [place].    Carlisle Cater, PA-C 08/16/15 [place], DO 08/16/15 0630

## 2015-08-16 NOTE — ED Notes (Signed)
Pt A&OX4,ambulatory but wheeled out of ED via wheelchair,NAD and states he has all of his belongings with him at d/c

## 2015-09-26 DIAGNOSIS — Z23 Encounter for immunization: Secondary | ICD-10-CM | POA: Diagnosis not present

## 2016-02-18 DIAGNOSIS — F4321 Adjustment disorder with depressed mood: Secondary | ICD-10-CM | POA: Diagnosis not present

## 2016-07-20 ENCOUNTER — Emergency Department (HOSPITAL_COMMUNITY): Payer: BLUE CROSS/BLUE SHIELD

## 2016-07-20 ENCOUNTER — Emergency Department (HOSPITAL_COMMUNITY)
Admission: EM | Admit: 2016-07-20 | Discharge: 2016-07-21 | Disposition: A | Discharge status: Home / Self Care | Payer: BLUE CROSS/BLUE SHIELD | Attending: Emergency Medicine | Admitting: Emergency Medicine

## 2016-07-20 ENCOUNTER — Encounter (HOSPITAL_COMMUNITY): Payer: Self-pay | Admitting: Emergency Medicine

## 2016-07-20 DIAGNOSIS — Z79899 Other long term (current) drug therapy: Secondary | ICD-10-CM | POA: Diagnosis not present

## 2016-07-20 DIAGNOSIS — I10 Essential (primary) hypertension: Secondary | ICD-10-CM | POA: Insufficient documentation

## 2016-07-20 DIAGNOSIS — R197 Diarrhea, unspecified: Secondary | ICD-10-CM

## 2016-07-20 DIAGNOSIS — R0602 Shortness of breath: Secondary | ICD-10-CM

## 2016-07-20 DIAGNOSIS — Z87891 Personal history of nicotine dependence: Secondary | ICD-10-CM | POA: Diagnosis not present

## 2016-07-20 LAB — CBC WITH DIFFERENTIAL/PLATELET
Basophils Absolute: 0 10*3/uL (ref 0.0–0.1)
Basophils Relative: 0 %
EOS ABS: 0.6 10*3/uL (ref 0.0–0.7)
EOS PCT: 6 %
HCT: 46.1 % (ref 39.0–52.0)
Hemoglobin: 15.6 g/dL (ref 13.0–17.0)
LYMPHS ABS: 1 10*3/uL (ref 0.7–4.0)
Lymphocytes Relative: 11 %
MCH: 31.6 pg (ref 26.0–34.0)
MCHC: 33.8 g/dL (ref 30.0–36.0)
MCV: 93.3 fL (ref 78.0–100.0)
MONO ABS: 0.3 10*3/uL (ref 0.1–1.0)
MONOS PCT: 3 %
Neutro Abs: 7.3 10*3/uL (ref 1.7–7.7)
Neutrophils Relative %: 80 %
PLATELETS: 228 10*3/uL (ref 150–400)
RBC: 4.94 MIL/uL (ref 4.22–5.81)
RDW: 13.7 % (ref 11.5–15.5)
WBC: 9.2 10*3/uL (ref 4.0–10.5)

## 2016-07-20 LAB — COMPREHENSIVE METABOLIC PANEL
ALT: 61 U/L (ref 17–63)
ANION GAP: 9 (ref 5–15)
AST: 47 U/L — ABNORMAL HIGH (ref 15–41)
Albumin: 3.8 g/dL (ref 3.5–5.0)
Alkaline Phosphatase: 74 U/L (ref 38–126)
BUN: 13 mg/dL (ref 6–20)
CALCIUM: 8.9 mg/dL (ref 8.9–10.3)
CHLORIDE: 104 mmol/L (ref 101–111)
CO2: 21 mmol/L — ABNORMAL LOW (ref 22–32)
Creatinine, Ser: 1.26 mg/dL — ABNORMAL HIGH (ref 0.61–1.24)
GFR calc non Af Amer: 60 mL/min — ABNORMAL LOW (ref >60)
Glucose, Bld: 126 mg/dL — ABNORMAL HIGH (ref 65–99)
Potassium: 4 mmol/L (ref 3.5–5.1)
SODIUM: 134 mmol/L — AB (ref 135–145)
Total Bilirubin: 0.8 mg/dL (ref 0.3–1.2)
Total Protein: 7.8 g/dL (ref 6.5–8.1)

## 2016-07-20 IMAGING — CR DG CHEST 2V
2 series · 2 of 2 positions shown · non-contrast
Comparison: Chest radiograph [DATE]

CLINICAL DATA: Patient with shortness of breath, weakness and
diarrhea.

EXAM:
CHEST  2 VIEW

[chest pa]
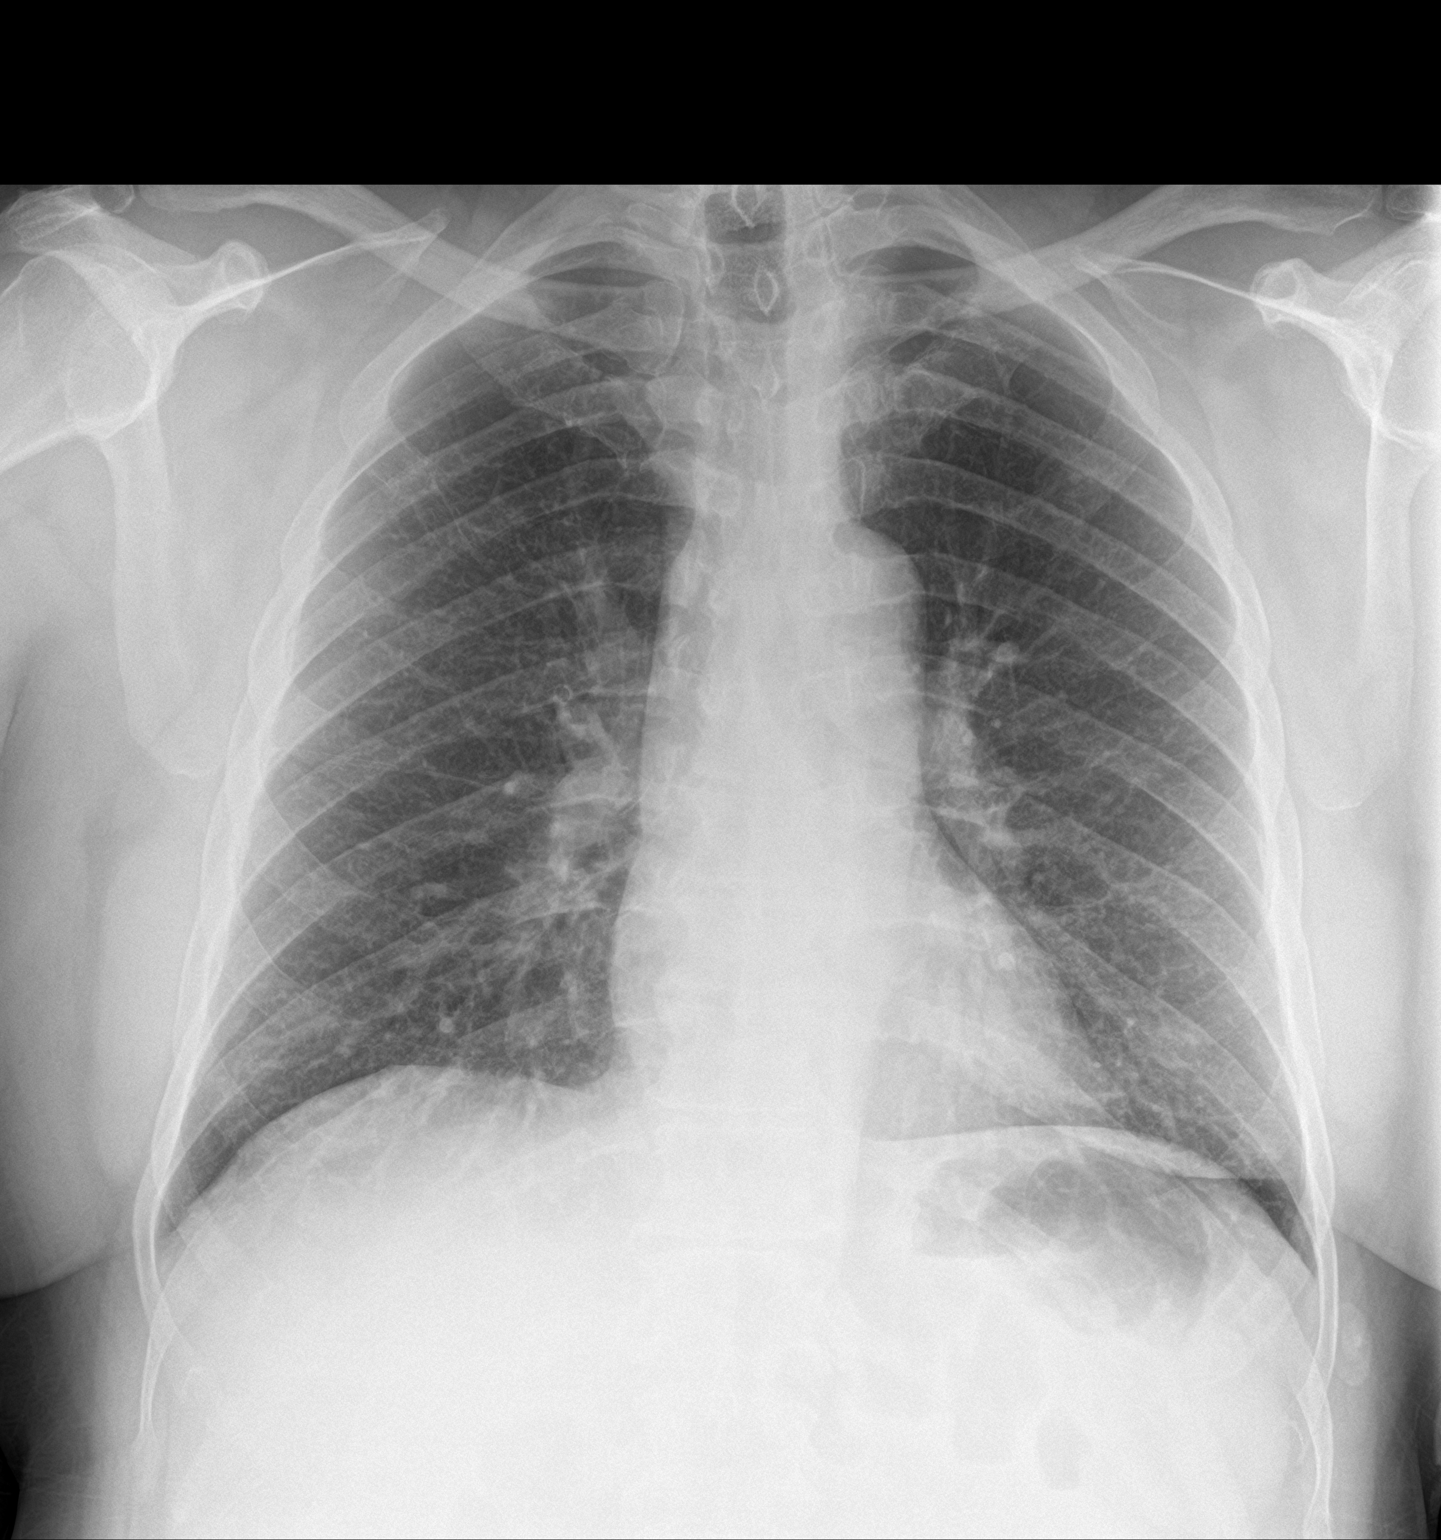

[chest lat]
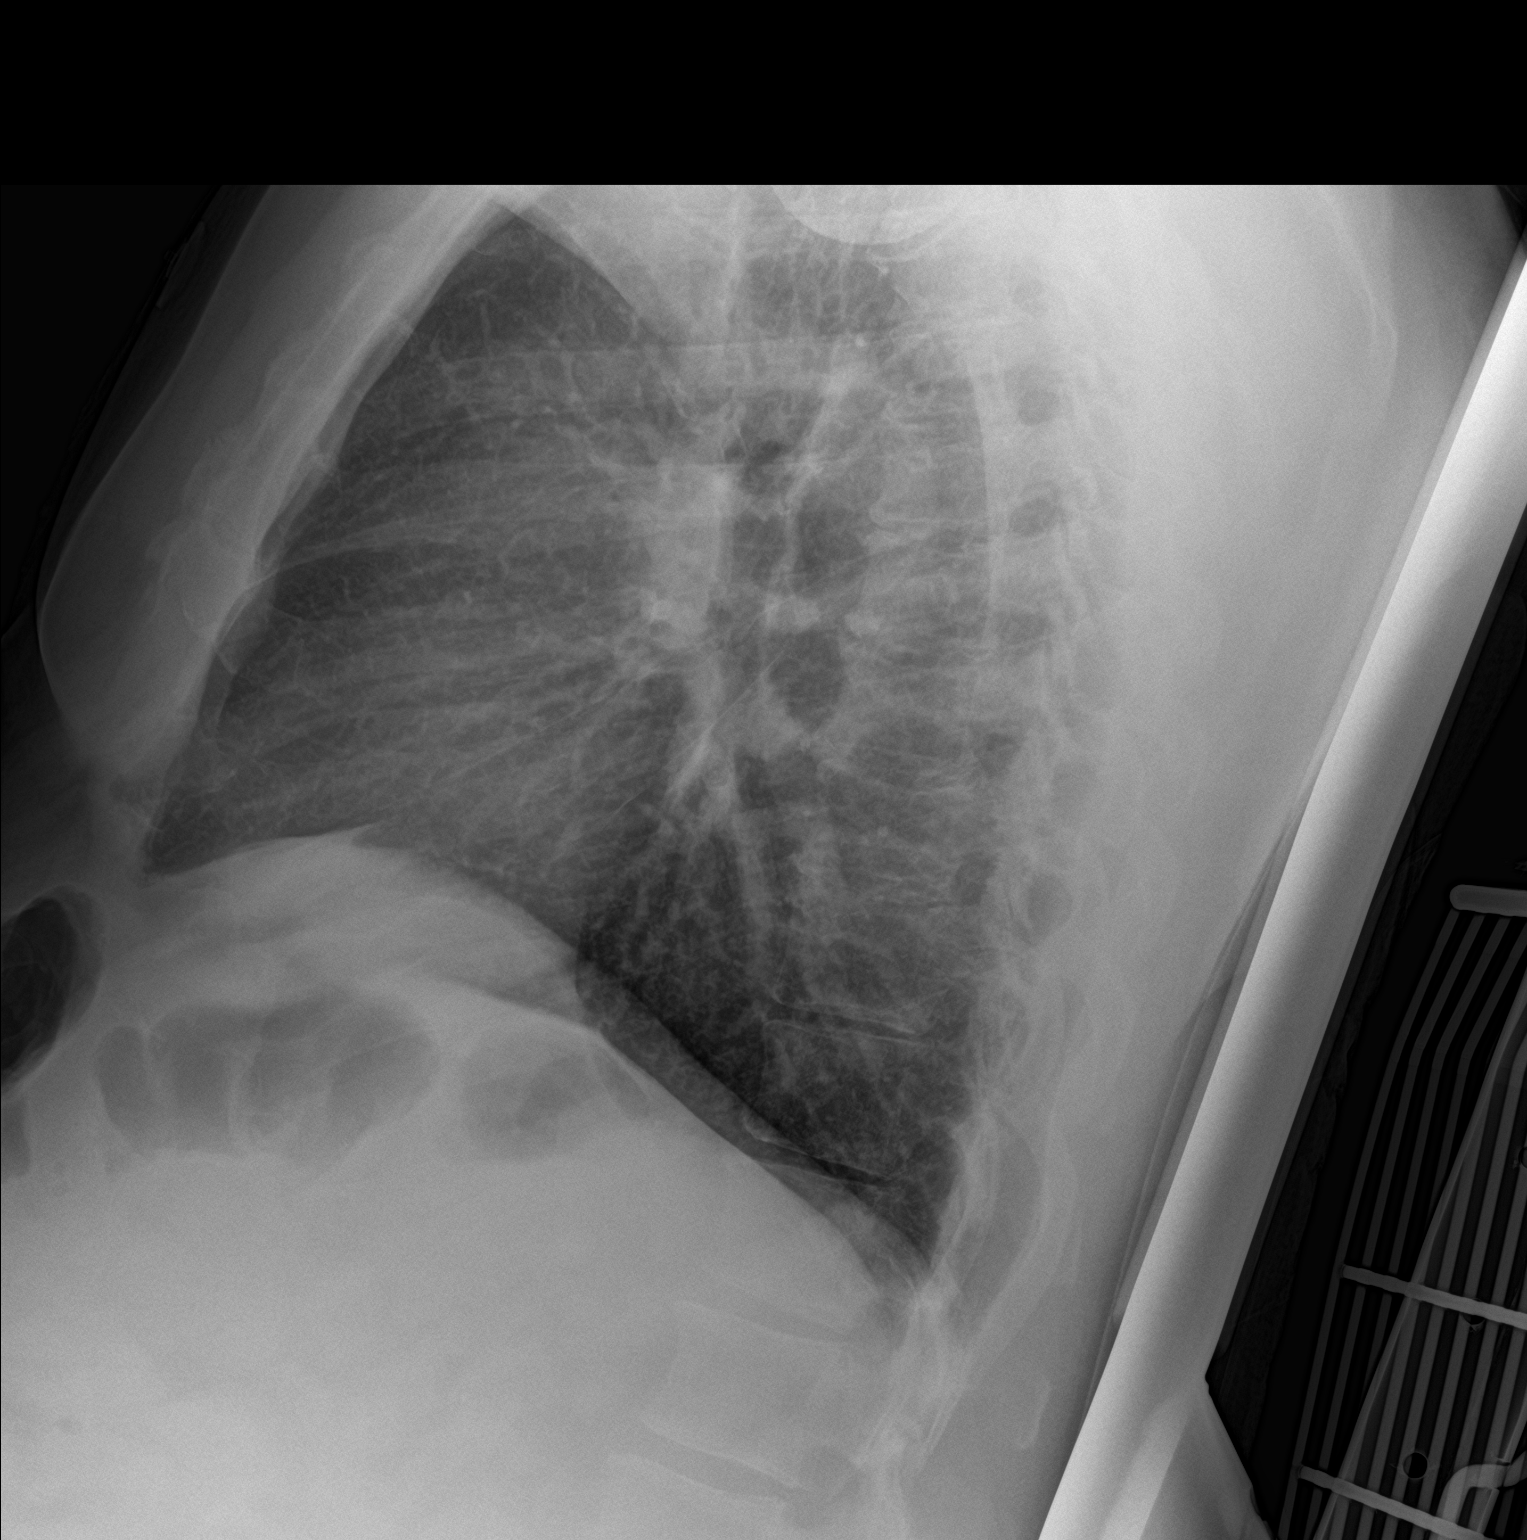

[2 of 2 positions shown; findings below may reference images not displayed]

FINDINGS: Stable cardiac and mediastinal contours. No consolidative pulmonary
opacities. No pleural effusion or pneumothorax. Mid thoracic spine
degenerative changes.
IMPRESSION: No acute cardiopulmonary process.

## 2016-07-20 MED ORDER — SODIUM CHLORIDE 0.9 % IV BOLUS (SEPSIS)
1000.0000 mL | Freq: Once | INTRAVENOUS | Status: AC
  Administered 2016-07-20: 1000 mL via INTRAVENOUS

## 2016-07-20 NOTE — ED Triage Notes (Signed)
Pt reports shortness of breath, fatigue/weakness and diarrhea since yesterday, reports congestion for 1 month. Denies cough, lung sounds clear and equal/unlabored in triage. Reports no sick contacts. Tachycardiac in triage.

## 2016-07-20 NOTE — ED Provider Notes (Signed)
[facility] DEPT Provider Note   CSN: [id number] Arrival date & time: 07/20/16  1934     History   Chief Complaint Chief Complaint  Patient presents with  . Shortness of Breath    congestion  . Diarrhea    HPI [name] is a 61 y.o. male.  Patient presents to the ED with 2 chief complaints.  1. SOB:  Patient reports SOB x 1 day.  He reports dyspnea with exertion.  States that it feels hard to catch his breath.  He denies any pain.  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  He has never had a PE/DVT.  He denies any fever, chill, or new cough.  There are no other associated symptoms.  2. Diarrhea:  Patient states that he ate some chicken at a new restaurant yesterday and has had nausea and diarrhea since.  He states that his stomach has been grumbling all day.  He has not taken anything for these symptoms.  Denies any blood in stools.  Denies any abdominal pain.   The history is provided by the patient. No language interpreter was used.    Past Medical History:  Diagnosis Date  . Hypertension     There are no active problems to display for this patient.   Past Surgical History:  Procedure Laterality Date  . CARDIAC CATHETERIZATION  2008       Home Medications    Prior to Admission medications   Medication Sig Start Date End Date Taking? Authorizing Provider  ibuprofen (ADVIL,MOTRIN) 800 MG tablet Take 800 mg by mouth every 8 (eight) hours as needed for moderate pain.    [provider]  lisinopril-hydrochlorothiazide (PRINZIDE,ZESTORETIC) 20-12.5 MG per tablet Take 1 tablet by mouth Daily. 09/30/11   [provider]  Multiple Vitamins-Minerals (MULTIVITAMIN WITH MINERALS) tablet Take 1 tablet by mouth daily.    [provider]  naproxen (NAPROSYN) 500 MG tablet Take 1 tablet (500 mg total) by mouth 2 (two) times daily. 08/16/15   Gloriann Loan, PA-C    Family History Family History  Problem Relation Age of Onset  .  Colon cancer Maternal Uncle   . Colon cancer Cousin     Social History Social History  Substance Use Topics  . Smoking status: Former Smoker    Types: Cigarettes  . Smokeless tobacco: Never Used  . Alcohol use No     Allergies   Patient has no known allergies.   Review of Systems Review of Systems  All other systems reviewed and are negative.    Physical Exam Updated Vital Signs BP (!) 139/118 (BP Location: Right Arm)   Pulse (!) 115   Temp 98.7 F (37.1 C) (Oral)   Resp 20   Ht 6' (1.829 m)   Wt 108.9 kg (240 lb)   SpO2 98%   BMI 32.55 kg/m   Physical Exam  Constitutional: He is oriented to person, place, and time. He appears well-developed and well-nourished.  HENT:  Head: Normocephalic and atraumatic.  Eyes: Conjunctivae and EOM are normal. Pupils are equal, round, and reactive to light. Right eye exhibits no discharge. Left eye exhibits no discharge. No scleral icterus.  Neck: Normal range of motion. Neck supple. No JVD present.  Cardiovascular: Regular rhythm and normal heart sounds.  Exam reveals no gallop and no friction rub.   No murmur heard. tachycardic  Pulmonary/Chest: Effort normal and breath sounds normal. No respiratory distress. He has no wheezes. He has  no rales. He exhibits no tenderness.  CTAB  Abdominal: Soft. He exhibits no distension and no mass. There is no tenderness. There is no rebound and no guarding.  No focal abdominal tenderness, no RLQ tenderness or pain at McBurney's point, no RUQ tenderness or Murphy's sign, no left-sided abdominal tenderness, no fluid wave, or signs of peritonitis   Musculoskeletal: Normal range of motion. He exhibits no edema or tenderness.  No leg swelling or calf tenderness  Neurological: He is alert and oriented to person, place, and time.  Skin: Skin is warm and dry.  Psychiatric: He has a normal mood and affect. His behavior is normal. Judgment and thought content normal.  Nursing note and vitals  reviewed.    ED Treatments / Results  Labs (all labs ordered are listed, but only abnormal results are displayed) Labs Reviewed  COMPREHENSIVE METABOLIC PANEL - Abnormal; Notable for the following:       Result Value   Sodium 134 (*)    CO2 21 (*)    Glucose, Bld 126 (*)    Creatinine, Ser 1.26 (*)    AST 47 (*)    GFR calc non Af Amer 60 (*)    All other components within normal limits  CBC WITH DIFFERENTIAL/PLATELET  D-DIMER, QUANTITATIVE (NOT AT [facility])  TROPONIN I    EKG  EKG Interpretation  Date/Time:  Tuesday July 20 2016 19:36:50 EDT Ventricular Rate:  116 PR Interval:  132 QRS Duration: 66 QT Interval:  302 QTC Calculation: 419 R Axis:   69 Text Interpretation:  Sinus tachycardia Otherwise normal ECG No significant change since last tracing Confirmed by Deno Etienne [phone]) on 07/20/2016 9:54:19 PM       Radiology Dg Chest 2 View  Result Date: 07/20/2016 CLINICAL DATA:  Patient with shortness of breath, weakness and diarrhea. EXAM: CHEST  2 VIEW COMPARISON:  Chest radiograph 04/17/2008 FINDINGS: Stable cardiac and mediastinal contours. No consolidative pulmonary opacities. No pleural effusion or pneumothorax. Mid thoracic spine degenerative changes. IMPRESSION: No acute cardiopulmonary process. Electronically Signed   By: Lovey Newcomer M.D.   On: 07/20/2016 20:54    Procedures Procedures (including critical care time)  Medications Ordered in ED Medications - No data to display   Initial Impression / Assessment and Plan / ED Course  I have reviewed the triage vital signs and the nursing notes.  Pertinent labs & imaging results that were available during my care of the patient were reviewed by me and considered in my medical decision making (see chart for details).     Patient with shortness breath and tachycardia. No chest pain. 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  Regarding diarrhea, suspect viral process. Will give some fluids and will reassess. His  abdomen is soft and nontender. Laboratory workup is consistent with mild dehydration.  1:49 AM Patient is feeling improved after fluids. D-dimer is negative. Troponin is negative. Patient does not have any chest pain. I have a low suspicion for ACS. Given normal d-dimer, PE is unlikely. He is not hypoxic.  Recommend close follow-up with PCP. I suspect that his tachycardia is related to his diarrhea and slight dehydration. This is improved somewhat fluids. I have encouraged increasing fluids at home. Patient understands and agrees plan. Return precautions given. Patient is stable and ready for discharge.  Final Clinical Impressions(s) / ED Diagnoses   Final diagnoses:  Diarrhea, unspecified type  Shortness of breath    New Prescriptions New Prescriptions   No medications on  file     Montine Circle, PA-C 07/21/16 0151    Deno Etienne, DO 07/21/16 1501

## 2016-07-21 LAB — D-DIMER, QUANTITATIVE (NOT AT ARMC): D DIMER QUANT: 0.47 ug{FEU}/mL (ref 0.00–0.50)

## 2016-07-21 LAB — TROPONIN I

## 2016-07-21 MED ORDER — ONDANSETRON HCL 4 MG PO TABS: 4.0000 mg | ORAL_TABLET | Freq: Four times a day (QID) | ORAL | 0 refills | Status: DC

## 2016-07-21 NOTE — ED Notes (Signed)
Pt stable, ambulatory, states understanding of discharge instructions 

## 2016-08-30 DIAGNOSIS — I10 Essential (primary) hypertension: Secondary | ICD-10-CM | POA: Diagnosis not present

## 2016-08-30 DIAGNOSIS — Z0000 Encounter for general adult medical examination without abnormal findings: Secondary | ICD-10-CM | POA: Diagnosis not present

## 2016-08-30 DIAGNOSIS — Z125 Encounter for screening for malignant neoplasm of prostate: Secondary | ICD-10-CM | POA: Diagnosis not present

## 2016-08-30 DIAGNOSIS — R7301 Impaired fasting glucose: Secondary | ICD-10-CM | POA: Diagnosis not present

## 2016-09-06 DIAGNOSIS — Z125 Encounter for screening for malignant neoplasm of prostate: Secondary | ICD-10-CM | POA: Diagnosis not present

## 2016-09-06 DIAGNOSIS — Z0000 Encounter for general adult medical examination without abnormal findings: Secondary | ICD-10-CM | POA: Diagnosis not present

## 2016-09-06 DIAGNOSIS — E8881 Metabolic syndrome: Secondary | ICD-10-CM | POA: Diagnosis not present

## 2016-09-06 DIAGNOSIS — I10 Essential (primary) hypertension: Secondary | ICD-10-CM | POA: Diagnosis not present

## 2016-09-06 DIAGNOSIS — R7301 Impaired fasting glucose: Secondary | ICD-10-CM | POA: Diagnosis not present

## 2016-09-06 DIAGNOSIS — E784 Other hyperlipidemia: Secondary | ICD-10-CM | POA: Diagnosis not present

## 2016-09-06 DIAGNOSIS — Z1389 Encounter for screening for other disorder: Secondary | ICD-10-CM | POA: Diagnosis not present

## 2016-09-29 ENCOUNTER — Encounter (HOSPITAL_COMMUNITY): Payer: Self-pay | Admitting: *Deleted

## 2016-09-29 ENCOUNTER — Ambulatory Visit (HOSPITAL_COMMUNITY)
Admission: EM | Admit: 2016-09-29 | Discharge: 2016-09-29 | Disposition: A | Discharge status: Home / Self Care | Payer: BLUE CROSS/BLUE SHIELD | Attending: Family Medicine | Admitting: Family Medicine

## 2016-09-29 DIAGNOSIS — M25572 Pain in left ankle and joints of left foot: Secondary | ICD-10-CM

## 2016-09-29 MED ORDER — KETOROLAC TROMETHAMINE 30 MG/ML IJ SOLN
INTRAMUSCULAR | Status: AC
  Filled 2016-09-29: qty 1

## 2016-09-29 MED ORDER — METHYLPREDNISOLONE SODIUM SUCC 125 MG IJ SOLR
125.0000 mg | Freq: Once | INTRAMUSCULAR | Status: AC
  Administered 2016-09-29: 125 mg via INTRAMUSCULAR

## 2016-09-29 MED ORDER — DICLOFENAC SODIUM 1 % TD GEL: 2.0000 g | Freq: Four times a day (QID) | TRANSDERMAL | 0 refills | Status: DC

## 2016-09-29 MED ORDER — METHYLPREDNISOLONE SODIUM SUCC 125 MG IJ SOLR
INTRAMUSCULAR | Status: AC
  Filled 2016-09-29: qty 2

## 2016-09-29 MED ORDER — KETOROLAC TROMETHAMINE 30 MG/ML IJ SOLN
30.0000 mg | Freq: Once | INTRAMUSCULAR | Status: AC
  Administered 2016-09-29: 30 mg via INTRAMUSCULAR

## 2016-09-29 NOTE — Discharge Instructions (Signed)
Continue meloxicam for inflammation and pain. Voltaren gel as needed on affected area. You were given a toradol and solumedrol injection in office for pain and inflammation. Follow exercise/stretching to help with the pain. Follow up with PCP for further evaluation/treatment and possible referrals needed.
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
## 2016-11-06 DIAGNOSIS — Z23 Encounter for immunization: Secondary | ICD-10-CM | POA: Diagnosis not present

## 2017-03-06 ENCOUNTER — Emergency Department (HOSPITAL_COMMUNITY): Payer: BLUE CROSS/BLUE SHIELD

## 2017-03-06 ENCOUNTER — Observation Stay (HOSPITAL_BASED_OUTPATIENT_CLINIC_OR_DEPARTMENT_OTHER): Payer: BLUE CROSS/BLUE SHIELD

## 2017-03-06 ENCOUNTER — Encounter (HOSPITAL_COMMUNITY): Payer: Self-pay | Admitting: Emergency Medicine

## 2017-03-06 ENCOUNTER — Other Ambulatory Visit: Payer: Self-pay

## 2017-03-06 ENCOUNTER — Observation Stay (HOSPITAL_COMMUNITY)
Admission: EM | Admit: 2017-03-06 | Discharge: 2017-03-07 | Disposition: A | Discharge status: Home / Self Care | Payer: BLUE CROSS/BLUE SHIELD | Attending: Internal Medicine | Admitting: Internal Medicine

## 2017-03-06 DIAGNOSIS — E785 Hyperlipidemia, unspecified: Secondary | ICD-10-CM | POA: Diagnosis not present

## 2017-03-06 DIAGNOSIS — Z0389 Encounter for observation for other suspected diseases and conditions ruled out: Secondary | ICD-10-CM

## 2017-03-06 DIAGNOSIS — R778 Other specified abnormalities of plasma proteins: Secondary | ICD-10-CM | POA: Diagnosis present

## 2017-03-06 DIAGNOSIS — R55 Syncope and collapse: Secondary | ICD-10-CM | POA: Diagnosis not present

## 2017-03-06 DIAGNOSIS — R739 Hyperglycemia, unspecified: Secondary | ICD-10-CM | POA: Diagnosis not present

## 2017-03-06 DIAGNOSIS — R0602 Shortness of breath: Secondary | ICD-10-CM | POA: Diagnosis not present

## 2017-03-06 DIAGNOSIS — R748 Abnormal levels of other serum enzymes: Secondary | ICD-10-CM | POA: Insufficient documentation

## 2017-03-06 DIAGNOSIS — Z87891 Personal history of nicotine dependence: Secondary | ICD-10-CM | POA: Diagnosis not present

## 2017-03-06 DIAGNOSIS — R7989 Other specified abnormal findings of blood chemistry: Secondary | ICD-10-CM | POA: Diagnosis not present

## 2017-03-06 DIAGNOSIS — IMO0001 Reserved for inherently not codable concepts without codable children: Secondary | ICD-10-CM

## 2017-03-06 DIAGNOSIS — I10 Essential (primary) hypertension: Secondary | ICD-10-CM | POA: Diagnosis present

## 2017-03-06 DIAGNOSIS — Z8739 Personal history of other diseases of the musculoskeletal system and connective tissue: Secondary | ICD-10-CM

## 2017-03-06 DIAGNOSIS — R7303 Prediabetes: Secondary | ICD-10-CM | POA: Insufficient documentation

## 2017-03-06 DIAGNOSIS — Z79899 Other long term (current) drug therapy: Secondary | ICD-10-CM | POA: Diagnosis not present

## 2017-03-06 DIAGNOSIS — Z791 Long term (current) use of non-steroidal anti-inflammatories (NSAID): Secondary | ICD-10-CM | POA: Insufficient documentation

## 2017-03-06 DIAGNOSIS — R069 Unspecified abnormalities of breathing: Secondary | ICD-10-CM | POA: Diagnosis not present

## 2017-03-06 HISTORY — DX: Hyperlipidemia, unspecified: E78.5

## 2017-03-06 HISTORY — DX: Encounter for observation for other suspected diseases and conditions ruled out: Z03.89

## 2017-03-06 HISTORY — DX: Personal history of other diseases of the musculoskeletal system and connective tissue: Z87.39

## 2017-03-06 LAB — CBC
HCT: 39 % (ref 39.0–52.0)
HEMOGLOBIN: 13.2 g/dL (ref 13.0–17.0)
MCH: 31.8 pg (ref 26.0–34.0)
MCHC: 33.8 g/dL (ref 30.0–36.0)
MCV: 94 fL (ref 78.0–100.0)
Platelets: 228 10*3/uL (ref 150–400)
RBC: 4.15 MIL/uL — AB (ref 4.22–5.81)
RDW: 13.5 % (ref 11.5–15.5)
WBC: 13.1 10*3/uL — ABNORMAL HIGH (ref 4.0–10.5)

## 2017-03-06 LAB — TROPONIN I
TROPONIN I: 0.03 ng/mL — AB (ref <0.03)
Troponin I: <0.03 ng/mL (ref <0.03)
Troponin I: <0.03 ng/mL (ref <0.03)

## 2017-03-06 LAB — BASIC METABOLIC PANEL
ANION GAP: 9 (ref 5–15)
BUN: 9 mg/dL (ref 6–20)
CO2: 23 mmol/L (ref 22–32)
Calcium: 8.7 mg/dL — ABNORMAL LOW (ref 8.9–10.3)
Chloride: 106 mmol/L (ref 101–111)
Creatinine, Ser: 1.12 mg/dL (ref 0.61–1.24)
Glucose, Bld: 124 mg/dL — ABNORMAL HIGH (ref 65–99)
Potassium: 4.2 mmol/L (ref 3.5–5.1)
Sodium: 138 mmol/L (ref 135–145)

## 2017-03-06 LAB — URINALYSIS, ROUTINE W REFLEX MICROSCOPIC
Bilirubin Urine: NEGATIVE
Glucose, UA: NEGATIVE mg/dL
Hgb urine dipstick: NEGATIVE
Ketones, ur: NEGATIVE mg/dL
LEUKOCYTES UA: NEGATIVE
NITRITE: NEGATIVE
Protein, ur: NEGATIVE mg/dL
SPECIFIC GRAVITY, URINE: 1.006 (ref 1.005–1.030)
pH: 7 (ref 5.0–8.0)

## 2017-03-06 LAB — LIPID PANEL
CHOL/HDL RATIO: 5 ratio
CHOLESTEROL: 139 mg/dL (ref 0–200)
HDL: 28 mg/dL — AB (ref >40)
LDL Cholesterol: 72 mg/dL (ref 0–99)
Triglycerides: 194 mg/dL — ABNORMAL HIGH (ref <150)
VLDL: 39 mg/dL (ref 0–40)

## 2017-03-06 LAB — CK TOTAL AND CKMB (NOT AT ARMC)
CK, MB: 1.4 ng/mL (ref 0.5–5.0)
Relative Index: 1 (ref 0.0–2.5)
Total CK: 146 U/L (ref 49–397)

## 2017-03-06 LAB — CBG MONITORING, ED: GLUCOSE-CAPILLARY: 143 mg/dL — AB (ref 65–99)

## 2017-03-06 LAB — TSH: TSH: 0.694 u[IU]/mL (ref 0.350–4.500)

## 2017-03-06 LAB — D-DIMER, QUANTITATIVE: D-Dimer, Quant: 0.31 ug/mL-FEU (ref 0.00–0.50)

## 2017-03-06 LAB — BRAIN NATRIURETIC PEPTIDE: B NATRIURETIC PEPTIDE 5: 11.2 pg/mL (ref 0.0–100.0)

## 2017-03-06 IMAGING — CR DG CHEST 2V
2 series · 2 of 2 positions shown · non-contrast
Comparison: [DATE]

CLINICAL DATA: Dizzy short of breath

EXAM:
CHEST  2 VIEW

[chest pa]
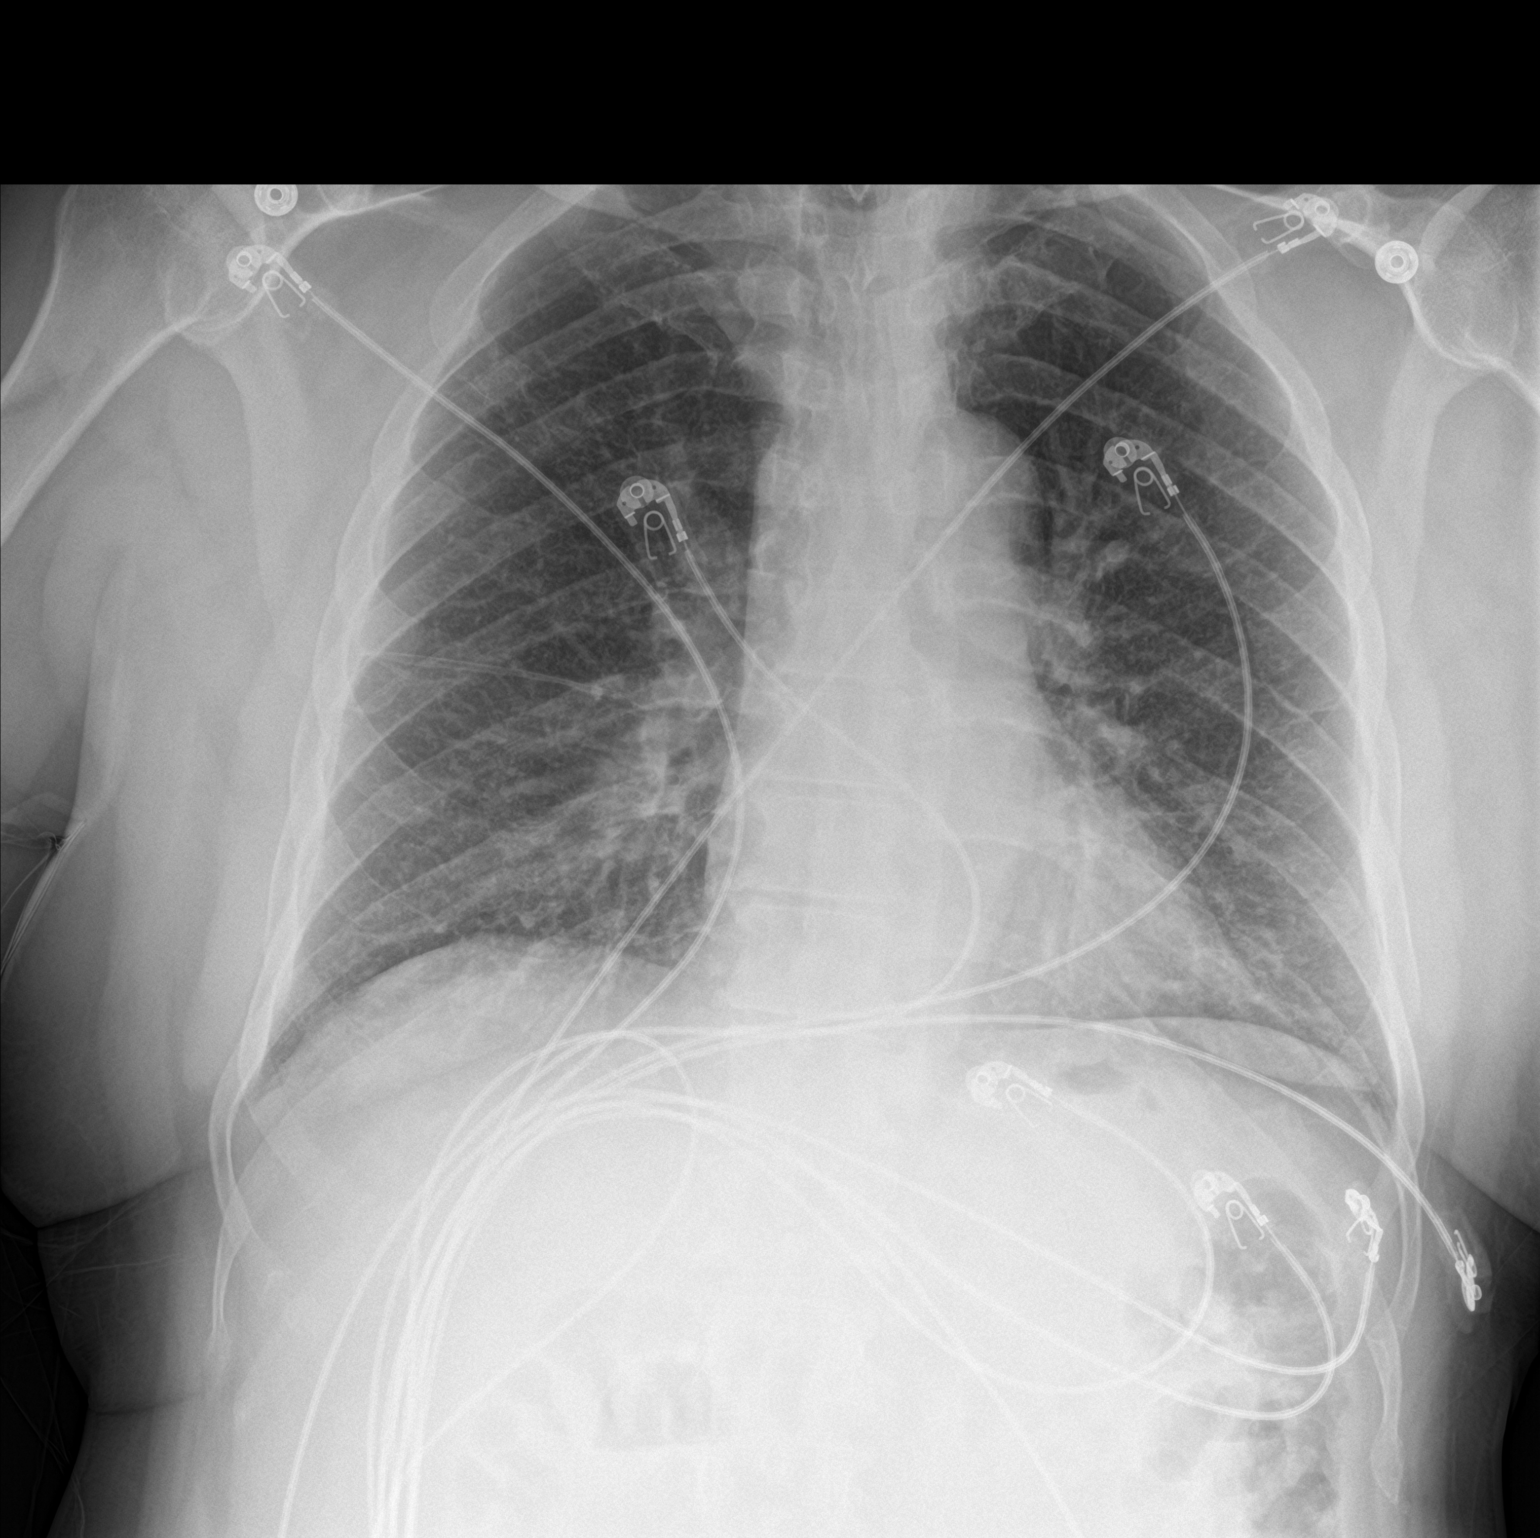

[chest lat]
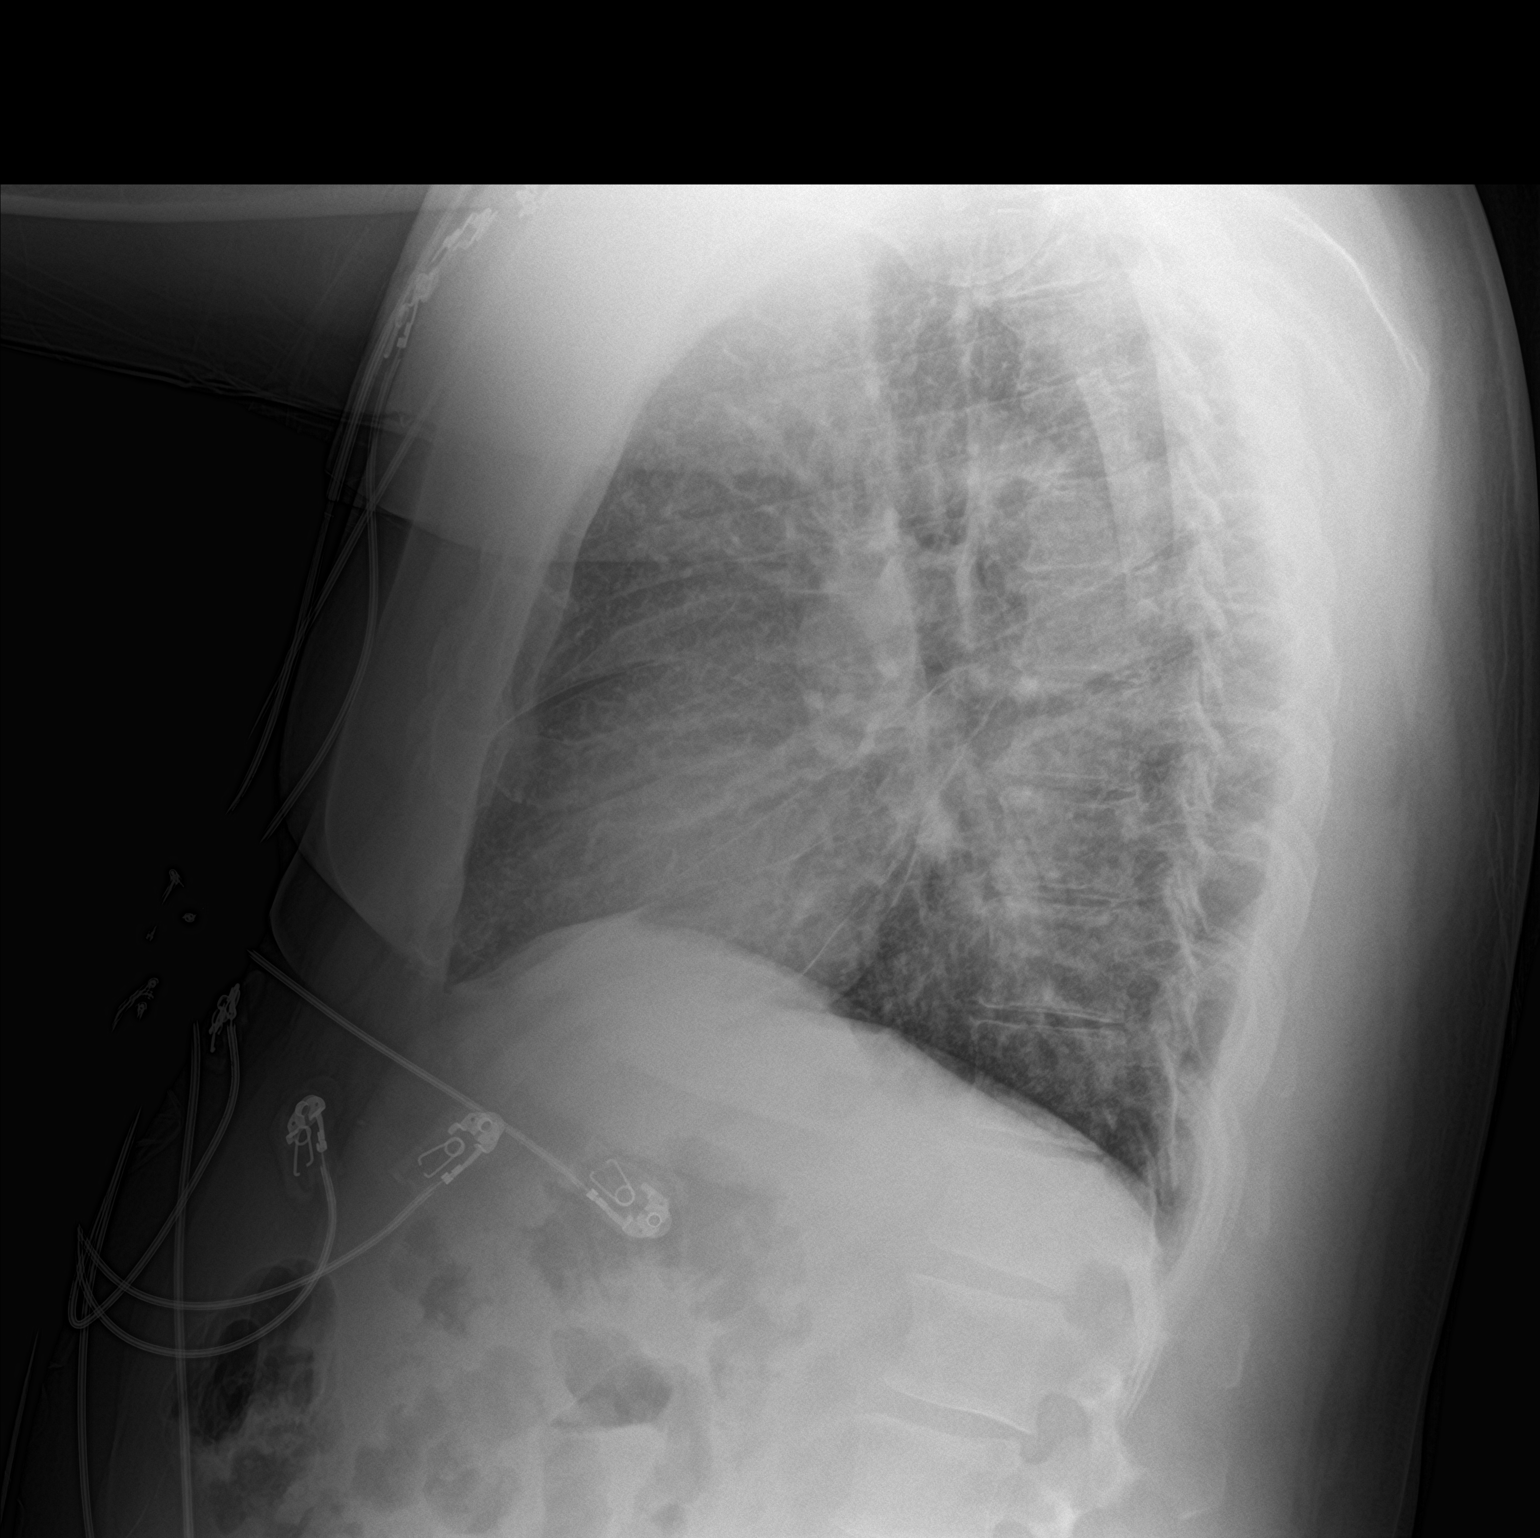

[2 of 2 positions shown; findings below may reference images not displayed]

FINDINGS: No focal pulmonary infiltrate or effusion. Streaky atelectasis at
the lingula and left base. Normal cardiomediastinal silhouette. No
pneumothorax.
IMPRESSION: No active cardiopulmonary disease. Streaky atelectasis at the left
lung base.

## 2017-03-06 MED ORDER — ACETAMINOPHEN 650 MG RE SUPP: 650.0000 mg | Freq: Four times a day (QID) | RECTAL | Status: DC | PRN

## 2017-03-06 MED ORDER — ENOXAPARIN SODIUM 40 MG/0.4ML ~~LOC~~ SOLN
40.0000 mg | SUBCUTANEOUS | Status: DC
  Administered 2017-03-06: 40 mg via SUBCUTANEOUS
  Filled 2017-03-06 (×2): qty 0.4

## 2017-03-06 MED ORDER — ASPIRIN 81 MG PO CHEW
324.0000 mg | CHEWABLE_TABLET | Freq: Once | ORAL | Status: AC
  Administered 2017-03-06: 324 mg via ORAL
  Filled 2017-03-06: qty 4

## 2017-03-06 MED ORDER — LISINOPRIL 10 MG PO TABS
20.0000 mg | ORAL_TABLET | Freq: Every day | ORAL | Status: DC
  Administered 2017-03-06 – 2017-03-07 (×2): 20 mg via ORAL
  Filled 2017-03-06: qty 4
  Filled 2017-03-06: qty 1

## 2017-03-06 MED ORDER — ACETAMINOPHEN 325 MG PO TABS: 650.0000 mg | ORAL_TABLET | Freq: Four times a day (QID) | ORAL | Status: DC | PRN

## 2017-03-06 MED ORDER — ASPIRIN EC 325 MG PO TBEC
325.0000 mg | DELAYED_RELEASE_TABLET | Freq: Every day | ORAL | Status: DC
  Administered 2017-03-07: 325 mg via ORAL
  Filled 2017-03-06 (×2): qty 1

## 2017-03-06 MED ORDER — CARVEDILOL 3.125 MG PO TABS
3.1250 mg | ORAL_TABLET | Freq: Two times a day (BID) | ORAL | Status: DC
  Administered 2017-03-06 – 2017-03-07 (×3): 3.125 mg via ORAL
  Filled 2017-03-06 (×3): qty 1

## 2017-03-06 MED ORDER — HYDROCHLOROTHIAZIDE 12.5 MG PO CAPS
12.5000 mg | ORAL_CAPSULE | Freq: Every day | ORAL | Status: DC
  Administered 2017-03-06 – 2017-03-07 (×2): 12.5 mg via ORAL
  Filled 2017-03-06 (×2): qty 1

## 2017-03-06 MED ORDER — LISINOPRIL-HYDROCHLOROTHIAZIDE 20-12.5 MG PO TABS: 1.0000 | ORAL_TABLET | Freq: Every day | ORAL | Status: DC

## 2017-03-06 MED ORDER — ATORVASTATIN CALCIUM 80 MG PO TABS
80.0000 mg | ORAL_TABLET | Freq: Every day | ORAL | Status: DC
  Administered 2017-03-06: 80 mg via ORAL
  Filled 2017-03-06: qty 1

## 2017-03-06 MED ORDER — SODIUM CHLORIDE 0.9 % IV BOLUS (SEPSIS)
1000.0000 mL | Freq: Once | INTRAVENOUS | Status: AC
  Administered 2017-03-06: 1000 mL via INTRAVENOUS

## 2017-03-06 MED ORDER — SODIUM CHLORIDE 0.9 % IV SOLN
INTRAVENOUS | Status: AC
  Administered 2017-03-06: 09:00:00 via INTRAVENOUS

## 2017-03-06 NOTE — ED Provider Notes (Signed)
[facility] EMERGENCY DEPARTMENT Provider Note   CSN: [id number] Arrival date & time: 03/06/17  0036     History   Chief Complaint Chief Complaint  Patient presents with  . Near Syncope    HPI [name] is a 62 y.o. male.  Patient from home by EMS.  States he awoke and felt lightheaded and dizzy while lying in bed.  He felt fine prior to going to bed.  He tried to go to the bathroom and felt like he was going to pass out.  He sat down before this happened.  Denies losing consciousness or hitting his head.  Developed shortness of breath and nausea that still persists.  Denies chest pain.  Denies abdominal pain, nausea or vomiting.  Denies diarrhea.  Denies focal weakness, numbness or tingling.  Denies any recent illnesses.  History of hypertension only.  Denies any recent medication changes.  Not diabetic.  Never had issues like this before.   The history is provided by the patient and the EMS personnel.  Near Syncope  Associated symptoms include shortness of breath. Pertinent negatives include no chest pain and no abdominal pain.    Past Medical History:  Diagnosis Date  . Hypertension     There are no active problems to display for this patient.   Past Surgical History:  Procedure Laterality Date  . CARDIAC CATHETERIZATION  2008       Home Medications    Prior to Admission medications   Medication Sig Start Date End Date Taking? Authorizing Provider  lisinopril-hydrochlorothiazide (PRINZIDE,ZESTORETIC) 20-12.5 MG per tablet Take 1 tablet by mouth Daily. 09/30/11  Yes [provider]  meloxicam (MOBIC) 15 MG tablet Take 15 mg by mouth daily.   Yes [provider]  Multiple Vitamins-Minerals (MULTIVITAMIN WITH MINERALS) tablet Take 1 tablet by mouth daily.   Yes [provider]  diclofenac sodium (VOLTAREN) 1 % GEL Apply 2 g topically 4 (four) times daily. Patient not taking: Reported on 03/06/2017 09/29/16   Ok Edwards, PA-C    ibuprofen (ADVIL,MOTRIN) 800 MG tablet Take 800 mg by mouth every 8 (eight) hours as needed for moderate pain.    [provider]  naproxen (NAPROSYN) 500 MG tablet Take 1 tablet (500 mg total) by mouth 2 (two) times daily. Patient not taking: Reported on 03/06/2017 08/16/15   Gloriann Loan, PA-C  ondansetron (ZOFRAN) 4 MG tablet Take 1 tablet (4 mg total) by mouth every 6 (six) hours. Patient not taking: Reported on 03/06/2017 07/21/16   Montine Circle, PA-C    Family History Family History  Problem Relation Age of Onset  . Colon cancer Maternal Uncle   . Colon cancer Cousin     Social History Social History   Tobacco Use  . Smoking status: Former Smoker    Types: Cigarettes  . Smokeless tobacco: Never Used  Substance Use Topics  . Alcohol use: No  . Drug use: No     Allergies   Patient has no known allergies.   Review of Systems Review of Systems  Constitutional: Negative for activity change, appetite change and fever.  HENT: Negative for congestion and postnasal drip.   Eyes: Negative for visual disturbance.  Respiratory: Positive for shortness of breath. Negative for chest tightness.   Cardiovascular: Positive for near-syncope. Negative for chest pain.  Gastrointestinal: Positive for nausea. Negative for abdominal pain and vomiting.  Genitourinary: Negative for testicular pain.  Musculoskeletal: Negative for arthralgias and myalgias.  Neurological: Positive for  dizziness and light-headedness. Negative for weakness.    all other systems are negative except as noted in the HPI and PMH.    Physical Exam Updated Vital Signs BP 123/77   Pulse 86   Temp 99 F (37.2 C) (Oral)   Resp 16   Ht 5\' 11"  (1.803 m)   Wt 106.6 kg (235 lb)   SpO2 97%   BMI 32.78 kg/m   Physical Exam  Constitutional: He is oriented to person, place, and time. He appears well-developed and well-nourished. No distress.  HENT:  Head: Normocephalic and atraumatic.  Mouth/Throat:  Oropharynx is clear and moist. No oropharyngeal exudate.  Eyes: Conjunctivae and EOM are normal. Pupils are equal, round, and reactive to light.  Neck: Normal range of motion. Neck supple.  No meningismus.  Cardiovascular: Normal rate, regular rhythm, normal heart sounds and intact distal pulses.  No murmur heard. Pulmonary/Chest: Effort normal and breath sounds normal. No respiratory distress. He exhibits no tenderness.  Abdominal: Soft. There is no tenderness. There is no rebound and no guarding.  Musculoskeletal: Normal range of motion. He exhibits no edema or tenderness.  Neurological: He is alert and oriented to person, place, and time. No cranial nerve deficit. He exhibits normal muscle tone. Coordination normal.  No ataxia on finger to nose bilaterally. No pronator drift. 5/5 strength throughout. CN 2-12 intact.Equal grip strength. Sensation intact.   Skin: Skin is warm.  Psychiatric: He has a normal mood and affect. His behavior is normal.  Nursing note and vitals reviewed.    ED Treatments / Results  Labs (all labs ordered are listed, but only abnormal results are displayed) Labs Reviewed  BASIC METABOLIC PANEL - Abnormal; Notable for the following components:      Result Value   Glucose, Bld 124 (*)    Calcium 8.7 (*)    All other components within normal limits  CBC - Abnormal; Notable for the following components:   WBC 13.1 (*)    RBC 4.15 (*)    All other components within normal limits  CBG MONITORING, ED - Abnormal; Notable for the following components:   Glucose-Capillary 143 (*)    All other components within normal limits  BRAIN NATRIURETIC PEPTIDE  D-DIMER, QUANTITATIVE (NOT AT [facility])  URINALYSIS, ROUTINE W REFLEX MICROSCOPIC  TROPONIN I  POC OCCULT BLOOD, ED    EKG  EKG Interpretation  Date/Time:  Sunday March 06 2017 00:44:07 EST Ventricular Rate:  90 PR Interval:    QRS Duration: 89 QT Interval:  348 QTC Calculation: 426 R Axis:   34 Text  Interpretation:  Sinus rhythm Probable left atrial enlargement Borderline T wave abnormalities No significant change was found Confirmed by Ezequiel Essex [phone]) on 03/06/2017 12:51:45 AM       Radiology Dg Chest 2 View  Result Date: 03/06/2017 CLINICAL DATA:  Dizzy short of breath EXAM: CHEST  2 VIEW COMPARISON:  07/20/2016 FINDINGS: No focal pulmonary infiltrate or effusion. Streaky atelectasis at the lingula and left base. Normal cardiomediastinal silhouette. No pneumothorax. IMPRESSION: No active cardiopulmonary disease. Streaky atelectasis at the left lung base. Electronically Signed   By: Donavan Foil M.D.   On: 03/06/2017 03:22    Procedures Procedures (including critical care time)  Medications Ordered in ED Medications  sodium chloride 0.9 % bolus 1,000 mL (0 mLs Intravenous Stopped 03/06/17 0218)     Initial Impression / Assessment and Plan / ED Course  I have reviewed the triage vital signs and the nursing notes.  Pertinent labs & imaging results that were available during my care of the patient were reviewed by me and considered in my medical decision making (see chart for details).    Patient from home with sudden onset of lightheadedness, near syncope, shortness of breath and nausea.  No chest pain.  EKG is sinus rhythm with nonspecific T wave inversion in lead III which is unchanged.  Patient with nonfocal neurological exam. Orthostatics are negative  Chest x-ray is negative.  Patient does have minimal troponin elevation.  Unclear whether this near syncope episode with lightheadedness and dizziness represents his anginal equivalent.  Did have reassuring catheterization in 2012.  Denies any chest pain or shortness of breath currently.  Continues to have some dizziness and nausea.  Observation admission discussed with Dr. Maudie Mercury.    Final Clinical Impressions(s) / ED Diagnoses   Final diagnoses:  Near syncope  Elevated troponin    ED Discharge Orders    None         Atilano Covelli, Annie Main, MD 03/06/17 [phone]

## 2017-03-06 NOTE — ED Triage Notes (Signed)
Per EMS pt from home. Awoke about 1 hour ago and felt dizzy. Went to restroom and when he came out felt like he was going to "pass out"  Sat down instead. No fall.  At the time a brief episode of shob and nausea was noted that sent resolved. VSS 130/82, 94HR, RR18, 96% on RA.  CBG 147.  No recent illness or exposed to anyone sick.

## 2017-03-06 NOTE — ED Notes (Signed)
Cards at bedside
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
## 2017-03-06 NOTE — ED Notes (Signed)
Pt.ambulated no problem while walking

## 2017-03-06 NOTE — H&P (Signed)
TRH H&P   Patient Demographics:    [name], is a 62 y.o. male  MRN: [id number]   DOB - [birth year]  Admit Date - 03/06/2017  Outpatient Primary MD for the patient is Marton Redwood, MD  Referring MD/NP/PA: Ezequiel Essex  Outpatient Specialists:    Angelena Sole did his prior cath on 05/27/2010  Patient coming from: home  Chief Complaint  Patient presents with  . Near Syncope      HPI:    [name]  is a 62 y.o. male, w hypertension w c/o dizziness, and slight dyspnea.  "felt like I was going to pass out"  Pt states felt "tightness " in the middle of his chest.  No radiation.  Slight nausea, epigastric /stomach upset.   Denies fever, chills, cough, emesis, diarrhea, brbpr, black stool, dysuria, hematuria.  Pt presented to ED due to dizziness, dyspnea and chest tightness.   In ED,     Review of systems:    PRIOR Cardiac catheterization 05/27/2010   FINDINGS:  1. Left Main:  Normal.  2. LAD:  Mild luminal irregularities with distal intramyocardial      bridge noted.  3. D1:  Mild-to-moderate luminal irregularities.  4. LCX:  Nondominant with mild luminal irregularities.  5. OM-1/OM-2/OM-3:  All with mild luminal irregularities.  6. RCA:  Dominant with mild luminal irregularities.  7. LV:  EF 60%.  No wall motion abnormalities.  LVEDP is 15 mmHg.  8. Limited right femoral angiogram shows no evidence of peripheral vascular disease, no evidence of dissection, and no extravasation of dye.  Arteriotomy site would be adequate for closure; however, 5- [place] sheath was used.   IMPRESSION:  1. Normal-appearing coronary arteries with mild luminal      irregularities, intramyocardial bridge noted in the very distal      left anterior descending.  2. Preserved left ventricular systolic function.   In addition to the HPI above,  No Fever-chills, No Headache, No changes  with Vision or hearing, No problems swallowing food or Liquids, No Cough  No Blood in stool or Urine, No dysuria, No new skin rashes or bruises, No new joints pains-aches,  No new weakness, tingling, numbness in any extremity, No recent weight gain or loss, No polyuria, polydypsia or polyphagia, No significant Mental Stressors.  A full 10 point Review of Systems was done, except as stated above, all other Review of Systems were negative.   With Past History of the following :    Past Medical History:  Diagnosis Date  . Hypertension       Past Surgical History:  Procedure Laterality Date  . CARDIAC CATHETERIZATION  2008      Social History:     Social History   Tobacco Use  . Smoking status: Former Smoker    Types: Cigarettes  . Smokeless tobacco: Never Used  Substance Use Topics  .  Alcohol use: No     Lives - at home w daughter  Mobility - walks by self   Family History :     Family History  Problem Relation Age of Onset  . Colon cancer Maternal Uncle   . Colon cancer Cousin   . Dementia Mother   . Alcohol abuse Father       Home Medications:   Prior to Admission medications   Medication Sig Start Date End Date Taking? Authorizing Provider  lisinopril-hydrochlorothiazide (PRINZIDE,ZESTORETIC) 20-12.5 MG per tablet Take 1 tablet by mouth Daily. 09/30/11  Yes [provider]  meloxicam (MOBIC) 15 MG tablet Take 15 mg by mouth daily.   Yes [provider]  Multiple Vitamins-Minerals (MULTIVITAMIN WITH MINERALS) tablet Take 1 tablet by mouth daily.   Yes [provider]  diclofenac sodium (VOLTAREN) 1 % GEL Apply 2 g topically 4 (four) times daily. Patient not taking: Reported on 03/06/2017 09/29/16   Ok Edwards, PA-C  ibuprofen (ADVIL,MOTRIN) 800 MG tablet Take 800 mg by mouth every 8 (eight) hours as needed for moderate pain.    [provider]  naproxen (NAPROSYN) 500 MG tablet Take 1 tablet (500 mg total) by mouth 2  (two) times daily. Patient not taking: Reported on 03/06/2017 08/16/15   Gloriann Loan, PA-C  ondansetron (ZOFRAN) 4 MG tablet Take 1 tablet (4 mg total) by mouth every 6 (six) hours. Patient not taking: Reported on 03/06/2017 07/21/16   Montine Circle, PA-C     Allergies:    No Known Allergies   Physical Exam:   Vitals  Blood pressure 122/86, pulse 84, temperature 98 F (36.7 C), temperature source Oral, resp. rate (!) 27, height 5\' 11"  (1.803 m), weight 106.6 kg (235 lb), SpO2 96 %.   1. General  lying in bed in NAD,   2. Normal affect and insight, Not Suicidal or Homicidal, Awake Alert, Oriented X 3.  3. No F.N deficits, ALL C.Nerves Intact, Strength 5/5 all 4 extremities, Sensation intact all 4 extremities, Plantars down going.  4. Ears and Eyes appear Normal, Conjunctivae clear, PERRLA. Moist Oral Mucosa.  5. Supple Neck, No JVD, No cervical lymphadenopathy appriciated, No Carotid Bruits.  6. Symmetrical Chest wall movement, Good air movement bilaterally, CTAB.  7. RRR, No Gallops, Rubs or Murmurs, No Parasternal Heave.  8. Positive Bowel Sounds, Abdomen Soft, No tenderness, No organomegaly appriciated,No rebound -guarding or rigidity.  9.  No Cyanosis, Normal Skin Turgor, No Skin Rash or Bruise.  10. Good muscle tone,  joints appear normal , no effusions, Normal ROM.  11. No Palpable Lymph Nodes in Neck or Axillae     Data Review:    CBC Recent Labs  Lab 03/06/17 0045  WBC 13.1*  HGB 13.2  HCT 39.0  PLT 228  MCV 94.0  MCH 31.8  MCHC 33.8  RDW 13.5   ------------------------------------------------------------------------------------------------------------------  Chemistries  Recent Labs  Lab 03/06/17 0045  NA 138  K 4.2  CL 106  CO2 23  GLUCOSE 124*  BUN 9  CREATININE 1.12  CALCIUM 8.7*   ------------------------------------------------------------------------------------------------------------------ estimated creatinine clearance is 84.9  mL/min (by C-G formula based on SCr of 1.12 mg/dL). ------------------------------------------------------------------------------------------------------------------ No results for input(s): TSH, T4TOTAL, T3FREE, THYROIDAB in the last 72 hours.  Invalid input(s): FREET3  Coagulation profile No results for input(s): INR, PROTIME in the last 168 hours. ------------------------------------------------------------------------------------------------------------------- Recent Labs    03/06/17 0154  DDIMER 0.31   -------------------------------------------------------------------------------------------------------------------  Cardiac Enzymes Recent Labs  Lab  03/06/17 0304  TROPONINI 0.03*   ------------------------------------------------------------------------------------------------------------------    Component Value Date/Time   BNP 11.2 03/06/2017 0045     ---------------------------------------------------------------------------------------------------------------  Urinalysis    Component Value Date/Time   COLORURINE YELLOW 03/06/2017 0045   APPEARANCEUR CLEAR 03/06/2017 0045   LABSPEC 1.006 03/06/2017 0045   PHURINE 7.0 03/06/2017 0045   GLUCOSEU NEGATIVE 03/06/2017 0045   HGBUR NEGATIVE 03/06/2017 0045   BILIRUBINUR NEGATIVE 03/06/2017 0045   KETONESUR NEGATIVE 03/06/2017 0045   PROTEINUR NEGATIVE 03/06/2017 0045   NITRITE NEGATIVE 03/06/2017 0045   LEUKOCYTESUR NEGATIVE 03/06/2017 0045    ----------------------------------------------------------------------------------------------------------------   Imaging Results:    Dg Chest 2 View  Result Date: 03/06/2017 CLINICAL DATA:  Dizzy short of breath EXAM: CHEST  2 VIEW COMPARISON:  07/20/2016 FINDINGS: No focal pulmonary infiltrate or effusion. Streaky atelectasis at the lingula and left base. Normal cardiomediastinal silhouette. No pneumothorax. IMPRESSION: No active cardiopulmonary disease. Streaky  atelectasis at the left lung base. Electronically Signed   By: Donavan Foil M.D.   On: 03/06/2017 03:22    nsr at 90, nl axis, nl pr , nl qtc, poor R progression, no st-t changes c/w ischemia   Assessment & Plan:    Active Problems:   Near syncope   Elevated troponin   Hyperglycemia    Near syncope Tele Trop I q6h x3 Carotid ultrasound Cardiac echo  Chest pain " tightness" and mild Troponin elevation Aspirin, carvedilol Lipitor Check lipid Cardiology consult for evaluation (email placed), appreciate input  Dyspnea ? Check cardiac echo PFT as outpatient  Hyperglycemia Check hga1c      DVT Prophylaxis    Lovenox - SCDs   AM Labs Ordered, also please review Full Orders  Family Communication: Admission, patients condition and plan of care including tests being ordered have been discussed with the patient  who indicate understanding and agree with the plan and Code Status.  Code Status FULL CODE  Likely DC to  home  Condition GUARDED    Consults called: cardiology by email  Admission status: observation  Time spent in minutes : 45   Jani Gravel M.D on 03/06/2017 at 5:34 AM  Between 7am to 7pm - Pager - [phone]   After 7pm go to [email or web address] - password [facility]  [facility] - Office  [phone]

## 2017-03-06 NOTE — Progress Notes (Signed)
[name]  is a 62 y.o. male, w hypertension c/o dizziness, and slight dyspnea.  "felt like I was going to pass out"  Pt states felt "tightness " in the middle of his chest.  No radiation.  Slight nausea, epigastric /stomach upset. Denies fever, chills, cough, emesis, diarrhea, brbpr, black stool, dysuria, hematuria.  Pt presented to ED due to dizziness, dyspnea and chest tightness. Admitted for presyncope. Cardiology consulted and following. Please refer to cardiology consult note from 03/06/17.  At the time of this visit the patient was asymptomatic and his symptoms had resolved.  Reports returning from lengthy trip around 1700 on the evening of his presentation.  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 Troponin negative x 2.   Please refer to Dr Maudie Mercury for detailed assessment and plan.

## 2017-03-06 NOTE — Progress Notes (Signed)
Carotid duplex prelim: 1-39% ICA stenosis.  Avantika Shere Eunice, RDMS, RVT   

## 2017-03-07 ENCOUNTER — Observation Stay (HOSPITAL_BASED_OUTPATIENT_CLINIC_OR_DEPARTMENT_OTHER): Payer: BLUE CROSS/BLUE SHIELD

## 2017-03-07 ENCOUNTER — Other Ambulatory Visit (HOSPITAL_COMMUNITY): Payer: Self-pay

## 2017-03-07 DIAGNOSIS — E785 Hyperlipidemia, unspecified: Secondary | ICD-10-CM

## 2017-03-07 DIAGNOSIS — Z8739 Personal history of other diseases of the musculoskeletal system and connective tissue: Secondary | ICD-10-CM

## 2017-03-07 DIAGNOSIS — I10 Essential (primary) hypertension: Secondary | ICD-10-CM | POA: Diagnosis not present

## 2017-03-07 DIAGNOSIS — Z0389 Encounter for observation for other suspected diseases and conditions ruled out: Secondary | ICD-10-CM

## 2017-03-07 DIAGNOSIS — R55 Syncope and collapse: Secondary | ICD-10-CM | POA: Diagnosis not present

## 2017-03-07 DIAGNOSIS — R739 Hyperglycemia, unspecified: Secondary | ICD-10-CM | POA: Diagnosis not present

## 2017-03-07 DIAGNOSIS — R748 Abnormal levels of other serum enzymes: Secondary | ICD-10-CM | POA: Diagnosis not present

## 2017-03-07 LAB — ECHOCARDIOGRAM COMPLETE
AVLVOTPG: 6 mmHg
Area-P 1/2: 1.48 cm2
E decel time: 324 msec
EERAT: 9.87
FS: 45 % — AB (ref 28–44)
Height: 71 in
IV/PV OW: 1.25
LA diam index: 1.82 cm/m2
LASIZE: 42 mm
LAVOL: 47.7 mL
LAVOLA4C: 49.6 mL
LAVOLIN: 20.7 mL/m2
LEFT ATRIUM END SYS DIAM: 42 mm
LV E/e' medial: 9.87
LV PW d: 12 mm — AB (ref 0.6–1.1)
LV SIMPSON'S DISK: 73
LV dias vol: 97 mL (ref 62–150)
LV e' LATERAL: 8.59 cm/s
LVDIAVOLIN: 42 mL/m2
LVEEAVG: 9.87
LVOT SV: 84 mL
LVOT VTI: 26.8 cm
LVOT area: 3.14 cm2
LVOT peak vel: 125 cm/s
LVOTD: 20 mm
LVSYSVOL: 27 mL
LVSYSVOLIN: 12 mL/m2
Lateral S' vel: 16.4 cm/s
MV Dec: 324
MVPG: 3 mmHg
MVPKAVEL: 109 m/s
MVPKEVEL: 84.8 m/s
MVSPHT: 95 ms
Stroke v: 70 ml
TAPSE: 23.5 mm
TDI e' lateral: 8.59
TDI e' medial: 6.39
Weight: 3659.2 oz

## 2017-03-07 LAB — CBC
HCT: 41.2 % (ref 39.0–52.0)
Hemoglobin: 13.3 g/dL (ref 13.0–17.0)
MCH: 30.6 pg (ref 26.0–34.0)
MCHC: 32.3 g/dL (ref 30.0–36.0)
MCV: 94.7 fL (ref 78.0–100.0)
PLATELETS: 233 10*3/uL (ref 150–400)
RBC: 4.35 MIL/uL (ref 4.22–5.81)
RDW: 13.7 % (ref 11.5–15.5)
WBC: 8.1 10*3/uL (ref 4.0–10.5)

## 2017-03-07 LAB — COMPREHENSIVE METABOLIC PANEL
ALBUMIN: 3 g/dL — AB (ref 3.5–5.0)
ALK PHOS: 58 U/L (ref 38–126)
ALT: 53 U/L (ref 17–63)
AST: 37 U/L (ref 15–41)
Anion gap: 11 (ref 5–15)
BUN: 8 mg/dL (ref 6–20)
CHLORIDE: 108 mmol/L (ref 101–111)
CO2: 21 mmol/L — AB (ref 22–32)
CREATININE: 0.93 mg/dL (ref 0.61–1.24)
Calcium: 8.8 mg/dL — ABNORMAL LOW (ref 8.9–10.3)
GFR calc non Af Amer: >60 mL/min (ref >60)
GLUCOSE: 97 mg/dL (ref 65–99)
Potassium: 3.7 mmol/L (ref 3.5–5.1)
SODIUM: 140 mmol/L (ref 135–145)
Total Bilirubin: 0.6 mg/dL (ref 0.3–1.2)
Total Protein: 6.5 g/dL (ref 6.5–8.1)

## 2017-03-07 LAB — HEMOGLOBIN A1C
Hgb A1c MFr Bld: 6.3 % — ABNORMAL HIGH (ref 4.8–5.6)
Mean Plasma Glucose: 134.11 mg/dL

## 2017-03-07 LAB — HIV ANTIBODY (ROUTINE TESTING W REFLEX): HIV SCREEN 4TH GENERATION: NONREACTIVE

## 2017-03-07 MED ORDER — ATORVASTATIN CALCIUM 40 MG PO TABS: 40.0000 mg | ORAL_TABLET | Freq: Every day | ORAL | 0 refills | Status: DC

## 2017-03-07 MED ORDER — LISINOPRIL 40 MG PO TABS: 40.0000 mg | ORAL_TABLET | Freq: Every day | ORAL | Status: DC

## 2017-03-07 MED ORDER — ASPIRIN EC 81 MG PO TBEC: 81.0000 mg | DELAYED_RELEASE_TABLET | Freq: Every day | ORAL | Status: DC

## 2017-03-07 MED ORDER — ATORVASTATIN CALCIUM 40 MG PO TABS
40.0000 mg | ORAL_TABLET | Freq: Every day | ORAL | Status: DC
  Filled 2017-03-07: qty 1

## 2017-03-07 MED ORDER — HYDROCHLOROTHIAZIDE 25 MG PO TABS: 25.0000 mg | ORAL_TABLET | Freq: Every day | ORAL | Status: DC

## 2017-03-07 MED ORDER — ASPIRIN 81 MG PO TBEC: 81.0000 mg | DELAYED_RELEASE_TABLET | Freq: Every day | ORAL | 0 refills | Status: DC

## 2017-03-07 NOTE — Discharge Instructions (Signed)
Heart Disease Prevention Heart disease is a leading cause of death. There are many things you can do to help prevent heart disease. Be physically active Physical activity is good for your heart. It helps control your blood pressure, cholesterol levels, and weight. Try to be physically active every day. Ask your health care provider what activities are best for you. Be a healthy weight Extra weight can strain your heart and affect your blood pressure and cholesterol levels. Lose weight with diet and exercise if recommended by your health care provider. Eat heart-healthy foods Follow a healthy eating plan as recommended by your health care provider or dietitian. Heart-healthy foods include:  High-fiber foods. These include oat bran, oatmeal, and whole-grain breads and cereals.  Fruits and vegetables.  Avoid:  Alcohol.  Fried foods.  Foods high in saturated fat. These include meats, butter, whole dairy products, shortening, and coconut or palm oil.  Salty foods. These include canned food, luncheon meat, salty snacks, and fast food.  Keep your cholesterol levels under control Cholesterol is a substance that is used for many important functions. When your cholesterol levels are high, cholesterol can stick to the insides of your blood vessels, making them narrow or clog. This can lead to chest pain (angina) and a heart attack. Keep your cholesterol levels under control as recommended by your health care provider. Have your cholesterol checked at least once a year. Target cholesterol levels (in mg/dL) for most people are:  Total cholesterol below 200.  LDL cholesterol below 100.  HDL cholesterol above 40 in men and above 50 in women.  Triglycerides below 150.  Keep your blood pressure under control Having high blood pressure (hypertension) puts you at risk for stroke and other forms of heart disease. Keep your blood pressure under control as recommended by your health care provider. Ask  your health care provider if you need treatment to lower your blood pressure. If you are 18-39 years of age, have your blood pressure checked every 3-5 years. If you are 40 years of age or older, have your blood pressure checked every year. Do not use tobacco products Tobacco smoke can damage your heart and blood vessels. Do not use any tobacco products including cigarettes, chewing tobacco, or electronic cigarettes. If you need help quitting, ask your health care provider. Take medicines as directed Take medicines only as directed by your health care provider. Ask your health care provider whether you should take an aspirin every day. Taking aspirin can help reduce your risk of heart disease and stroke. Where to find more information: To find out more about heart disease, visit the American Heart Association's website at www.americanheart.org This information is not intended to replace advice given to you by your health care provider. Make sure you discuss any questions you have with your health care provider. Document Released: 08/26/2003 Document Revised: 06/11/2015 Document Reviewed: 03/07/2013 Elsevier Interactive Patient Education  2018 Elsevier Inc.  

## 2017-03-07 NOTE — Plan of Care (Signed)
No further c/o cp.  Trop negative.  Will have stress test as outpatient.  DC instructions given at this time.  Pt verbalized understanding of all instructions.

## 2017-03-07 NOTE — Discharge Summary (Addendum)
Discharge Summary  [name] WGN:[id number] DOB: 01/14/1956  PCP: Marton Redwood, MD  Admit date: 03/06/2017 Discharge date: 03/07/2017  Time spent: 25 minutes  Recommendations for Outpatient Follow-up:  1. Follow up with cardiology post hospitalization 2. Follow up with PCP post hospitalization 3. Take your medications as prescribed  Discharge Diagnoses:  Active Hospital Problems   Diagnosis Date Noted  . Near syncope 03/06/2017  . Elevated troponin 03/06/2017  . Hyperglycemia 03/06/2017  . Hypertension   . Dyslipidemia   . Normal coronary arteries 12/26/2007    Resolved Hospital Problems  No resolved problems to display.    Discharge Condition: stable  Diet recommendation: resume previous diet   Vitals:   03/07/17 1229 03/07/17 1300  BP: (!) 154/92   Pulse: 67   Resp: 15 18  Temp: 98.3 F (36.8 C)   SpO2: 98%     History of present illness:  BobbyJonesis a61 y.o.male,w hypertension c/o dizziness, and slight dyspnea. "felt like I was going to pass out" Pt states felt "tightness " in the middle of his chest. No radiation. Slight nausea, epigastric /stomach upset. Denies fever, chills, cough, emesis, diarrhea, brbpr, black stool, dysuria, hematuria. Pt presented to ED due to dizziness, dyspnea and chest tightness. Admitted for presyncope. Cardiology consulted and followed. Troponin negative x3 carotid artery dopplers unremarkable, 2D echo done 03/07/17 awaiting results. If negative will have lexiscan myoview stress test outpatient per cardiology.  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 Troponin negative x 3.   On the day of discharge the patient was hemodynamically stable. He will need to follow up with cardiology and PCP post hospitalization.    Hospital Course:  Principal Problem:   Near syncope Active Problems:   Elevated troponin  Hyperglycemia   Hypertension   Normal coronary arteries   Dyslipidemia  Near syncope Telemetry monitoring Trop I q6h x3 negative Carotid ultrasound unremarkable Cardiac echo 03/07/17 results pending  Chest pain r/o acs, ACS ruled out continue Aspirin, Lipitor Lipid panel ldl 72 (03/06/17) Cardiology consulted and followed  Follow up with cardiology post hospitalization  Dyspnea 2D echo-results pending Not a current smpoker Saturates well on ambient air    Procedures:  2D echo  Consultations:  cardiology  Discharge Exam: BP (!) 154/92 (BP Location: Left Arm)   Pulse 67   Temp 98.3 F (36.8 C) (Oral)   Resp 18   Ht 5\' 11"  (1.803 m)   Wt 103.7 kg (228 lb 11.2 oz)   SpO2 98%   BMI 31.90 kg/m   General: 62 yo AAM WD wN NAD A&O x 3  Cardiovascular: RRR no rubs or gallops  Respiratory: CTA no wheezes or rales  Discharge Instructions You were cared for by a hospitalist during your hospital stay. If you have any questions about your discharge medications or the care you received while you were in the hospital after you are discharged, you can call the unit and asked to speak with the hospitalist on call if the hospitalist that took care of you is not available. Once you are discharged, your primary care physician will handle any further medical issues. Please note that NO REFILLS for any discharge medications will be authorized once you are discharged, as it is imperative that you return to your primary care physician (or establish a relationship with a primary care physician if you do not have one) for your aftercare needs so  that they can reassess your need for medications and monitor your lab values.   Allergies as of 03/07/2017   No Known Allergies     Medication List    STOP taking these medications   ibuprofen 800 MG tablet Commonly known as:  ADVIL,MOTRIN   naproxen 500 MG tablet Commonly known as:  NAPROSYN     TAKE these medications   aspirin 81 MG EC  tablet Take 1 tablet (81 mg total) by mouth daily. Start taking on:  03/08/2017   atorvastatin 40 MG tablet Commonly known as:  LIPITOR Take 1 tablet (40 mg total) by mouth daily at 6 PM.   diclofenac sodium 1 % Gel Commonly known as:  VOLTAREN Apply 2 g topically 4 (four) times daily.   lisinopril-hydrochlorothiazide 20-12.5 MG tablet Commonly known as:  PRINZIDE,ZESTORETIC Take 1 tablet by mouth Daily.   meloxicam 15 MG tablet Commonly known as:  MOBIC Take 15 mg by mouth daily.   multivitamin with minerals tablet Take 1 tablet by mouth daily.   ondansetron 4 MG tablet Commonly known as:  ZOFRAN Take 1 tablet (4 mg total) by mouth every 6 (six) hours.      No Known Allergies Follow-up Information    Marton Redwood, MD Follow up.   Specialty:  Internal Medicine Contact information: [place] [address] [phone]        Skeet Latch, MD Follow up.   Specialty:  Cardiology Contact information: [address] [place] [address] [phone]            The results of significant diagnostics from this hospitalization (including imaging, microbiology, ancillary and laboratory) are listed below for reference.    Significant Diagnostic Studies:   Microbiology: No results found for this or any previous visit (from the past 240 hour(s)).   Labs: Basic Metabolic Panel: Recent Labs  Lab 03/06/17 0045 03/07/17 0230  NA 138 140  K 4.2 3.7  CL 106 108  CO2 23 21*  GLUCOSE 124* 97  BUN 9 8  CREATININE 1.12 0.93  CALCIUM 8.7* 8.8*   Liver Function Tests: Recent Labs  Lab 03/07/17 0230  AST 37  ALT 53  ALKPHOS 58  BILITOT 0.6  PROT 6.5  ALBUMIN 3.0*   No results for input(s): LIPASE, AMYLASE in the last 168 hours. No results for input(s): AMMONIA in the last 168 hours. CBC: Recent Labs  Lab 03/06/17 0045 03/07/17 0230  WBC 13.1* 8.1  HGB 13.2 13.3  HCT 39.0 41.2  MCV 94.0 94.7  PLT 228 233    Cardiac Enzymes: Recent Labs  Lab 03/06/17 0304 03/06/17 0553 03/06/17 1317 03/06/17 1837  CKTOTAL  --  146  --   --   CKMB  --  1.4  --   --   TROPONINI 0.03* <0.03 <0.03 <0.03   BNP: BNP (last 3 results) Recent Labs    03/06/17 0045  BNP 11.2    ProBNP (last 3 results) No results for input(s): PROBNP in the last 8760 hours.  CBG: Recent Labs  Lab 03/06/17 0102  GLUCAP 143*       Signed:  Kayleen Memos, MD [facility] 03/07/2017, 2:32 PM

## 2017-03-07 NOTE — Progress Notes (Signed)
*  PRELIMINARY RESULTS* Echocardiogram 2D Echocardiogram has been performed.  [name] 03/07/2017, 2:05 PM

## 2017-03-07 NOTE — Progress Notes (Signed)
Progress Note  Patient Name: [name] Date of Encounter: 03/07/2017 Primary Cardiologist: New PCP: Marton Redwood MD  Subjective   Patient feels well today, only complaint is being hungry. No more episodes of feeling like he's going to pass out and has been able to ambulate to and from the bathroom without issue. No chest pain or difficulty breathing. Denied leg swelling or pain.   Inpatient Medications    Scheduled Meds: . aspirin EC  325 mg Oral Daily  . atorvastatin  80 mg Oral q1800  . carvedilol  3.125 mg Oral BID WC  . enoxaparin (LOVENOX) injection  40 mg Subcutaneous Q24H  . lisinopril  20 mg Oral Daily   And  . hydrochlorothiazide  12.5 mg Oral Daily   Continuous Infusions:  PRN Meds: acetaminophen **OR** acetaminophen   Vital Signs    Vitals:   03/07/17 0800 03/07/17 0900 03/07/17 1000 03/07/17 1100  BP: (!) 148/84     Pulse:      Resp: 15 12 16 20   Temp: 98.4 F (36.9 C)     TempSrc: Oral     SpO2: 99% 98% 96%   Weight:      Height:        Intake/Output Summary (Last 24 hours) at 03/07/2017 1137 Last data filed at 03/07/2017 0900 Gross per 24 hour  Intake 30 ml  Output 600 ml  Net -570 ml   Filed Weights   03/06/17 0044 03/06/17 1729 03/07/17 0359  Weight: 235 lb (106.6 kg) 229 lb 15 oz (104.3 kg) 228 lb 11.2 oz (103.7 kg)    Telemetry    NSR - Personally Reviewed  Physical Exam   GEN: Pleasant obese AA male resting comfortably in bed talking on phone. No acute distress.  Keeps asking when he can eat. Neck: No JVD or carotid bruits.  Cardiac: RRR, no murmurs. Respiratory: Clear to auscultation bilaterally, no wheezing or rales.  GI: Obese abdomen. Soft, nontender, non-distended  MS: Extremities warm and perfused. No edema. No tenderness. Neuro: Moves all extremities spontaneously.   Psych: Normal affect, answers questions appropriately.   Labs    Chemistry Recent Labs  Lab 03/06/17 0045 03/07/17 0230  NA 138 140  K 4.2 3.7  CL  106 108  CO2 23 21*  GLUCOSE 124* 97  BUN 9 8  CREATININE 1.12 0.93  CALCIUM 8.7* 8.8*  PROT  --  6.5  ALBUMIN  --  3.0*  AST  --  37  ALT  --  53  ALKPHOS  --  58  BILITOT  --  0.6  GFRNONAA >60 >60  GFRAA >60 >60  ANIONGAP 9 11     Hematology Recent Labs  Lab 03/06/17 0045 03/07/17 0230  WBC 13.1* 8.1  RBC 4.15* 4.35  HGB 13.2 13.3  HCT 39.0 41.2  MCV 94.0 94.7  MCH 31.8 30.6  MCHC 33.8 32.3  RDW 13.5 13.7  PLT 228 233    Cardiac Enzymes Recent Labs  Lab 03/06/17 0304 03/06/17 0553 03/06/17 1317 03/06/17 1837  TROPONINI 0.03* <0.03 <0.03 <0.03   No results for input(s): TROPIPOC in the last 168 hours.   BNP Recent Labs  Lab 03/06/17 0045  BNP 11.2     DDimer  Recent Labs  Lab 03/06/17 0154  DDIMER 0.31     Radiology   Carotid Dopplers 03/06/17: 1-39% stenosis BL  Cardiac Studies   ECHO pending  EKG NSR rate 90 without ST segment elevations or depressions.  Patient Profile     62 y.o. 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 He was awoken from sleep with epigastric and upper abdominal pressure ("felt like someone was standing on me") which was associated with dizziness, SOB and mild diaphoresis. He stood up to go to the bathroom and felt that he was going to "black out" and came to the ED. He had been in his usual state of health although had returned from a 5 hr nonstop drive the evening prior to presentation. D-dimer negative at 0.3. Initial troponin 0.03 however remaining troponins <0.03 x3. EKG without ischemic changes. Carotid dopplers <39% stenosis BL and ECHO pending. CXR without active cardiopulmonary disease.  Assessment & Plan    Near-Syncope: No further episodes of weakness or near syncope and patient has been able to ambulate to and from the bathroom without issue. Denies prior  history of the same and is without chest pain or pressure since admission.  Given his history of long-distance driving, PE was high on the differential however now ruled out given the negative predictive value of a negative d-dimer. Carotid dopplers show no hemodynamically significant stenosis and TSH was normal. Troponin was minimally elevated initially however has resolved and EKG is without ischemic changes. Suspect event was vagally-mediated given its association with epigastric discomfort and negative work-up thus far.  -ECHO pending -Can likely have Myoview outpatient if echo normal as patient did not have substernal chest pain, rather epigastric pressure -Continue telemetry monitoring -ASA 81 mg daily   Pre-Diabetes: -Will add A1c for RF modification, can be followed by PCP  Mild Troponin elevation: At 0.03 on admission however <0.03 x3 since. Has reported history of [place] 2012 with normal coronary arteries.   HLD: Lipid panel obtained on admission with LDL 72, TG 194 and HDL 28. Atorvastatin 80mg  was started on admission, think this could be reduced to 40 mg daily. -Decrease to 40mg  Atorvastatin  HTN: On Lisinopril-HCTZ 20mg -12.5mg  at home. Coreg 3.125mg  BID started on admission however believe patient would benefit most from increasing his current home ACE-I and thiazide.  -Increase home medications to Lisinopril-HCTZ 40mg -25mg   Disposition: Likely dc today pending normal ECHO. Will need follow-up LDL and BMET in 4-6 weeks after discharge.   For questions or updates, please contact [place] Please consult [email or web address] for contact info under Cardiology/STEMI.   Signed, Nina Hoar, DO  03/07/2017, 11:37 AM

## 2017-12-18 ENCOUNTER — Emergency Department (HOSPITAL_BASED_OUTPATIENT_CLINIC_OR_DEPARTMENT_OTHER): Payer: BLUE CROSS/BLUE SHIELD

## 2017-12-18 ENCOUNTER — Encounter (HOSPITAL_COMMUNITY): Payer: Self-pay | Admitting: Emergency Medicine

## 2017-12-18 ENCOUNTER — Other Ambulatory Visit: Payer: Self-pay

## 2017-12-18 ENCOUNTER — Emergency Department (HOSPITAL_COMMUNITY)
Admission: EM | Admit: 2017-12-18 | Discharge: 2017-12-18 | Disposition: A | Discharge status: Home / Self Care | Payer: BLUE CROSS/BLUE SHIELD | Attending: Emergency Medicine | Admitting: Emergency Medicine

## 2017-12-18 DIAGNOSIS — M2550 Pain in unspecified joint: Secondary | ICD-10-CM

## 2017-12-18 DIAGNOSIS — Z79899 Other long term (current) drug therapy: Secondary | ICD-10-CM | POA: Insufficient documentation

## 2017-12-18 DIAGNOSIS — M791 Myalgia, unspecified site: Secondary | ICD-10-CM | POA: Diagnosis not present

## 2017-12-18 DIAGNOSIS — M25572 Pain in left ankle and joints of left foot: Secondary | ICD-10-CM | POA: Diagnosis not present

## 2017-12-18 DIAGNOSIS — R52 Pain, unspecified: Secondary | ICD-10-CM | POA: Diagnosis not present

## 2017-12-18 DIAGNOSIS — M79605 Pain in left leg: Secondary | ICD-10-CM | POA: Diagnosis not present

## 2017-12-18 DIAGNOSIS — Z87891 Personal history of nicotine dependence: Secondary | ICD-10-CM | POA: Insufficient documentation

## 2017-12-18 DIAGNOSIS — I10 Essential (primary) hypertension: Secondary | ICD-10-CM | POA: Diagnosis not present

## 2017-12-18 DIAGNOSIS — M7989 Other specified soft tissue disorders: Secondary | ICD-10-CM | POA: Diagnosis not present

## 2017-12-18 DIAGNOSIS — M25562 Pain in left knee: Secondary | ICD-10-CM | POA: Diagnosis not present

## 2017-12-18 DIAGNOSIS — Z7982 Long term (current) use of aspirin: Secondary | ICD-10-CM | POA: Diagnosis not present

## 2017-12-18 LAB — I-STAT TROPONIN, ED: Troponin i, poc: 0.01 ng/mL (ref 0.00–0.08)

## 2017-12-18 MED ORDER — PREDNISONE 10 MG PO TABS: 50.0000 mg | ORAL_TABLET | Freq: Every day | ORAL | 0 refills | Status: AC

## 2017-12-18 MED ORDER — HYDROCODONE-ACETAMINOPHEN 5-325 MG PO TABS
1.0000 | ORAL_TABLET | Freq: Once | ORAL | Status: AC
  Administered 2017-12-18: 1 via ORAL
  Filled 2017-12-18: qty 1

## 2017-12-18 MED ORDER — HYDROCODONE-ACETAMINOPHEN 5-325 MG PO TABS: 1.0000 | ORAL_TABLET | Freq: Four times a day (QID) | ORAL | 0 refills | Status: DC | PRN

## 2017-12-18 NOTE — ED Notes (Signed)
Ultrasound at bedside

## 2017-12-18 NOTE — ED Provider Notes (Signed)
[facility] EMERGENCY DEPARTMENT Provider Note   CSN: [id number] Arrival date & time: 12/18/17  1419     History   Chief Complaint Chief Complaint  Patient presents with  . Leg Pain    HPI [name]. is a 62 y.o. male with history of dyslipidemia, gout, hypertension, presents for evaluation of acute onset, progressively worsening left lower extremity pain for approximately 1 week.  Notes pain began in the left ankle and has since progressed to his left knee and hip.  Currently endorses severe pain to the entirety of the left lower extremity, denies numbness or tingling.  Reports pain worsens with attempts to ambulate and he is unable to bear weight on the extremity at this time and has been using a cane to help ambulate.  Denies fevers or chills, no history of IVDU.  Has been taking prednisone without relief of his symptoms.  He is a Programmer, systems and recently drove from [facility] back to [place] over the course of 3 days.  Returned 2 days ago.  States that on Saturday morning when stepping out of his vehicle he began to feel lightheaded with some central chest pain and shortness of breath.  He states that this lasted briefly before resolving and has not returned since.  Of note, patient has a remote history of a single elevated troponin in February of this year with a negative cardiac work-up on admission.   The history is provided by the patient.    Past Medical History:  Diagnosis Date  . Dyslipidemia   . History of gout   . Hypertension   . Normal coronary arteries 12/2007    Patient Active Problem List   Diagnosis Date Noted  . Near syncope 03/06/2017  . Elevated troponin 03/06/2017  . Hyperglycemia 03/06/2017  . Hypertension   . Dyslipidemia   . History of gout   . Normal coronary arteries 12/26/2007    Past Surgical History:  Procedure Laterality Date  . CARDIAC CATHETERIZATION  12/2007   normal coronaries         Home Medications    Prior to Admission medications   Medication Sig Start Date End Date Taking? Authorizing Provider  aspirin EC 81 MG EC tablet Take 1 tablet (81 mg total) by mouth daily. 03/08/17   Kayleen Memos, DO  atorvastatin (LIPITOR) 40 MG tablet Take 1 tablet (40 mg total) by mouth daily at 6 PM. 03/07/17   Kayleen Memos, DO  diclofenac sodium (VOLTAREN) 1 % GEL Apply 2 g topically 4 (four) times daily. Patient not taking: Reported on 03/06/2017 09/29/16   Ok Edwards, PA-C  HYDROcodone-acetaminophen (NORCO/VICODIN) 5-325 MG tablet Take 1 tablet by mouth every 6 (six) hours as needed for severe pain. 12/18/17   Kemper Hochman A, PA-C  lisinopril-hydrochlorothiazide (PRINZIDE,ZESTORETIC) 20-12.5 MG per tablet Take 1 tablet by mouth Daily. 09/30/11   [provider]  meloxicam (MOBIC) 15 MG tablet Take 15 mg by mouth daily.    [provider]  Multiple Vitamins-Minerals (MULTIVITAMIN WITH MINERALS) tablet Take 1 tablet by mouth daily.    [provider]  ondansetron (ZOFRAN) 4 MG tablet Take 1 tablet (4 mg total) by mouth every 6 (six) hours. Patient not taking: Reported on 03/06/2017 07/21/16   Montine Circle, PA-C  predniSONE (DELTASONE) 10 MG tablet Take 5 tablets (50 mg total) by mouth daily with breakfast for 5 days. 12/18/17 12/23/17  Renita Papa, PA-C  Family History Family History  Problem Relation Age of Onset  . Colon cancer Maternal Uncle   . Colon cancer Cousin   . Dementia Mother   . Alcohol abuse Father     Social History Social History   Tobacco Use  . Smoking status: Former Smoker    Types: Cigarettes  . Smokeless tobacco: Never Used  Substance Use Topics  . Alcohol use: No  . Drug use: No     Allergies   Patient has no known allergies.   Review of Systems Review of Systems  Constitutional: Negative for chills and fever.  Respiratory: Positive for shortness of breath (resolved).   Cardiovascular: Positive for  chest pain (resolved).  Musculoskeletal: Positive for arthralgias and myalgias.  Neurological: Positive for light-headedness (resolved).  All other systems reviewed and are negative.    Physical Exam Updated Vital Signs BP (!) 148/96   Pulse 77   Temp 98.4 F (36.9 C) (Oral)   Resp 15   Ht 6' (1.829 m)   Wt 104.3 kg   SpO2 97%   BMI 31.19 kg/m   Physical Exam  Constitutional: He appears well-developed and well-nourished. No distress.  Appears uncomfortable  HENT:  Head: Normocephalic and atraumatic.  Eyes: Conjunctivae are normal. Right eye exhibits no discharge. Left eye exhibits no discharge.  Neck: Normal range of motion. Neck supple. No JVD present. No tracheal deviation present.  Cardiovascular: Normal rate, regular rhythm, normal heart sounds and intact distal pulses.  2+ radial and DP/PT pulses bilaterally, Homans sign present on the left, no lower extremity edema, no palpable cords, compartments are soft   Pulmonary/Chest: Effort normal and breath sounds normal. No stridor. No respiratory distress. He has no wheezes. He has no rales.  Abdominal: Soft. Bowel sounds are normal. He exhibits no distension. There is no tenderness. There is no guarding.  Musculoskeletal: He exhibits no edema.  5/5 strength of BLE major muscle groups despite pain.  Patient with slightly decreased range of motion of the left knee with flexion due to pain but normal passive range of motion.  No varus valgus instability or ligamentous laxity noted.  Negative anterior/posterior drawer test.  The left ankle and knee are mildly warm to touch as compared to the right, no erythema or swelling noted.  Neurological: He is alert.  Fluent speech with no evidence of dysarthria or aphasia, no facial droop, sensation intact to soft touch of bilateral lower extremities.  Skin: Skin is warm and dry. No erythema.  Psychiatric: He has a normal mood and affect. His behavior is normal.  Nursing note and vitals  reviewed.    ED Treatments / Results  Labs (all labs ordered are listed, but only abnormal results are displayed) Labs Reviewed  I-STAT TROPONIN, ED    EKG ED ECG REPORT   Date: 12/18/2017  Rate: 83  Rhythm: normal sinus rhythm  QRS Axis: normal  Intervals: normal  ST/T Wave abnormalities: nonspecific T wave changes  Conduction Disutrbances:none  Narrative Interpretation:   Old EKG Reviewed: unchanged  I have personally reviewed the EKG tracing and agree with the computerized printout as noted.   Radiology Vas [place] Lower Extremity Venous (dvt) (mc And Wl 7a-7p)  Result Date: 12/18/2017  Lower Venous Study Indications: Pain, and Swelling.  Performing Technologist: Abram Sander RVS  Examination Guidelines: A complete evaluation includes B-mode imaging, spectral Doppler, color Doppler, and power Doppler as needed of all accessible portions of each vessel. Bilateral testing is considered an integral part of  a complete examination. Limited examinations for reoccurring indications may be performed as noted.  Right Venous Findings: +---+---------------+---------+-----------+----------+-------+    CompressibilityPhasicitySpontaneityPropertiesSummary +---+---------------+---------+-----------+----------+-------+ CFVFull           Yes      Yes                          +---+---------------+---------+-----------+----------+-------+  Left Venous Findings: +---------+---------------+---------+-----------+----------+-------+          CompressibilityPhasicitySpontaneityPropertiesSummary +---------+---------------+---------+-----------+----------+-------+ CFV      Full           Yes      Yes                          +---------+---------------+---------+-----------+----------+-------+ SFJ      Full                                                 +---------+---------------+---------+-----------+----------+-------+ FV Prox  Full                                                  +---------+---------------+---------+-----------+----------+-------+ FV Mid   Full                                                 +---------+---------------+---------+-----------+----------+-------+ FV DistalFull                                                 +---------+---------------+---------+-----------+----------+-------+ PFV      Full                                                 +---------+---------------+---------+-----------+----------+-------+ POP      Full           Yes      Yes                          +---------+---------------+---------+-----------+----------+-------+ PTV      Full                                                 +---------+---------------+---------+-----------+----------+-------+ PERO     Full                                                 +---------+---------------+---------+-----------+----------+-------+    Summary: Right: No evidence of common femoral vein obstruction. Left: There is no evidence of deep vein thrombosis in the lower extremity. No cystic structure found in the popliteal fossa.  *See  table(s) above for measurements and observations. Electronically signed by Harold Barban MD on 12/18/2017 at 5:33:46 PM.    Final     Procedures Procedures (including critical care time)  Medications Ordered in ED Medications  HYDROcodone-acetaminophen (NORCO/VICODIN) 5-325 MG per tablet 1 tablet (1 tablet Oral Given 12/18/17 1447)     Initial Impression / Assessment and Plan / ED Course  I have reviewed the triage vital signs and the nursing notes.  Pertinent labs & imaging results that were available during my care of the patient were reviewed by me and considered in my medical decision making (see chart for details).     Patient presenting for evaluation of acute onset, progressively worsening left lower extremity pain primarily localized to the left ankle and knee.  No recent injury.  Has a history of gout and  reports this feels similar but is more severe.  There is some warmth to the joints but full passive range of motion.  He is low risk for septic joint and I have low suspicion of this given full range of motion and no constitutional symptoms.  He did have a remote episode of shortness of breath and chest pain 3 days ago but troponin is negative and EKG shows no acute changes.  Doubt ACS/MI.  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  DVT study was performed with no evidence of acute DVT.  Suspect gout versus osteoarthritis.  No evidence of secondary skin infection.  He had some improvement with hydrocodone and is ambulatory with the assistance of a cane.  Exhibits good balance.  Stable for discharge home with follow-up with PCP for reevaluation of symptoms.  Discussed possibility of initiating allopurinol per PCP recommendations.  Will discharge with small amount of hydrocodone for severe breakthrough pain, [place] controlled substance registry was queried with no inconsistencies. Discussed strict ED return precautions. Pt verbalized understanding of and agreement with plan and is safe for discharge home at this time.   Final Clinical Impressions(s) / ED Diagnoses   Final diagnoses:  Multiple joint pain    ED Discharge Orders         Ordered    HYDROcodone-acetaminophen (NORCO/VICODIN) 5-325 MG tablet  Every 6 hours PRN     12/18/17 1546    predniSONE (DELTASONE) 10 MG tablet  Daily with breakfast     12/18/17 1546           Renita Papa, PA-C 12/19/17 2204    Julianne Rice, MD 12/20/17 2259

## 2017-12-18 NOTE — Discharge Instructions (Signed)
Continue taking prednisone as prescribed.  Do not take ibuprofen, Aleve, or Motrin while taking this medicine.  You can take 500-1,000 mg of Tylenol every 6 hours as needed for pain.  He can take hydrocodone as needed for severe pain but do not drive, drink alcohol, operate heavy machinery, or make important decisions while taking this medicine.  It may make you drowsy.  Be aware this medicine also contains Tylenol.  Apply ice or heat, whichever feels best 20 minutes at a time 2-3 times daily.  Do some gentle stretching to avoid muscle stiffness.  Follow-up with your primary care physician for reevaluation of your joint pains.  He can start you on a medicine called allopurinol if he feels it is indicated to help reduce the frequency of gout flares.  Additionally, you should avoid red meats, shellfish, or beer.  Return to the emergency department if any concerning signs or symptoms develop such as fevers, worsening pain, loss of pulses, color changes or redness of the joints.

## 2017-12-18 NOTE — ED Triage Notes (Addendum)
Patient reports left ankle pain onset of a week ago that has not progressed up the leg and into the knee. Denies any fevers. Recent long trip. Currently taking prednisone. Pain worse with weight on it. Currently using a cane.

## 2017-12-18 NOTE — Progress Notes (Signed)
*  Preliminary Results* Left lower extremity venous duplex completed. Left lower extremity is negative for deep vein thrombosis. There is no evidence of left Baker's cyst. Results given to RN.   12/18/2017 3:08 PM  [name] Dawna Part

## 2017-12-18 NOTE — ED Notes (Signed)
Patient Alert and oriented to baseline. Stable and ambulatory to baseline. Patient verbalized understanding of the discharge instructions.  Patient belongings were taken by the patient.   

## 2017-12-20 DIAGNOSIS — M25562 Pain in left knee: Secondary | ICD-10-CM | POA: Diagnosis not present

## 2017-12-20 DIAGNOSIS — Z6833 Body mass index (BMI) 33.0-33.9, adult: Secondary | ICD-10-CM | POA: Diagnosis not present

## 2017-12-20 DIAGNOSIS — R600 Localized edema: Secondary | ICD-10-CM | POA: Diagnosis not present

## 2017-12-20 DIAGNOSIS — I10 Essential (primary) hypertension: Secondary | ICD-10-CM | POA: Diagnosis not present

## 2018-01-29 ENCOUNTER — Emergency Department (HOSPITAL_COMMUNITY): Payer: BLUE CROSS/BLUE SHIELD

## 2018-01-29 ENCOUNTER — Encounter (HOSPITAL_COMMUNITY): Payer: Self-pay | Admitting: *Deleted

## 2018-01-29 ENCOUNTER — Emergency Department (HOSPITAL_COMMUNITY)
Admission: EM | Admit: 2018-01-29 | Discharge: 2018-01-29 | Disposition: A | Discharge status: Home / Self Care | Payer: BLUE CROSS/BLUE SHIELD | Attending: Emergency Medicine | Admitting: Emergency Medicine

## 2018-01-29 DIAGNOSIS — R079 Chest pain, unspecified: Secondary | ICD-10-CM | POA: Diagnosis not present

## 2018-01-29 DIAGNOSIS — I10 Essential (primary) hypertension: Secondary | ICD-10-CM | POA: Insufficient documentation

## 2018-01-29 DIAGNOSIS — M542 Cervicalgia: Secondary | ICD-10-CM | POA: Diagnosis not present

## 2018-01-29 DIAGNOSIS — S299XXA Unspecified injury of thorax, initial encounter: Secondary | ICD-10-CM | POA: Diagnosis not present

## 2018-01-29 DIAGNOSIS — S3992XA Unspecified injury of lower back, initial encounter: Secondary | ICD-10-CM | POA: Diagnosis not present

## 2018-01-29 DIAGNOSIS — T1490XA Injury, unspecified, initial encounter: Secondary | ICD-10-CM

## 2018-01-29 DIAGNOSIS — M545 Low back pain, unspecified: Secondary | ICD-10-CM

## 2018-01-29 DIAGNOSIS — R918 Other nonspecific abnormal finding of lung field: Secondary | ICD-10-CM | POA: Diagnosis not present

## 2018-01-29 DIAGNOSIS — R55 Syncope and collapse: Secondary | ICD-10-CM | POA: Diagnosis not present

## 2018-01-29 DIAGNOSIS — F17210 Nicotine dependence, cigarettes, uncomplicated: Secondary | ICD-10-CM | POA: Insufficient documentation

## 2018-01-29 DIAGNOSIS — R51 Headache: Secondary | ICD-10-CM | POA: Diagnosis not present

## 2018-01-29 DIAGNOSIS — Z79899 Other long term (current) drug therapy: Secondary | ICD-10-CM | POA: Diagnosis not present

## 2018-01-29 DIAGNOSIS — R531 Weakness: Secondary | ICD-10-CM | POA: Diagnosis not present

## 2018-01-29 DIAGNOSIS — R0789 Other chest pain: Secondary | ICD-10-CM | POA: Insufficient documentation

## 2018-01-29 DIAGNOSIS — R42 Dizziness and giddiness: Secondary | ICD-10-CM | POA: Diagnosis not present

## 2018-01-29 DIAGNOSIS — E279 Disorder of adrenal gland, unspecified: Secondary | ICD-10-CM | POA: Diagnosis not present

## 2018-01-29 LAB — I-STAT TROPONIN, ED
Troponin i, poc: 0 ng/mL (ref 0.00–0.08)
Troponin i, poc: 0.01 ng/mL (ref 0.00–0.08)

## 2018-01-29 LAB — URINALYSIS, ROUTINE W REFLEX MICROSCOPIC
Bilirubin Urine: NEGATIVE
Glucose, UA: NEGATIVE mg/dL
Hgb urine dipstick: NEGATIVE
Ketones, ur: NEGATIVE mg/dL
Leukocytes, UA: NEGATIVE
Nitrite: NEGATIVE
Protein, ur: NEGATIVE mg/dL
Specific Gravity, Urine: 1.02 (ref 1.005–1.030)
pH: 8 (ref 5.0–8.0)

## 2018-01-29 LAB — HEPATIC FUNCTION PANEL
ALK PHOS: 72 U/L (ref 38–126)
ALT: 47 U/L — ABNORMAL HIGH (ref 0–44)
AST: 39 U/L (ref 15–41)
Albumin: 3.5 g/dL (ref 3.5–5.0)
Bilirubin, Direct: 0.2 mg/dL (ref 0.0–0.2)
Indirect Bilirubin: 0.8 mg/dL (ref 0.3–0.9)
TOTAL PROTEIN: 7.8 g/dL (ref 6.5–8.1)
Total Bilirubin: 1 mg/dL (ref 0.3–1.2)

## 2018-01-29 LAB — BASIC METABOLIC PANEL
ANION GAP: 12 (ref 5–15)
BUN: 10 mg/dL (ref 8–23)
CO2: 23 mmol/L (ref 22–32)
Calcium: 9 mg/dL (ref 8.9–10.3)
Chloride: 104 mmol/L (ref 98–111)
Creatinine, Ser: 1.41 mg/dL — ABNORMAL HIGH (ref 0.61–1.24)
GFR calc non Af Amer: 53 mL/min — ABNORMAL LOW (ref >60)
GLUCOSE: 153 mg/dL — AB (ref 70–99)
POTASSIUM: 3.7 mmol/L (ref 3.5–5.1)
Sodium: 139 mmol/L (ref 135–145)

## 2018-01-29 LAB — CBC
HEMATOCRIT: 44.6 % (ref 39.0–52.0)
Hemoglobin: 14.6 g/dL (ref 13.0–17.0)
MCH: 31.5 pg (ref 26.0–34.0)
MCHC: 32.7 g/dL (ref 30.0–36.0)
MCV: 96.1 fL (ref 80.0–100.0)
NRBC: 0 % (ref 0.0–0.2)
Platelets: 266 10*3/uL (ref 150–400)
RBC: 4.64 MIL/uL (ref 4.22–5.81)
RDW: 13.5 % (ref 11.5–15.5)
WBC: 14.9 10*3/uL — ABNORMAL HIGH (ref 4.0–10.5)

## 2018-01-29 LAB — I-STAT CHEM 8, ED
BUN: 12 mg/dL (ref 8–23)
Calcium, Ion: 1.15 mmol/L (ref 1.15–1.40)
Chloride: 105 mmol/L (ref 98–111)
Creatinine, Ser: 1.3 mg/dL — ABNORMAL HIGH (ref 0.61–1.24)
Glucose, Bld: 151 mg/dL — ABNORMAL HIGH (ref 70–99)
HCT: 46 % (ref 39.0–52.0)
Hemoglobin: 15.6 g/dL (ref 13.0–17.0)
POTASSIUM: 3.7 mmol/L (ref 3.5–5.1)
Sodium: 140 mmol/L (ref 135–145)
TCO2: 25 mmol/L (ref 22–32)

## 2018-01-29 LAB — RAPID URINE DRUG SCREEN, HOSP PERFORMED
Amphetamines: NOT DETECTED
Barbiturates: NOT DETECTED
Benzodiazepines: NOT DETECTED
Cocaine: NOT DETECTED
Opiates: NOT DETECTED
Tetrahydrocannabinol: NOT DETECTED

## 2018-01-29 LAB — LIPASE, BLOOD: Lipase: 20 U/L (ref 11–51)

## 2018-01-29 LAB — ETHANOL: Alcohol, Ethyl (B): <10 mg/dL (ref <10)

## 2018-01-29 LAB — CBG MONITORING, ED: Glucose-Capillary: 153 mg/dL — ABNORMAL HIGH (ref 70–99)

## 2018-01-29 IMAGING — CT CT ANGIO CHEST-ABD-PELV FOR DISSECTION W/ AND WO/W CM
2 of 7 series · 13 of 46 positions shown, 15 images · IV contrast (iopamidol)
Comparison: None.

CLINICAL DATA: CT chest [DATE].

EXAM:
CT ANGIOGRAPHY CHEST, ABDOMEN AND PELVIS
TECHNIQUE: Multidetector CT imaging through the chest, abdomen and pelvis was
performed using the standard protocol during bolus administration of
intravenous contrast. Multiplanar reconstructed images and MIPs were
obtained and reviewed to evaluate the vascular anatomy.
CONTRAST:  50mL [D3] IOPAMIDOL ([D3]) INJECTION 76%,
80mL [D3] IOPAMIDOL ([D3]) INJECTION 76%

[Series 6: arterial · axial · arterial · 0.88mm/px · z∈[-874,-272]mm · 10 of 345 slices shown, 12 images]
[im 22/345  soft-tissue]
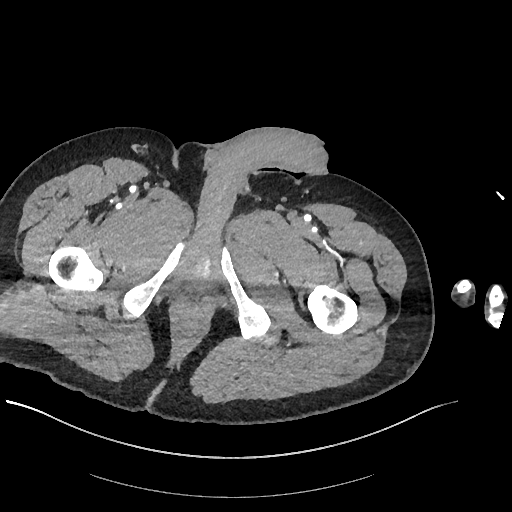
[im 22/345  bone]
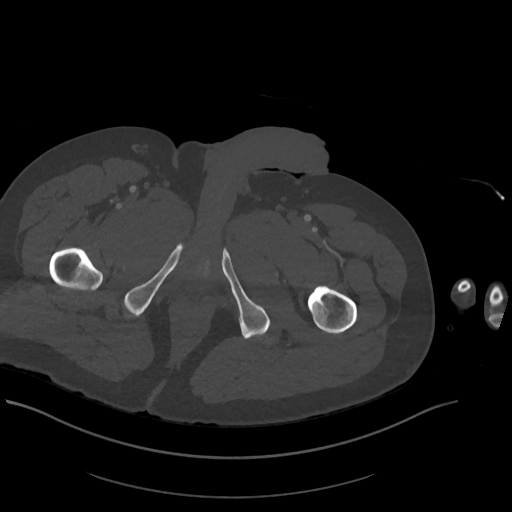
[im 65/345  soft-tissue]
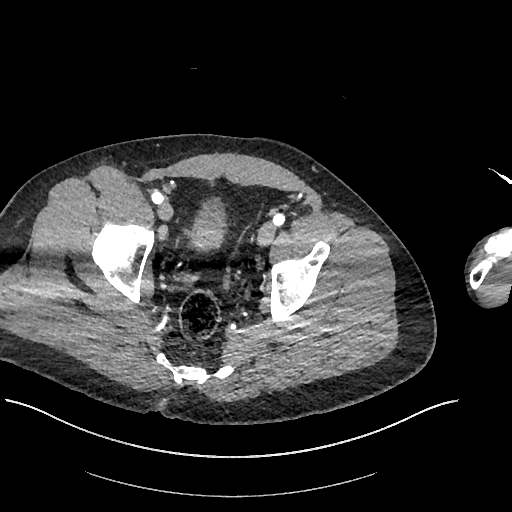
[im 87/345  soft-tissue]
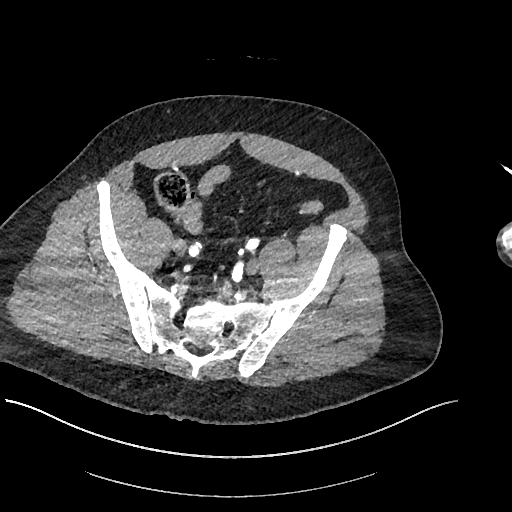
[im 130/345  soft-tissue]
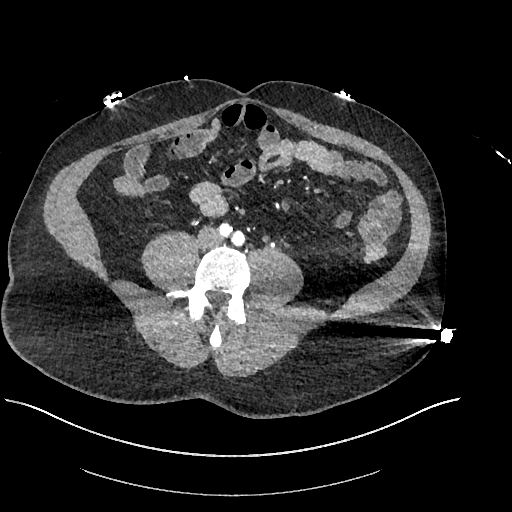
[im 151/345  soft-tissue]
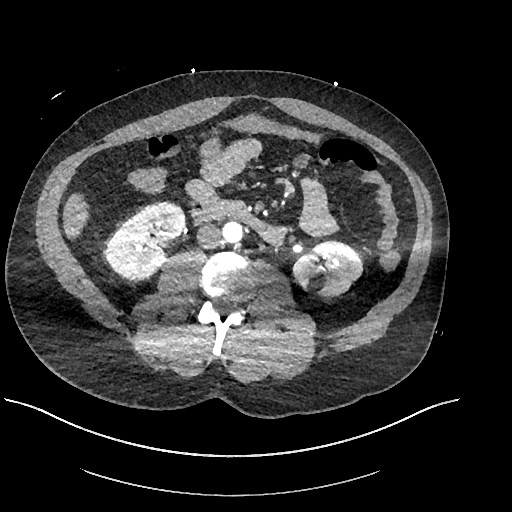
[im 194/345  soft-tissue]
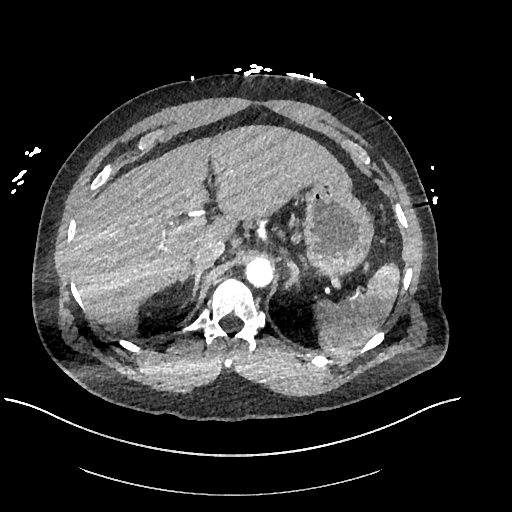
[im 216/345  soft-tissue]
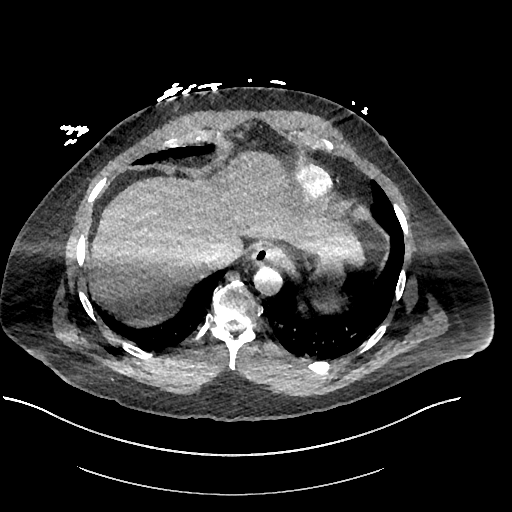
[im 259/345  soft-tissue]
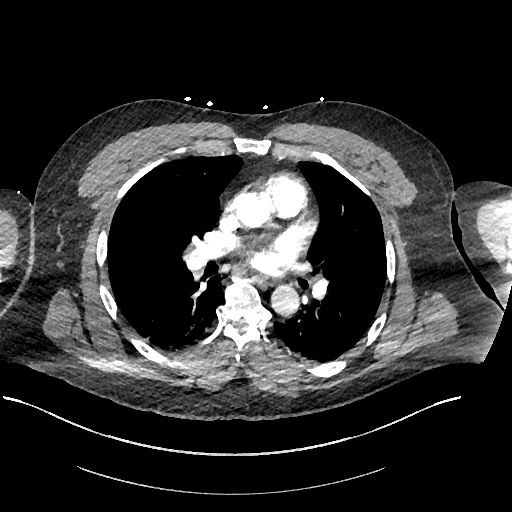
[im 280/345  soft-tissue]
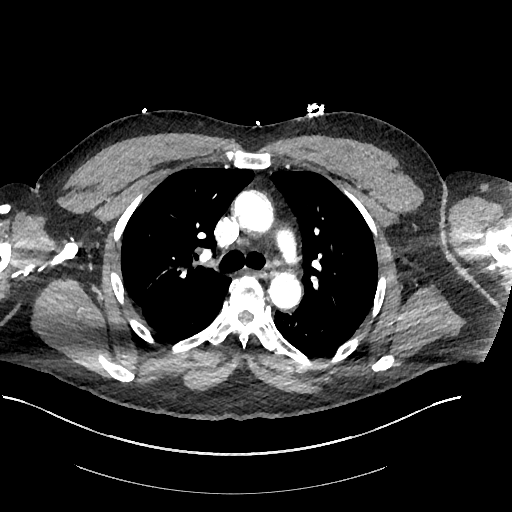
[im 280/345  bone]
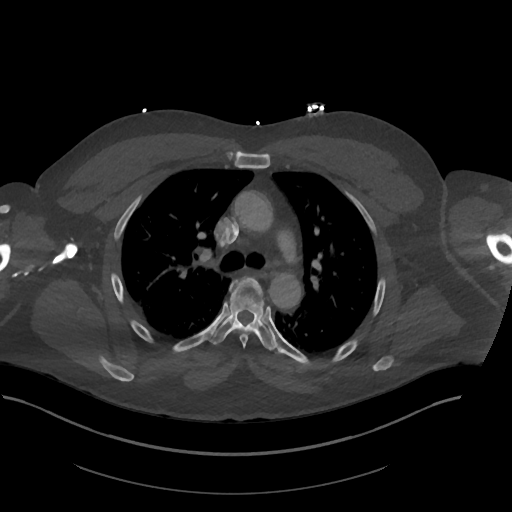
[im 323/345  soft-tissue]
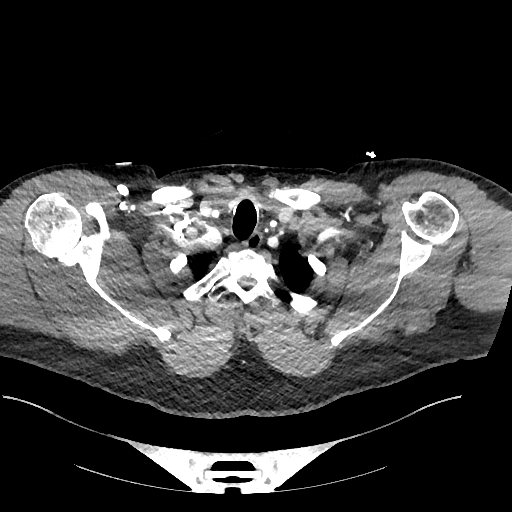

[Series 9: cor · coronal · 0.93mm/px · 3 of 140 slices shown]
[im 35/140  soft-tissue]
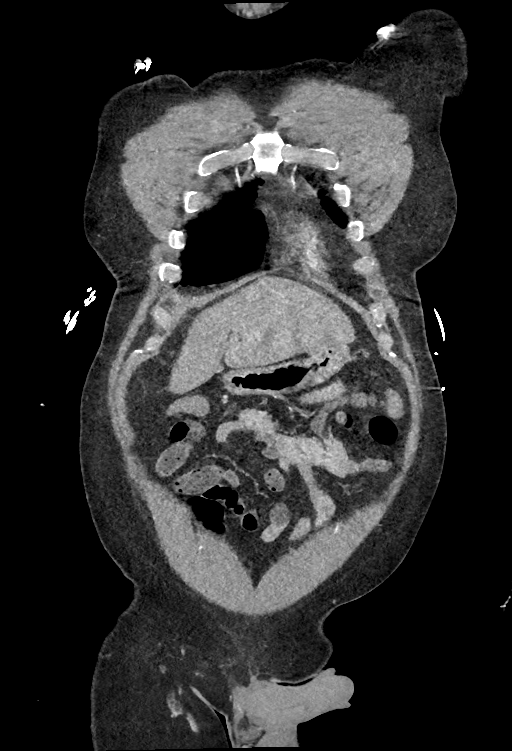
[im 70/140  soft-tissue]
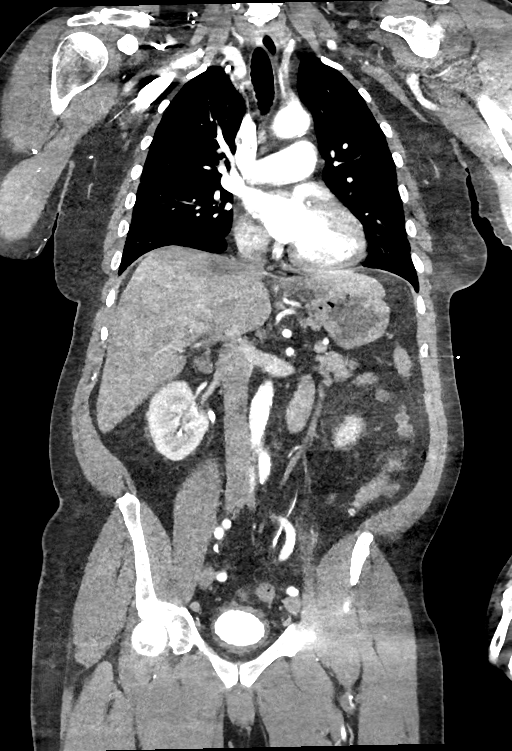
[im 105/140  soft-tissue]
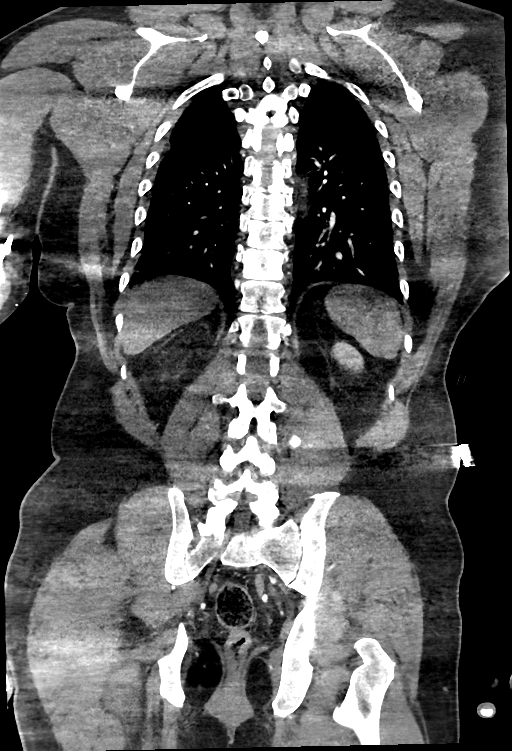

[13 of 46 positions shown; findings below may reference images not displayed]

FINDINGS: CTA CHEST FINDINGS

Cardiovascular: Preferential opacification of the thoracic aorta. No
evidence of thoracic aortic aneurysm or dissection. Normal heart
size. No pericardial effusion. There is mild aortic atherosclerosis.
The main pulmonary artery appears patent without saddle embolus.

Mediastinum/Nodes: No enlarged mediastinal, hilar, or axillary lymph
nodes. Thyroid gland, trachea, and esophagus demonstrate no
significant findings.

Lungs/Pleura: No pleural effusion. Mild changes of centrilobular
emphysema with diffuse bronchial wall thickening. No pneumothorax,
airspace consolidation or atelectasis. Calcified granuloma
identified within the right upper lobe small noncalcified right
upper lobe lung nodule measures 4 mm, unchanged from previous exam.
This is compatible with a benign abnormality. Small nodule in the
left upper lobe measures 4 mm and is new from previous study, image
41/7.

Musculoskeletal: Thoracic spondylosis.  No acute abnormality.

Review of the MIP images confirms the above findings.

CTA ABDOMEN AND PELVIS FINDINGS

VASCULAR

Aorta: Normal caliber aorta without aneurysm, dissection, vasculitis
or significant stenosis.

Celiac: Patent without evidence of aneurysm, dissection, vasculitis
or significant stenosis.

SMA: Patent without evidence of aneurysm, dissection, vasculitis or
significant stenosis.

Renals: Both renal arteries are patent without evidence of aneurysm,
dissection, vasculitis, fibromuscular dysplasia or significant
stenosis.

IMA: Patent without evidence of aneurysm, dissection, vasculitis or
significant stenosis.

Inflow: Patent without evidence of aneurysm, dissection, vasculitis
or significant stenosis.

Veins: No obvious venous abnormality within the limitations of this
arterial phase study.

Review of the MIP images confirms the above findings.

NON-VASCULAR

Hepatobiliary: No focal liver abnormality is seen. No gallstones,
gallbladder wall thickening, or biliary dilatation.

Pancreas: Unremarkable. No pancreatic ductal dilatation or
surrounding inflammatory changes.

Spleen: Normal in size without focal abnormality.

Adrenals/Urinary Tract: Left adrenal nodule is indeterminate
measuring 1.1 by 1.2 cm, image 162/6. Bilateral kidney cysts
identified. No hydronephrosis or hydroureter identified bilaterally.
Thick walled urinary bladder noted.

Stomach/Bowel: Stomach is within normal limits. Appendix appears
normal. No evidence of bowel wall thickening, distention, or
inflammatory changes.

Lymphatic: No significant vascular findings are present. No enlarged
abdominal or pelvic lymph nodes.

Reproductive: Prostate gland enlargement.

Other: No abdominal wall hernia or abnormality. No abdominopelvic
ascites.

Musculoskeletal: No acute or significant osseous findings.

Review of the MIP images confirms the above findings.
IMPRESSION: 1. No acute cardiopulmonary abnormalities. No evidence for thoracic
or abdominal aortic aneurysm or dissection.
2. Mild aortic atherosclerosis.
3. Left upper lobe lung nodule measuring 4 mm is new from previous
exam. No follow-up needed if patient is low-risk. Non-contrast chest
CT can be considered in 12 months if patient is high-risk. This
recommendation follows the consensus statement: Guidelines for
Management of Incidental Pulmonary Nodules Detected on CT Images:
4. Indeterminate left adrenal nodule measuring 1.2 cm. Recommend
follow-up imaging with nonemergent adrenal protocol MRI.
5. Diffuse bronchial wall thickening with emphysema, as above;
imaging findings suggestive of underlying COPD.
6. Aortic Atherosclerosis ([D3]-[D3]) and Emphysema ([D3]-[D3]).

## 2018-01-29 IMAGING — CT CT T SPINE W/O CM
3 of 12 series · 6 of 33 positions shown, 7 images · non-contrast
Comparison: None.

CLINICAL DATA: Chest pain over the last 2 weeks. Syncopal episode
with fall.

EXAM:
CT THORACIC AND LUMBAR SPINE WITHOUT CONTRAST
TECHNIQUE: Multidetector CT imaging of the thoracic and lumbar spine was
performed without contrast. Multiplanar CT image reconstructions
were also generated.

[Series 3: tspine st · axial · 0.37mm/px · z∈[-452,-346]mm · 2 of 160 slices shown, 3 images]
[im 54/160  soft-tissue]
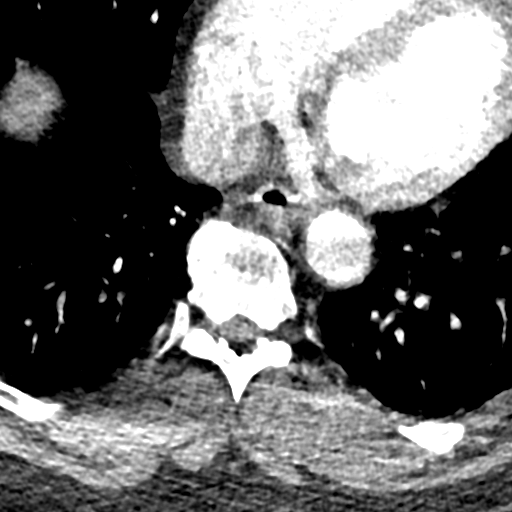
[im 54/160  bone]
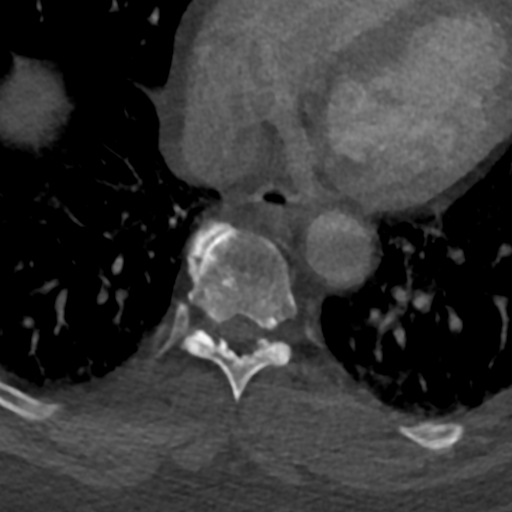
[im 107/160  bone]
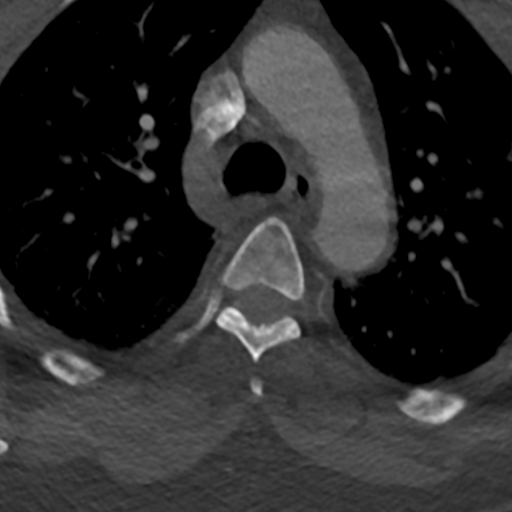

[Series 4: tspine bone · axial · 0.37mm/px · z∈[-452,-346]mm · 2 of 160 slices shown]
[im 54/160  bone]
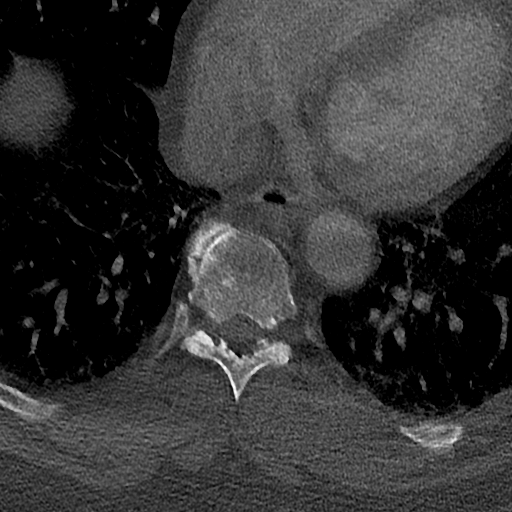
[im 107/160  bone]
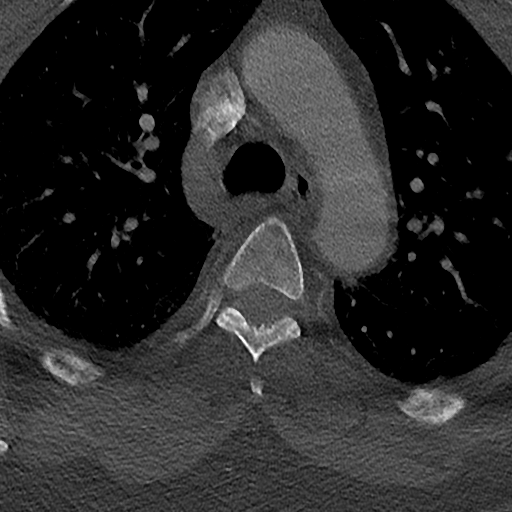

[Series 508: sag st · sagittal · 0.47mm/px · 2 of 72 slices shown]
[im 24/72  bone]
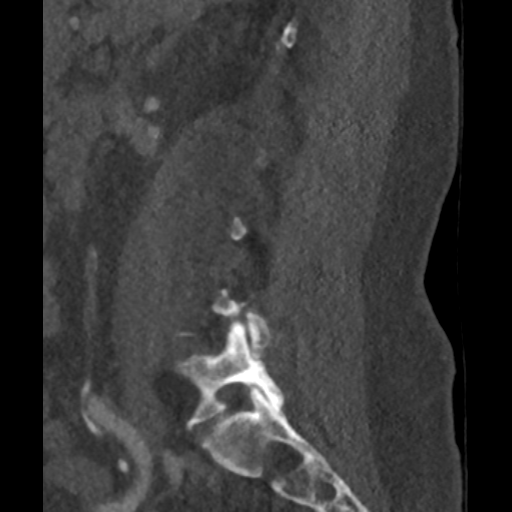
[im 48/72  bone]
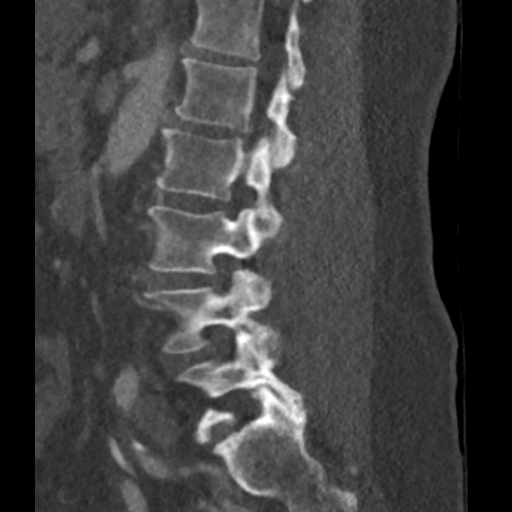

[6 of 33 positions shown; findings below may reference images not displayed]

FINDINGS: CT THORACIC SPINE FINDINGS

Alignment: Mild cervicothoracic curvature convex to the left. No
traumatic malalignment.

Vertebrae: No thoracic spine fracture or focal bone lesion.

Paraspinal and other soft tissues: Negative

Disc levels: Ordinary disc bulges, most notable on the left at T7-8
and T8-9. Ordinary midthoracic facet osteoarthritis. No apparently
significant stenosis of the canal or foramina. Posteromedial ribs
are negative.

CT LUMBAR SPINE FINDINGS

Segmentation: 5 lumbar type vertebral bodies.

Alignment: Normal

Vertebrae: No fracture or traumatic finding.

Paraspinal and other soft tissues: See results of abdominal CT.

Disc levels: Small endplate osteophytes and mild disc bulges without
evidence of advanced finding. Ordinary L4-5 and L5-S1 facet
osteoarthritis without encroachment upon the neural structures. No
significant canal or foraminal stenosis suspected. Mild bilateral
sacroiliac osteoarthritis.
IMPRESSION: No acute or traumatic finding. Ordinary mild degenerative changes.

No advanced finding. No suspicion significant stenosis of the canal
or foramina.

## 2018-01-29 IMAGING — DX DG CHEST 1V PORT
1 series · 1 of 1 positions shown · non-contrast
Comparison: Chest radiograph performed [DATE]

CLINICAL DATA: Subacute onset of bilateral chest pain. Status post
fall.

EXAM:
PORTABLE CHEST 1 VIEW

[chest ap]
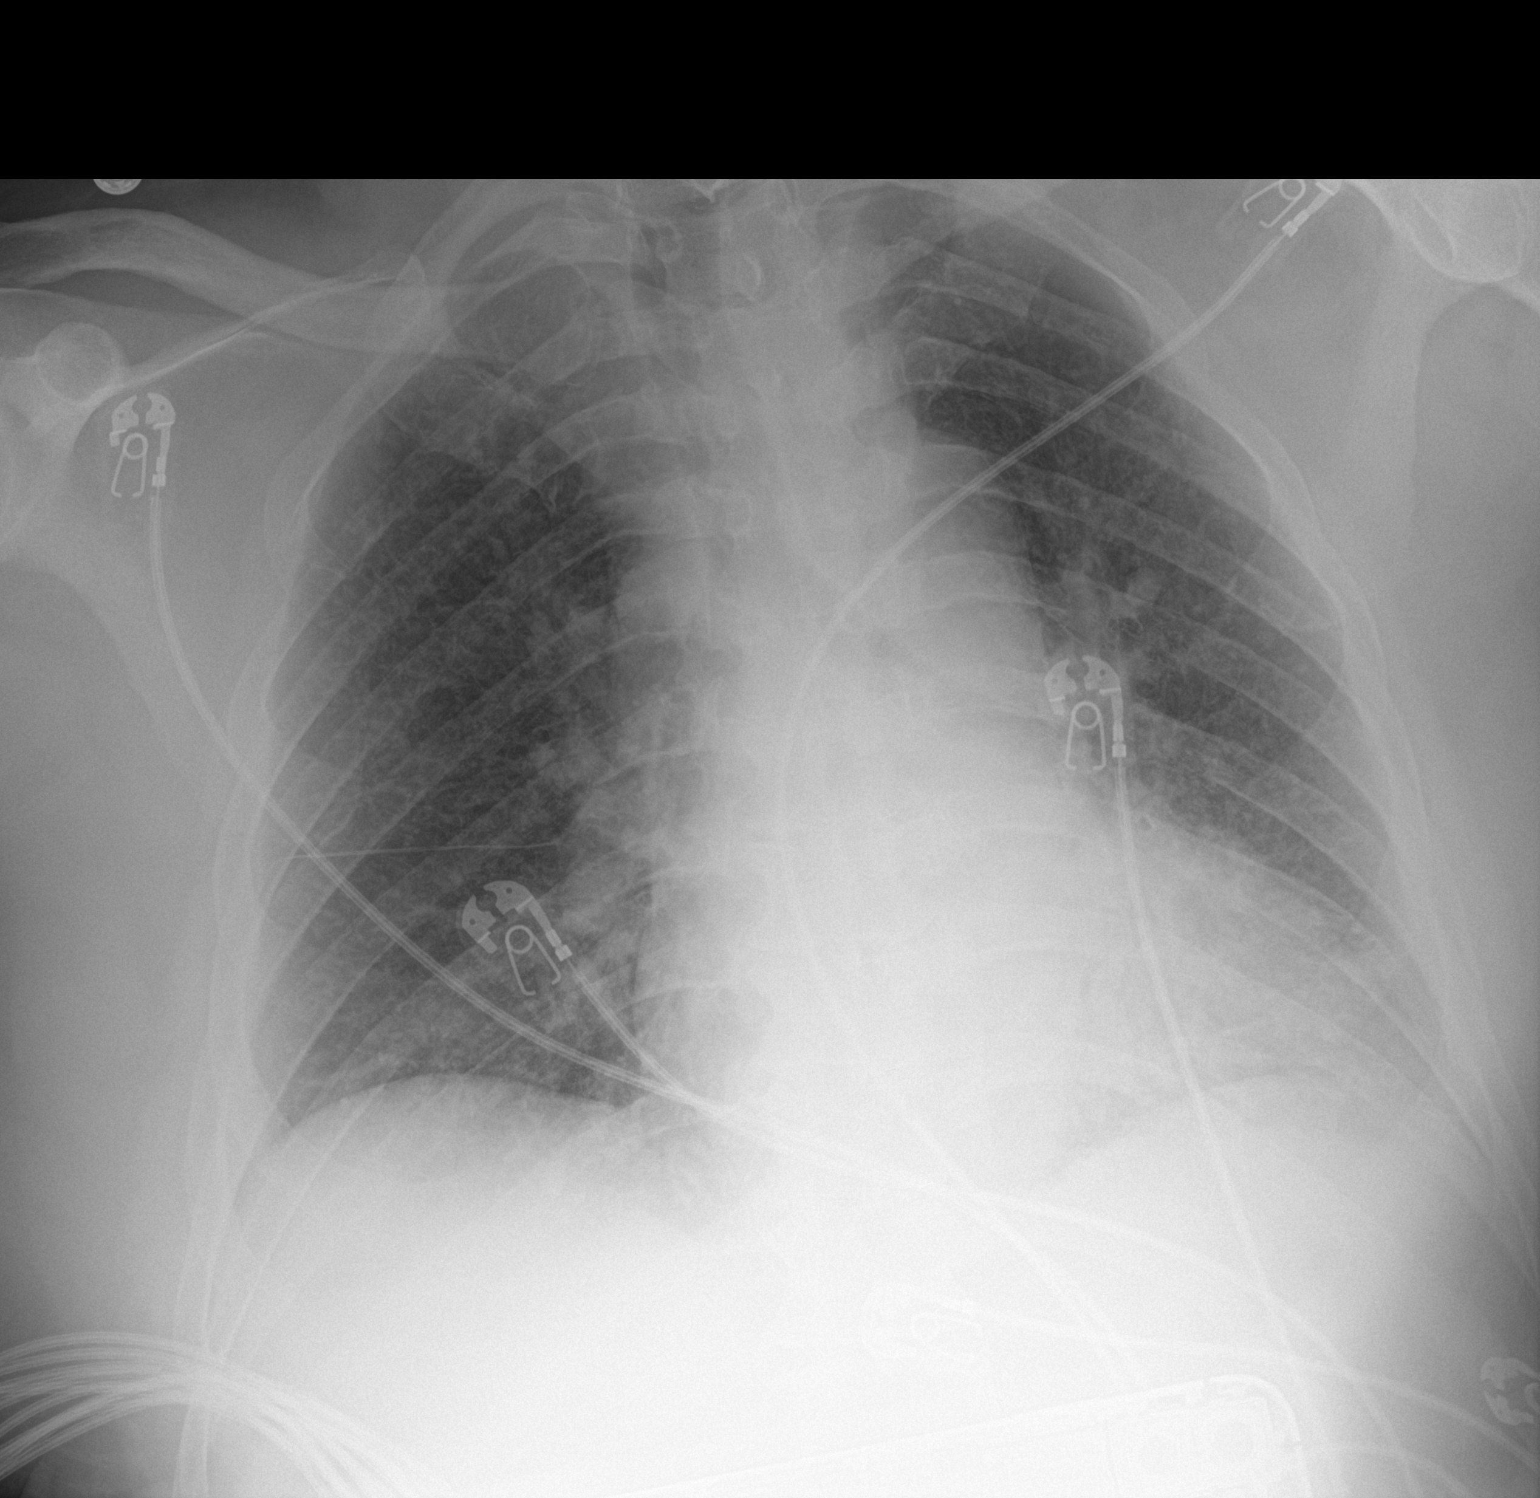

[1 of 1 positions shown; findings below may reference images not displayed]

FINDINGS: The lungs are well-aerated. Retrocardiac airspace opacity may
reflect pneumonia. There is no evidence of significant pleural
effusion or pneumothorax.

The cardiomediastinal silhouette is mildly enlarged. No acute
osseous abnormalities are seen.
IMPRESSION: Retrocardiac airspace opacity may reflect pneumonia. Mild
cardiomegaly. No displaced rib fracture seen.

## 2018-01-29 IMAGING — CT CT ANGIO NECK
1 of 12 series · 3 of 16 positions shown · IV contrast (APPLIED)
Comparison: None.

CLINICAL DATA: Syncopal episode with possible head trauma.
Intermittent dizziness.

EXAM:
CT ANGIOGRAPHY HEAD AND NECK
TECHNIQUE: Multidetector CT imaging of the head and neck was performed using
the standard protocol during bolus administration of intravenous
contrast. Multiplanar CT image reconstructions and MIPs were
obtained to evaluate the vascular anatomy. Carotid stenosis
measurements (when applicable) are obtained utilizing NASCET
criteria, using the distal internal carotid diameter as the
denominator.
CONTRAST:  50mL [2T] IOPAMIDOL ([2T]) INJECTION 76%,
80mL [2T] IOPAMIDOL ([2T]) INJECTION 76%

[Series 7: ax thins · axial · 0.51mm/px · z∈[-395,-9]mm · 3 of 389 slices shown]
[im 1/389  soft-tissue]
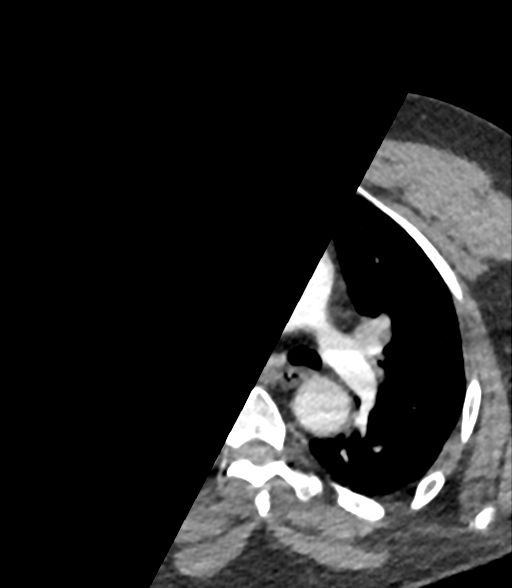
[im 195/389  bone]
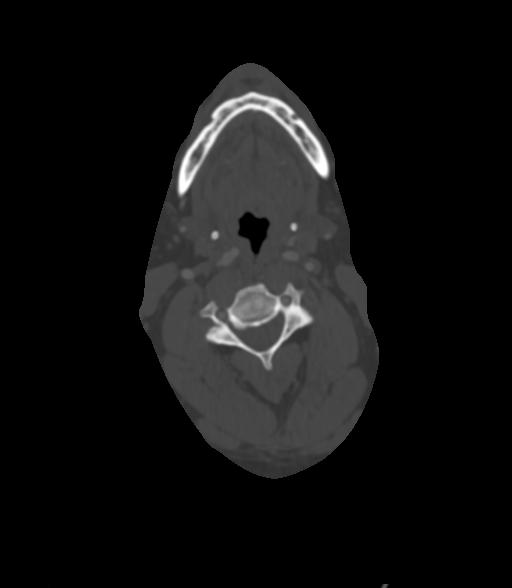
[im 389/389  soft-tissue]
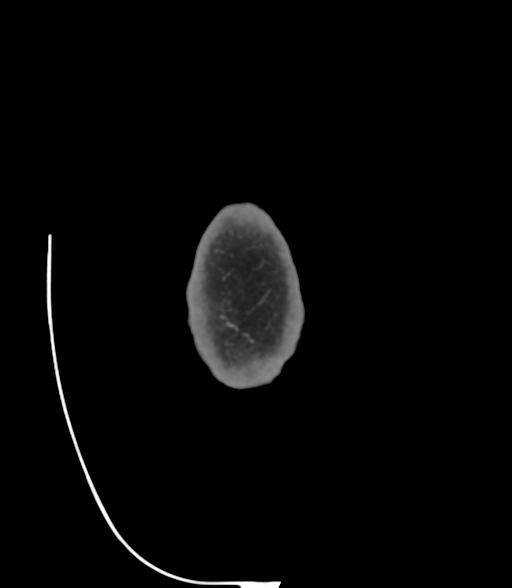

[3 of 16 positions shown; findings below may reference images not displayed]

FINDINGS: CT HEAD FINDINGS

Brain: No evidence of atrophy, old or acute infarction, mass lesion,
hemorrhage, hydrocephalus or extra-axial collection. Subependymal
calcification along the left lateral margin of the fourth ventricle
could represent a small insignificant calcified sub ependymoma.

Vascular: No abnormal vascular finding.

Skull: Negative

Sinuses: Clear

Orbits: Normal

Review of the MIP images confirms the above findings

CTA NECK FINDINGS

Aortic arch: Mild aortic atherosclerotic calcification. No aneurysm
or dissection.

Right carotid system: Common carotid artery widely patent to the
bifurcation. Minimal atherosclerotic plaque at the carotid
bifurcation but no stenosis. Cervical ICA widely patent.

Left carotid system: Common carotid artery widely patent to the
bifurcation. Mild atherosclerotic plaque at carotid bifurcation.
Minimal diameter of the proximal ICA is 4 mm. Compared to a more
distal cervical ICA diameter of 5 mm, this indicates a 20% stenosis.
Mild irregularity of the cervical ICA probably indicates mild
fibromuscular dysplasia. No evidence of stenosis or pseudo aneurysm.

Vertebral arteries: Right vertebral artery has a normal origin and
is widely patent through the cervical region. Left vertebral artery
originates from the aortic arch and is widely patent through the
cervical region to the foramen magnum.

Skeleton: Very ordinary cervical spondylosis.

Other neck: No mass or lymphadenopathy.

Upper chest: Calcified granuloma in the right upper lobe. Mild
pulmonary scarring.

Review of the MIP images confirms the above findings

CTA HEAD FINDINGS

Anterior circulation: Both internal carotid arteries are patent
through the skull base and siphon regions. No siphon stenosis. The
anterior and middle cerebral vessels are patent without proximal
stenosis, aneurysm or vascular malformation. Both anterior cerebral
is a receive there supply from the right carotid circulation.

Posterior circulation: Both vertebral arteries are widely patent to
the basilar. No basilar stenosis. Posterior circulation branch
vessels are patent. Mild atherosclerotic irregularity of the PCA
branches but without dominant stenosis.

Venous sinuses: Patent and normal.

Anatomic variants: None significant.

Delayed phase: No abnormal enhancement.

Review of the MIP images confirms the above findings
IMPRESSION: 1. No acute or traumatic finding. No cause of the presenting
symptoms is identified.
2. Mild atherosclerotic disease at both carotid bifurcations. 20%
stenosis of the proximal ICA on the left. Mild irregularity of the
cervical ICA on the left suggesting mild fibromuscular dysplasia. No
stenosis or pseudo aneurysm.
3. No intracranial large or medium vessel occlusion or correctable
proximal stenosis. Mild atherosclerotic irregularity of the PCA
branches.

## 2018-01-29 MED ORDER — IOPAMIDOL (ISOVUE-370) INJECTION 76%
INTRAVENOUS | Status: AC
  Administered 2018-01-29: 50 mL
  Filled 2018-01-29: qty 100

## 2018-01-29 MED ORDER — SODIUM CHLORIDE 0.9 % IV BOLUS
1000.0000 mL | Freq: Once | INTRAVENOUS | Status: AC
  Administered 2018-01-29: 1000 mL via INTRAVENOUS

## 2018-01-29 MED ORDER — IOPAMIDOL (ISOVUE-370) INJECTION 76%
INTRAVENOUS | Status: AC
  Administered 2018-01-29: 80 mL
  Filled 2018-01-29: qty 50

## 2018-01-29 MED ORDER — FENTANYL CITRATE (PF) 100 MCG/2ML IJ SOLN
50.0000 ug | Freq: Once | INTRAMUSCULAR | Status: AC
  Administered 2018-01-29: 50 ug via INTRAVENOUS
  Filled 2018-01-29: qty 2

## 2018-01-29 MED ORDER — METHOCARBAMOL 500 MG PO TABS: 500.0000 mg | ORAL_TABLET | Freq: Two times a day (BID) | ORAL | 0 refills | Status: DC

## 2018-01-29 MED ORDER — LIDOCAINE 5 % EX PTCH: 1.0000 | MEDICATED_PATCH | CUTANEOUS | 0 refills | Status: DC

## 2018-01-29 NOTE — ED Provider Notes (Signed)
[facility] Provider Note   CSN: [id number] Arrival date & time: 01/29/18  0534     History   Chief Complaint No chief complaint on file.   HPI [name]. is a 63 y.o. male.  HPI   [name]. is a 64 y.o. male, with a history of HTN and dyslipidemia, presenting to the ED with syncope that occurred this morning.  Patient states he stepped out of a hot shower and then passed out.  He was incontinent of stool.  He drove himself to the ED. He complains of moderate to severe pain in his chest, neck, and left side of the head that seems to have improved since the incident.  Currently complains of generalized weakness and generalized back pain. 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  His last food was several hours ago. For the last 2 weeks, he has been having intermittent pain in the left side of his chest.  Denies fever/chills, recent illness, nausea/vomiting, abdominal pain, numbness, focal weakness, vision loss, or any other abnormalities.  He denies any pain or abnormalities prior to the syncope.   Past Medical History:  Diagnosis Date  . Dyslipidemia   . History of gout   . Hypertension   . Normal coronary arteries 12/2007    Patient Active Problem List   Diagnosis Date Noted  . Near syncope 03/06/2017  . Elevated troponin 03/06/2017  . Hyperglycemia 03/06/2017  . Hypertension   . Dyslipidemia   . History of gout   . Normal coronary arteries 12/26/2007    Past Surgical History:  Procedure Laterality Date  . CARDIAC CATHETERIZATION  12/2007   normal coronaries        Home Medications    Prior to Admission medications   Medication Sig Start Date End Date Taking? Authorizing Provider  lisinopril-hydrochlorothiazide (PRINZIDE,ZESTORETIC) 20-12.5 MG per tablet Take 1 tablet by mouth Daily. 09/30/11  Yes [provider]  meloxicam (MOBIC) 15 MG tablet Take 15 mg by  mouth daily.   Yes [provider]  Multiple Vitamins-Minerals (MULTIVITAMIN WITH MINERALS) tablet Take 1 tablet by mouth daily.   Yes [provider]  aspirin EC 81 MG EC tablet Take 1 tablet (81 mg total) by mouth daily. Patient not taking: Reported on 01/29/2018 03/08/17   Kayleen Memos, DO  atorvastatin (LIPITOR) 40 MG tablet Take 1 tablet (40 mg total) by mouth daily at 6 PM. Patient not taking: Reported on 01/29/2018 03/07/17   Kayleen Memos, DO  diclofenac sodium (VOLTAREN) 1 % GEL Apply 2 g topically 4 (four) times daily. Patient not taking: Reported on 03/06/2017 09/29/16   Ok Edwards, PA-C  HYDROcodone-acetaminophen (NORCO/VICODIN) 5-325 MG tablet Take 1 tablet by mouth every 6 (six) hours as needed for severe pain. Patient not taking: Reported on 01/29/2018 12/18/17   Rodell Perna A, PA-C  lidocaine (LIDODERM) 5 % Place 1 patch onto the skin daily. Remove & Discard patch within 12 hours or as directed by MD 01/29/18   Dionne Knoop C, PA-C  methocarbamol (ROBAXIN) 500 MG tablet Take 1 tablet (500 mg total) by mouth 2 (two) times daily. 01/29/18   Sheilia Reznick C, PA-C  ondansetron (ZOFRAN) 4 MG tablet Take 1 tablet (4 mg total) by mouth every 6 (six) hours. Patient not taking: Reported on 03/06/2017 07/21/16   Montine Circle, PA-C    Family History Family History  Problem Relation Age  of Onset  . Colon cancer Maternal Uncle   . Colon cancer Cousin   . Dementia Mother   . Alcohol abuse Father     Social History Social History   Tobacco Use  . Smoking status: Current Every Day Smoker    Packs/day: 1.00    Types: Cigarettes  . Smokeless tobacco: Never Used  Substance Use Topics  . Alcohol use: No  . Drug use: No     Allergies   Patient has no known allergies.   Review of Systems Review of Systems  Constitutional: Negative for chills, diaphoresis and fever.  Eyes: Negative for visual disturbance.  Respiratory: Positive for shortness of breath. Negative for cough.     Cardiovascular: Positive for chest pain. Negative for leg swelling.  Gastrointestinal: Positive for diarrhea. Negative for abdominal pain, nausea and vomiting.       Stool incontinence  Musculoskeletal: Positive for back pain and neck pain.  Neurological: Positive for syncope and headaches. Negative for dizziness, weakness, light-headedness and numbness.  All other systems reviewed and are negative.    Physical Exam Updated Vital Signs BP 119/77   Pulse 90   Temp 98.5 F (36.9 C) (Oral)   Resp 15   Ht 5\' 11"  (1.803 m)   Wt 104.3 kg   SpO2 94%   BMI 32.08 kg/m   Physical Exam Vitals signs and nursing note reviewed.  Constitutional:      General: He is not in acute distress.    Appearance: He is well-developed. He is not diaphoretic.  HENT:     Head: Normocephalic and atraumatic.     Comments: No noted tenderness, swelling, wounds, deformity, or instability to the face or scalp. No noted evidence of intraoral trauma.    Right Ear: Tympanic membrane, ear canal and external ear normal.     Left Ear: Tympanic membrane, ear canal and external ear normal.     Nose: Nose normal.     Mouth/Throat:     Mouth: Mucous membranes are dry.     Pharynx: Oropharynx is clear.  Eyes:     Extraocular Movements: Extraocular movements intact.     Conjunctiva/sclera: Conjunctivae normal.     Pupils: Pupils are equal, round, and reactive to light.  Neck:     Musculoskeletal: Neck supple.   Cardiovascular:     Rate and Rhythm: Normal rate and regular rhythm.     Pulses: Normal pulses.          Radial pulses are 2+ on the right side and 2+ on the left side.       Posterior tibial pulses are 2+ on the right side and 2+ on the left side.     Heart sounds: Normal heart sounds.     Comments: BP: 123/70 Left arm, 116/77 Right arm Pulmonary:     Effort: Pulmonary effort is normal. No respiratory distress.     Breath sounds: Normal breath sounds.     Comments: No increased work of breathing.   Speaks in full sentences without difficulty. Chest:     Chest wall: Tenderness present. No deformity, swelling or crepitus.    Abdominal:     Palpations: Abdomen is soft.     Tenderness: There is no abdominal tenderness. There is no guarding.  Musculoskeletal:       Back:     Right lower leg: No edema.     Left lower leg: No edema.     Comments: Tenderness throughout the cervical, thoracic, and  lumbar back.  No step-off, deformities, swelling, or instability noted.  Overall trauma exam performed without abnormalities noted other than those mentioned.  Lymphadenopathy:     Cervical: No cervical adenopathy.  Skin:    General: Skin is warm and dry.     Capillary Refill: Capillary refill takes less than 2 seconds.  Neurological:     Mental Status: He is alert and oriented to person, place, and time.     Comments: Sensation grossly intact to light touch in the extremities. Strength 5/5 in all extremities. Coordination intact. Cranial nerves III-XII grossly intact. No facial droop.   Gait testing was performed after all imaging studies were negative for acute abnormalities.  Patient was able to ambulate without assistance or noted gait deficit.  Psychiatric:        Behavior: Behavior normal.      ED Treatments / Results  Labs (all labs ordered are listed, but only abnormal results are displayed) Labs Reviewed  BASIC METABOLIC PANEL - Abnormal; Notable for the following components:      Result Value   Glucose, Bld 153 (*)    Creatinine, Ser 1.41 (*)    GFR calc non Af Amer 53 (*)    All other components within normal limits  CBC - Abnormal; Notable for the following components:   WBC 14.9 (*)    All other components within normal limits  URINALYSIS, ROUTINE W REFLEX MICROSCOPIC - Abnormal; Notable for the following components:   Color, Urine STRAW (*)    All other components within normal limits  HEPATIC FUNCTION PANEL - Abnormal; Notable for the following components:   ALT  47 (*)    All other components within normal limits  CBG MONITORING, ED - Abnormal; Notable for the following components:   Glucose-Capillary 153 (*)    All other components within normal limits  I-STAT CHEM 8, ED - Abnormal; Notable for the following components:   Creatinine, Ser 1.30 (*)    Glucose, Bld 151 (*)    All other components within normal limits  ETHANOL  RAPID URINE DRUG SCREEN, HOSP PERFORMED  LIPASE, BLOOD  I-STAT TROPONIN, ED  I-STAT TROPONIN, ED    EKG EKG Interpretation  Date/Time:  Sunday January 29 2018 05:41:36 EST Ventricular Rate:  93 PR Interval:  138 QRS Duration: 76 QT Interval:  360 QTC Calculation: 447 R Axis:   18 Text Interpretation:  Normal sinus rhythm Normal ECG No significant change since last tracing Confirmed by Addison Lank [id number] on 01/29/2018 6:07:29 AM   Radiology Ct Angio Head W Or Wo Contrast  Result Date: 01/29/2018 CLINICAL DATA:  Syncopal episode with possible head trauma. Intermittent dizziness. EXAM: CT ANGIOGRAPHY HEAD AND NECK TECHNIQUE: Multidetector CT imaging of the head and neck was performed using the standard protocol during bolus administration of intravenous contrast. Multiplanar CT image reconstructions and MIPs were obtained to evaluate the vascular anatomy. Carotid stenosis measurements (when applicable) are obtained utilizing NASCET criteria, using the distal internal carotid diameter as the denominator. CONTRAST:  28mL ISOVUE-370 IOPAMIDOL (ISOVUE-370) INJECTION 76%, 67mL ISOVUE-370 IOPAMIDOL (ISOVUE-370) INJECTION 76% COMPARISON:  None. FINDINGS: CT HEAD FINDINGS Brain: No evidence of atrophy, old or acute infarction, mass lesion, hemorrhage, hydrocephalus or extra-axial collection. Subependymal calcification along the left lateral margin of the fourth ventricle could represent a small insignificant calcified sub ependymoma. Vascular: No abnormal vascular finding. Skull: Negative Sinuses: Clear Orbits: Normal Review of the  MIP images confirms the above findings CTA NECK FINDINGS Aortic arch: Mild  aortic atherosclerotic calcification. No aneurysm or dissection. Right carotid system: Common carotid artery widely patent to the bifurcation. Minimal atherosclerotic plaque at the carotid bifurcation but no stenosis. Cervical ICA widely patent. Left carotid system: Common carotid artery widely patent to the bifurcation. Mild atherosclerotic plaque at carotid bifurcation. Minimal diameter of the proximal ICA is 4 mm. Compared to a more distal cervical ICA diameter of 5 mm, this indicates a 20% stenosis. Mild irregularity of the cervical ICA probably indicates mild fibromuscular dysplasia. No evidence of stenosis or pseudo aneurysm. Vertebral arteries: Right vertebral artery has a normal origin and is widely patent through the cervical region. Left vertebral artery originates from the aortic arch and is widely patent through the cervical region to the foramen magnum. Skeleton: Very ordinary cervical spondylosis. Other neck: No mass or lymphadenopathy. Upper chest: Calcified granuloma in the right upper lobe. Mild pulmonary scarring. Review of the MIP images confirms the above findings CTA HEAD FINDINGS Anterior circulation: Both internal carotid arteries are patent through the skull base and siphon regions. No siphon stenosis. The anterior and middle cerebral vessels are patent without proximal stenosis, aneurysm or vascular malformation. Both anterior cerebral is a receive there supply from the right carotid circulation. Posterior circulation: Both vertebral arteries are widely patent to the basilar. No basilar stenosis. Posterior circulation branch vessels are patent. Mild atherosclerotic irregularity of the PCA branches but without dominant stenosis. Venous sinuses: Patent and normal. Anatomic variants: None significant. Delayed phase: No abnormal enhancement. Review of the MIP images confirms the above findings IMPRESSION: 1. No acute or  traumatic finding. No cause of the presenting symptoms is identified. 2. Mild atherosclerotic disease at both carotid bifurcations. 20% stenosis of the proximal ICA on the left. Mild irregularity of the cervical ICA on the left suggesting mild fibromuscular dysplasia. No stenosis or pseudo aneurysm. 3. No intracranial large or medium vessel occlusion or correctable proximal stenosis. Mild atherosclerotic irregularity of the PCA branches. Electronically Signed   By: Nelson Chimes M.D.   On: 01/29/2018 08:17   Ct Angio Neck W And/or Wo Contrast  Result Date: 01/29/2018 CLINICAL DATA:  Syncopal episode with possible head trauma. Intermittent dizziness. EXAM: CT ANGIOGRAPHY HEAD AND NECK TECHNIQUE: Multidetector CT imaging of the head and neck was performed using the standard protocol during bolus administration of intravenous contrast. Multiplanar CT image reconstructions and MIPs were obtained to evaluate the vascular anatomy. Carotid stenosis measurements (when applicable) are obtained utilizing NASCET criteria, using the distal internal carotid diameter as the denominator. CONTRAST:  64mL ISOVUE-370 IOPAMIDOL (ISOVUE-370) INJECTION 76%, 79mL ISOVUE-370 IOPAMIDOL (ISOVUE-370) INJECTION 76% COMPARISON:  None. FINDINGS: CT HEAD FINDINGS Brain: No evidence of atrophy, old or acute infarction, mass lesion, hemorrhage, hydrocephalus or extra-axial collection. Subependymal calcification along the left lateral margin of the fourth ventricle could represent a small insignificant calcified sub ependymoma. Vascular: No abnormal vascular finding. Skull: Negative Sinuses: Clear Orbits: Normal Review of the MIP images confirms the above findings CTA NECK FINDINGS Aortic arch: Mild aortic atherosclerotic calcification. No aneurysm or dissection. Right carotid system: Common carotid artery widely patent to the bifurcation. Minimal atherosclerotic plaque at the carotid bifurcation but no stenosis. Cervical ICA widely patent. Left  carotid system: Common carotid artery widely patent to the bifurcation. Mild atherosclerotic plaque at carotid bifurcation. Minimal diameter of the proximal ICA is 4 mm. Compared to a more distal cervical ICA diameter of 5 mm, this indicates a 20% stenosis. Mild irregularity of the cervical ICA probably indicates mild fibromuscular dysplasia. No  evidence of stenosis or pseudo aneurysm. Vertebral arteries: Right vertebral artery has a normal origin and is widely patent through the cervical region. Left vertebral artery originates from the aortic arch and is widely patent through the cervical region to the foramen magnum. Skeleton: Very ordinary cervical spondylosis. Other neck: No mass or lymphadenopathy. Upper chest: Calcified granuloma in the right upper lobe. Mild pulmonary scarring. Review of the MIP images confirms the above findings CTA HEAD FINDINGS Anterior circulation: Both internal carotid arteries are patent through the skull base and siphon regions. No siphon stenosis. The anterior and middle cerebral vessels are patent without proximal stenosis, aneurysm or vascular malformation. Both anterior cerebral is a receive there supply from the right carotid circulation. Posterior circulation: Both vertebral arteries are widely patent to the basilar. No basilar stenosis. Posterior circulation branch vessels are patent. Mild atherosclerotic irregularity of the PCA branches but without dominant stenosis. Venous sinuses: Patent and normal. Anatomic variants: None significant. Delayed phase: No abnormal enhancement. Review of the MIP images confirms the above findings IMPRESSION: 1. No acute or traumatic finding. No cause of the presenting symptoms is identified. 2. Mild atherosclerotic disease at both carotid bifurcations. 20% stenosis of the proximal ICA on the left. Mild irregularity of the cervical ICA on the left suggesting mild fibromuscular dysplasia. No stenosis or pseudo aneurysm. 3. No intracranial large  or medium vessel occlusion or correctable proximal stenosis. Mild atherosclerotic irregularity of the PCA branches. Electronically Signed   By: Nelson Chimes M.D.   On: 01/29/2018 08:17   Ct T-spine No Charge  Result Date: 01/29/2018 CLINICAL DATA:  Chest pain over the last 2 weeks. Syncopal episode with fall. EXAM: CT THORACIC AND LUMBAR SPINE WITHOUT CONTRAST TECHNIQUE: Multidetector CT imaging of the thoracic and lumbar spine was performed without contrast. Multiplanar CT image reconstructions were also generated. COMPARISON:  None. FINDINGS: CT THORACIC SPINE FINDINGS Alignment: Mild cervicothoracic curvature convex to the left. No traumatic malalignment. Vertebrae: No thoracic spine fracture or focal bone lesion. Paraspinal and other soft tissues: Negative Disc levels: Ordinary disc bulges, most notable on the left at T7-8 and T8-9. Ordinary midthoracic facet osteoarthritis. No apparently significant stenosis of the canal or foramina. Posteromedial ribs are negative. CT LUMBAR SPINE FINDINGS Segmentation: 5 lumbar type vertebral bodies. Alignment: Normal Vertebrae: No fracture or traumatic finding. Paraspinal and other soft tissues: See results of abdominal CT. Disc levels: Small endplate osteophytes and mild disc bulges without evidence of advanced finding. Ordinary L4-5 and L5-S1 facet osteoarthritis without encroachment upon the neural structures. No significant canal or foraminal stenosis suspected. Mild bilateral sacroiliac osteoarthritis. IMPRESSION: No acute or traumatic finding. Ordinary mild degenerative changes. No advanced finding. No suspicion significant stenosis of the canal or foramina. Electronically Signed   By: Nelson Chimes M.D.   On: 01/29/2018 08:27   Ct L-spine No Charge  Result Date: 01/29/2018 CLINICAL DATA:  Chest pain over the last 2 weeks. Syncopal episode with fall. EXAM: CT THORACIC AND LUMBAR SPINE WITHOUT CONTRAST TECHNIQUE: Multidetector CT imaging of the thoracic and lumbar  spine was performed without contrast. Multiplanar CT image reconstructions were also generated. COMPARISON:  None. FINDINGS: CT THORACIC SPINE FINDINGS Alignment: Mild cervicothoracic curvature convex to the left. No traumatic malalignment. Vertebrae: No thoracic spine fracture or focal bone lesion. Paraspinal and other soft tissues: Negative Disc levels: Ordinary disc bulges, most notable on the left at T7-8 and T8-9. Ordinary midthoracic facet osteoarthritis. No apparently significant stenosis of the canal or foramina. Posteromedial ribs are negative.  CT LUMBAR SPINE FINDINGS Segmentation: 5 lumbar type vertebral bodies. Alignment: Normal Vertebrae: No fracture or traumatic finding. Paraspinal and other soft tissues: See results of abdominal CT. Disc levels: Small endplate osteophytes and mild disc bulges without evidence of advanced finding. Ordinary L4-5 and L5-S1 facet osteoarthritis without encroachment upon the neural structures. No significant canal or foraminal stenosis suspected. Mild bilateral sacroiliac osteoarthritis. IMPRESSION: No acute or traumatic finding. Ordinary mild degenerative changes. No advanced finding. No suspicion significant stenosis of the canal or foramina. Electronically Signed   By: Nelson Chimes M.D.   On: 01/29/2018 08:27   Dg Chest Portable 1 View  Result Date: 01/29/2018 CLINICAL DATA:  Subacute onset of bilateral chest pain. Status post fall. EXAM: PORTABLE CHEST 1 VIEW COMPARISON:  Chest radiograph performed 03/06/2017 FINDINGS: The lungs are well-aerated. Retrocardiac airspace opacity may reflect pneumonia. There is no evidence of significant pleural effusion or pneumothorax. The cardiomediastinal silhouette is mildly enlarged. No acute osseous abnormalities are seen. IMPRESSION: Retrocardiac airspace opacity may reflect pneumonia. Mild cardiomegaly. No displaced rib fracture seen. Electronically Signed   By: Garald Balding M.D.   On: 01/29/2018 06:49   Ct Angio  Chest/abd/pel For Dissection W And/or Wo Contrast  Result Date: 01/29/2018 CLINICAL DATA:  CT chest 04/18/2008. EXAM: CT ANGIOGRAPHY CHEST, ABDOMEN AND PELVIS TECHNIQUE: Multidetector CT imaging through the chest, abdomen and pelvis was performed using the standard protocol during bolus administration of intravenous contrast. Multiplanar reconstructed images and MIPs were obtained and reviewed to evaluate the vascular anatomy. CONTRAST:  60mL ISOVUE-370 IOPAMIDOL (ISOVUE-370) INJECTION 76%, 70mL ISOVUE-370 IOPAMIDOL (ISOVUE-370) INJECTION 76% COMPARISON:  None. FINDINGS: CTA CHEST FINDINGS Cardiovascular: Preferential opacification of the thoracic aorta. No evidence of thoracic aortic aneurysm or dissection. Normal heart size. No pericardial effusion. There is mild aortic atherosclerosis. The main pulmonary artery appears patent without saddle embolus. Mediastinum/Nodes: No enlarged mediastinal, hilar, or axillary lymph nodes. Thyroid gland, trachea, and esophagus demonstrate no significant findings. Lungs/Pleura: No pleural effusion. Mild changes of centrilobular emphysema with diffuse bronchial wall thickening. No pneumothorax, airspace consolidation or atelectasis. Calcified granuloma identified within the right upper lobe small noncalcified right upper lobe lung nodule measures 4 mm, unchanged from previous exam. This is compatible with a benign abnormality. Small nodule in the left upper lobe measures 4 mm and is new from previous study, image 41/7. Musculoskeletal: Thoracic spondylosis.  No acute abnormality. Review of the MIP images confirms the above findings. CTA ABDOMEN AND PELVIS FINDINGS VASCULAR Aorta: Normal caliber aorta without aneurysm, dissection, vasculitis or significant stenosis. Celiac: Patent without evidence of aneurysm, dissection, vasculitis or significant stenosis. SMA: Patent without evidence of aneurysm, dissection, vasculitis or significant stenosis. Renals: Both renal arteries are  patent without evidence of aneurysm, dissection, vasculitis, fibromuscular dysplasia or significant stenosis. IMA: Patent without evidence of aneurysm, dissection, vasculitis or significant stenosis. Inflow: Patent without evidence of aneurysm, dissection, vasculitis or significant stenosis. Veins: No obvious venous abnormality within the limitations of this arterial phase study. Review of the MIP images confirms the above findings. NON-VASCULAR Hepatobiliary: No focal liver abnormality is seen. No gallstones, gallbladder wall thickening, or biliary dilatation. Pancreas: Unremarkable. No pancreatic ductal dilatation or surrounding inflammatory changes. Spleen: Normal in size without focal abnormality. Adrenals/Urinary Tract: Left adrenal nodule is indeterminate measuring 1.1 by 1.2 cm, image 162/6. Bilateral kidney cysts identified. No hydronephrosis or hydroureter identified bilaterally. Thick walled urinary bladder noted. Stomach/Bowel: Stomach is within normal limits. Appendix appears normal. No evidence of bowel wall thickening, distention, or inflammatory changes.  Lymphatic: No significant vascular findings are present. No enlarged abdominal or pelvic lymph nodes. Reproductive: Prostate gland enlargement. Other: No abdominal wall hernia or abnormality. No abdominopelvic ascites. Musculoskeletal: No acute or significant osseous findings. Review of the MIP images confirms the above findings. IMPRESSION: 1. No acute cardiopulmonary abnormalities. No evidence for thoracic or abdominal aortic aneurysm or dissection. 2. Mild aortic atherosclerosis. 3. Left upper lobe lung nodule measuring 4 mm is new from previous exam. No follow-up needed if patient is low-risk. Non-contrast chest CT can be considered in 12 months if patient is high-risk. This recommendation follows the consensus statement: Guidelines for Management of Incidental Pulmonary Nodules Detected on CT Images: From the Fleischner Society 2017; Radiology  2017; 284:228-243. 4. Indeterminate left adrenal nodule measuring 1.2 cm. Recommend follow-up imaging with nonemergent adrenal protocol MRI. 5. Diffuse bronchial wall thickening with emphysema, as above; imaging findings suggestive of underlying COPD. 6. Aortic Atherosclerosis (ICD10-I70.0) and Emphysema (ICD10-J43.9). Electronically Signed   By: Kerby Moors M.D.   On: 01/29/2018 08:17    Procedures Procedures (including critical care time)  Medications Ordered in ED Medications  sodium chloride 0.9 % bolus 1,000 mL (0 mLs Intravenous Stopped 01/29/18 0748)    Followed by  sodium chloride 0.9 % bolus 1,000 mL (0 mLs Intravenous Stopped 01/29/18 0748)  fentaNYL (SUBLIMAZE) injection 50 mcg (50 mcg Intravenous Given 01/29/18 0710)  iopamidol (ISOVUE-370) 76 % injection (80 mLs  Contrast Given 01/29/18 0700)  iopamidol (ISOVUE-370) 76 % injection (50 mLs  Contrast Given 01/29/18 0700)     Initial Impression / Assessment and Plan / ED Course  I have reviewed the triage vital signs and the nursing notes.  Pertinent labs & imaging results that were available during my care of the patient were reviewed by me and considered in my medical decision making (see chart for details).  Clinical Course as of Jan 29 1322  Sun Jan 29, 2018  0939 Patient denies symptoms of cough, fever, or persistent shortness of breath.  DG Chest Portable 1 View [SJ]    Clinical Course User Index [SJ] Mukund Weinreb C, PA-C    Patient presents for evaluation following syncopal episode. Patient is nontoxic appearing, afebrile, not tachycardic, not tachypneic, maintains excellent SPO2 on room air, and is in no apparent distress.  Due to the patient's syncope and subsequent complaint of chest, back, neck, and head pain, there was some concern for dissection.  CTs were negative for acute abnormality. Patient had orthostatic changes that were incidentally noted early in his ED course.  Patient was sat up in order to go to x-ray,  stated he did not feel well, and blood pressure was noted to be 81/57.  Some elevation in patient's creatinine gives suspicion for dehydration. Patient had resolution in all of his complaints other than his back pain, which is reproducible on exam.  Patient ambulated without assistance and without gait deficit.  No focal neuro deficits.  Delta troponins negative.  PCP follow-up. The patient was given instructions for home care as well as return precautions. Patient voices understanding of these instructions, accepts the plan, and is comfortable with discharge.  Findings and plan of care discussed with Addison Lank, MD.   Vitals:   01/29/18 1130 01/29/18 1145 01/29/18 1200 01/29/18 1215  BP: 132/71 (!) 145/86 (!) 148/97   Pulse: 85 90 90 89  Resp: (!) 23 (!) 22 (!) 23 (!) 22  Temp:      TempSrc:      SpO2: 98%  98% 97% 98%  Weight:      Height:         Final Clinical Impressions(s) / ED Diagnoses   Final diagnoses:  Tenderness of lumbar region  Syncope and collapse    ED Discharge Orders         Ordered    methocarbamol (ROBAXIN) 500 MG tablet  2 times daily     01/29/18 1309    lidocaine (LIDODERM) 5 %  Every 24 hours     01/29/18 1309           Layla Maw 01/29/18 1335    Fatima Blank, MD 01/31/18 1228

## 2018-01-29 NOTE — ED Notes (Signed)
Pt reports feeling dizzy and requests to lay down, this RN reassessed vitals, BP 81/57, Charge RN notified for need for a room

## 2018-01-29 NOTE — ED Triage Notes (Signed)
Pt in c/o bil CP onset x 2 wks ago, pt reports it started hurting this morning and  He went to the bathroom after getting off of work and  Was on the floor and had BM incontinence, unknown if the pt hit his head, denies taking blood thinners, pt c/o  Neck and back pain, pt placed in c collar

## 2018-01-29 NOTE — ED Notes (Signed)
Pt made aware a urine sample has been ordered. Pt given urinal and confirmed understanding.

## 2018-01-29 NOTE — ED Notes (Signed)
Patient ambulated in hallway with complaints of pain in thoracic area pain level of 8. Patient complained of little dizziness and was steady on feet.

## 2018-01-29 NOTE — ED Notes (Signed)
Patient verbalizes understanding of discharge instructions. Opportunity for questioning and answers were provided. Armband removed by staff, pt discharged from ED. Prescriptions and follow up care reviewed. Pt wheeled to lobby where ride is picking him up.

## 2018-01-29 NOTE — Discharge Instructions (Addendum)
Expect your soreness to increase over the next 2-3 days. Take it easy, but do not lay around too much as this may make any stiffness worse.  Antiinflammatory medications: Take 600 mg of ibuprofen every 6 hours or 440 mg (over the counter dose) to 500 mg (prescription dose) of naproxen every 12 hours for the next 3 days. After this time, these medications may be used as needed for pain. Take these medications with food to avoid upset stomach. Choose only one of these medications, do not take them together. Acetaminophen (generic for Tylenol): Should you continue to have additional pain while taking the ibuprofen or naproxen, you may add in acetaminophen as needed. Your daily total maximum amount of acetaminophen from all sources should be limited to 4000mg /day for persons without liver problems, or 2000mg /day for those with liver problems. Muscle relaxer: Robaxin is a muscle relaxer and may help loosen stiff muscles. Do not take the Robaxin while driving or performing other dangerous activities.  Lidocaine patches: These are available via either prescription or over-the-counter. The over-the-counter option may be more economical one and are likely just as effective. There are multiple over-the-counter brands, such as Salonpas. Exercises: Be sure to perform the attached exercises starting with three times a week and working up to performing them daily. This is an essential part of preventing long term problems.  Follow up: Follow up with a primary care provider for any future management of these complaints. Be sure to follow up within 3 days. Return: Return to the ED should symptoms worsen.  Please also follow-up with your primary care provider regarding the original reason you passed out.  It could have been simply due to dehydration, however, further testing may need to be performed.  For prescription assistance, may try using prescription discount sites or apps, such as goodrx.com   There was a nodule  noted to the left upper lung.  Recommendations include noncontrast chest CT performed in 12 months for reevaluation of this nodule.  This scan should be managed through your primary care provider.

## 2018-03-13 DIAGNOSIS — R7303 Prediabetes: Secondary | ICD-10-CM | POA: Diagnosis not present

## 2018-03-13 DIAGNOSIS — R300 Dysuria: Secondary | ICD-10-CM | POA: Diagnosis not present

## 2018-03-13 DIAGNOSIS — N419 Inflammatory disease of prostate, unspecified: Secondary | ICD-10-CM | POA: Diagnosis not present

## 2018-03-13 DIAGNOSIS — R109 Unspecified abdominal pain: Secondary | ICD-10-CM | POA: Diagnosis not present

## 2018-03-13 DIAGNOSIS — R350 Frequency of micturition: Secondary | ICD-10-CM | POA: Diagnosis not present

## 2018-03-13 DIAGNOSIS — I10 Essential (primary) hypertension: Secondary | ICD-10-CM | POA: Diagnosis not present

## 2018-03-22 DIAGNOSIS — R82998 Other abnormal findings in urine: Secondary | ICD-10-CM | POA: Diagnosis not present

## 2018-03-23 DIAGNOSIS — R7303 Prediabetes: Secondary | ICD-10-CM | POA: Diagnosis not present

## 2018-03-23 DIAGNOSIS — Z125 Encounter for screening for malignant neoplasm of prostate: Secondary | ICD-10-CM | POA: Diagnosis not present

## 2018-03-23 DIAGNOSIS — E119 Type 2 diabetes mellitus without complications: Secondary | ICD-10-CM | POA: Diagnosis not present

## 2018-03-23 DIAGNOSIS — I10 Essential (primary) hypertension: Secondary | ICD-10-CM | POA: Diagnosis not present

## 2018-04-05 DIAGNOSIS — Z0000 Encounter for general adult medical examination without abnormal findings: Secondary | ICD-10-CM | POA: Diagnosis not present

## 2018-04-05 DIAGNOSIS — Z125 Encounter for screening for malignant neoplasm of prostate: Secondary | ICD-10-CM | POA: Diagnosis not present

## 2018-04-05 DIAGNOSIS — M109 Gout, unspecified: Secondary | ICD-10-CM | POA: Diagnosis not present

## 2018-04-05 DIAGNOSIS — I10 Essential (primary) hypertension: Secondary | ICD-10-CM | POA: Diagnosis not present

## 2018-04-05 DIAGNOSIS — E7849 Other hyperlipidemia: Secondary | ICD-10-CM | POA: Diagnosis not present

## 2018-04-05 DIAGNOSIS — E119 Type 2 diabetes mellitus without complications: Secondary | ICD-10-CM | POA: Diagnosis not present

## 2018-04-05 DIAGNOSIS — Z1331 Encounter for screening for depression: Secondary | ICD-10-CM | POA: Diagnosis not present

## 2018-04-11 ENCOUNTER — Other Ambulatory Visit: Payer: Self-pay | Admitting: Internal Medicine

## 2018-04-11 DIAGNOSIS — B1920 Unspecified viral hepatitis C without hepatic coma: Secondary | ICD-10-CM

## 2018-04-11 DIAGNOSIS — R1011 Right upper quadrant pain: Secondary | ICD-10-CM

## 2018-10-12 DIAGNOSIS — B1920 Unspecified viral hepatitis C without hepatic coma: Secondary | ICD-10-CM | POA: Diagnosis not present

## 2018-10-12 DIAGNOSIS — E119 Type 2 diabetes mellitus without complications: Secondary | ICD-10-CM | POA: Diagnosis not present

## 2018-10-12 DIAGNOSIS — I10 Essential (primary) hypertension: Secondary | ICD-10-CM | POA: Diagnosis not present

## 2018-10-12 DIAGNOSIS — E785 Hyperlipidemia, unspecified: Secondary | ICD-10-CM | POA: Diagnosis not present

## 2018-10-23 DIAGNOSIS — B1920 Unspecified viral hepatitis C without hepatic coma: Secondary | ICD-10-CM | POA: Diagnosis not present

## 2018-10-23 DIAGNOSIS — Z23 Encounter for immunization: Secondary | ICD-10-CM | POA: Diagnosis not present

## 2018-10-23 DIAGNOSIS — E119 Type 2 diabetes mellitus without complications: Secondary | ICD-10-CM | POA: Diagnosis not present

## 2018-11-08 DIAGNOSIS — B182 Chronic viral hepatitis C: Secondary | ICD-10-CM | POA: Diagnosis not present

## 2018-12-15 ENCOUNTER — Other Ambulatory Visit: Payer: Self-pay

## 2018-12-15 ENCOUNTER — Encounter (HOSPITAL_COMMUNITY): Payer: Self-pay

## 2018-12-15 ENCOUNTER — Ambulatory Visit (HOSPITAL_COMMUNITY)
Admission: EM | Admit: 2018-12-15 | Discharge: 2018-12-15 | Disposition: A | Discharge status: Home / Self Care | Payer: BC Managed Care – PPO | Attending: Emergency Medicine | Admitting: Emergency Medicine

## 2018-12-15 DIAGNOSIS — M79674 Pain in right toe(s): Secondary | ICD-10-CM | POA: Diagnosis not present

## 2018-12-15 DIAGNOSIS — M25561 Pain in right knee: Secondary | ICD-10-CM | POA: Diagnosis not present

## 2018-12-15 DIAGNOSIS — I10 Essential (primary) hypertension: Secondary | ICD-10-CM

## 2018-12-15 DIAGNOSIS — M109 Gout, unspecified: Secondary | ICD-10-CM

## 2018-12-15 MED ORDER — METHYLPREDNISOLONE ACETATE 80 MG/ML IJ SUSP
80.0000 mg | Freq: Once | INTRAMUSCULAR | Status: AC
  Administered 2018-12-15: 15:00:00 80 mg via INTRAMUSCULAR

## 2018-12-15 MED ORDER — METHYLPREDNISOLONE ACETATE 80 MG/ML IJ SUSP
INTRAMUSCULAR | Status: AC
  Filled 2018-12-15: qty 1

## 2018-12-15 MED ORDER — PREDNISONE 20 MG PO TABS: 40.0000 mg | ORAL_TABLET | Freq: Every day | ORAL | 0 refills | Status: AC

## 2018-12-15 NOTE — ED Provider Notes (Signed)
[place]    CSN: [id number] Arrival date & time: 12/15/18  1324      History   Chief Complaint Chief Complaint  Patient presents with  . Foot Pain  . Knee Pain    HPI [name]. is a 63 y.o. male.   Lance Goltz Sr. presents with complaints of right knee and right great toe pain. Knee pain started 1 week ago and toe pain started two days ago. No fevers. No injury. He drives tractor trailer. Has had gout and arthritis in the past. Last gout flare approximately 1 year ago. Denies ETOH use. Endorses drinking water regularly. Takes meloxicam 15mg  daily regularly for joint pain, but this has not been helpful. Applied ace wrap as well as topical pain relief to right knee which hasn't helped. He is not diabetic.     ROS per HPI, negative if not otherwise mentioned.      Past Medical History:  Diagnosis Date  . Dyslipidemia   . History of gout   . Hypertension   . Normal coronary arteries 12/2007    Patient Active Problem List   Diagnosis Date Noted  . Near syncope 03/06/2017  . Elevated troponin 03/06/2017  . Hyperglycemia 03/06/2017  . Hypertension   . Dyslipidemia   . History of gout   . Normal coronary arteries 12/26/2007    Past Surgical History:  Procedure Laterality Date  . CARDIAC CATHETERIZATION  12/2007   normal coronaries       Home Medications    Prior to Admission medications   Medication Sig Start Date End Date Taking? Authorizing Provider  aspirin EC 81 MG EC tablet Take 1 tablet (81 mg total) by mouth daily. Patient not taking: Reported on 01/29/2018 03/08/17   Lance Memos, DO  atorvastatin (LIPITOR) 40 MG tablet Take 1 tablet (40 mg total) by mouth daily at 6 PM. Patient not taking: Reported on 01/29/2018 03/07/17   Lance Memos, DO  diclofenac sodium (VOLTAREN) 1 % GEL Apply 2 g topically 4 (four) times daily. Patient not taking: Reported on 03/06/2017 09/29/16   Lance Edwards, PA-C  HYDROcodone-acetaminophen (NORCO/VICODIN)  5-325 MG tablet Take 1 tablet by mouth every 6 (six) hours as needed for severe pain. Patient not taking: Reported on 01/29/2018 12/18/17   [name] A, PA-C  lidocaine (LIDODERM) 5 % Place 1 patch onto the skin daily. Remove & Discard patch within 12 hours or as directed by MD 01/29/18   Joy, Helane Gunther, PA-C  lisinopril-hydrochlorothiazide (PRINZIDE,ZESTORETIC) 20-12.5 MG per tablet Take 1 tablet by mouth Daily. 09/30/11   [provider]  meloxicam (MOBIC) 15 MG tablet Take 15 mg by mouth daily.    [provider]  methocarbamol (ROBAXIN) 500 MG tablet Take 1 tablet (500 mg total) by mouth 2 (two) times daily. 01/29/18   Joy, Shawn C, PA-C  Multiple Vitamins-Minerals (MULTIVITAMIN WITH MINERALS) tablet Take 1 tablet by mouth daily.    [provider]  ondansetron (ZOFRAN) 4 MG tablet Take 1 tablet (4 mg total) by mouth every 6 (six) hours. Patient not taking: Reported on 03/06/2017 07/21/16   Lance Circle, PA-C  predniSONE (DELTASONE) 20 MG tablet Take 2 tablets (40 mg total) by mouth daily with breakfast for 3 days. 12/15/18 12/18/18  Lance Gottron, NP    Family History Family History  Problem Relation Age of Onset  . Colon cancer Maternal Uncle   . Colon cancer Cousin   . Dementia  Mother   . Alcohol abuse Father     Social History Social History   Tobacco Use  . Smoking status: Current Every Day Smoker    Packs/day: 1.00    Types: Cigarettes  . Smokeless tobacco: Never Used  Substance Use Topics  . Alcohol use: No  . Drug use: No     Allergies   Patient has no known allergies.   Review of Systems Review of Systems   Physical Exam Triage Vital Signs ED Triage Vitals  Enc Vitals Group     BP 12/15/18 1410 (!) 152/88     Pulse Rate 12/15/18 1410 80     Resp 12/15/18 1410 16     Temp 12/15/18 1410 98.8 F (37.1 C)     Temp Source 12/15/18 1410 Oral     SpO2 12/15/18 1410 100 %     Weight --      Height --      Head Circumference --       Peak Flow --      Pain Score 12/15/18 1415 10     Pain Loc --      Pain Edu? --      Excl. in [place]? --    No data found.  Updated Vital Signs BP (!) 152/88 (BP Location: Left Arm)   Pulse 80   Temp 98.8 F (37.1 C) (Oral)   Resp 16   SpO2 100%    Physical Exam Constitutional:      Appearance: He is well-developed.  Cardiovascular:     Rate and Rhythm: Normal rate.  Pulmonary:     Effort: Pulmonary effort is normal.  Musculoskeletal:     Right knee: He exhibits swelling and bony tenderness. He exhibits normal range of motion, no effusion, no ecchymosis, no deformity, no laceration, no erythema, normal alignment, no LCL laxity, normal patellar mobility, normal meniscus and no MCL laxity. Tenderness found. Medial joint line, lateral joint line and patellar tendon tenderness noted.     Comments: Generalized right knee tenderness on palpation as well as pain with extension, mild pain with flexion; mild swelling, no redness or warmth; right great toe MTP joint with tenderness and mild swelling; cap refill < 2 seconds; strong pedal pulse   Skin:    General: Skin is warm and dry.  Neurological:     Mental Status: He is alert and oriented to person, place, and time.      UC Treatments / Results  Labs (all labs ordered are listed, but only abnormal results are displayed) Labs Reviewed - No data to display  EKG   Radiology No results found.  Procedures Procedures (including critical care time)  Medications Ordered in UC Medications  methylPREDNISolone acetate (DEPO-MEDROL) injection 80 mg (has no administration in time range)    Initial Impression / Assessment and Plan / UC Course  I have reviewed the triage vital signs and the nursing notes.  Pertinent labs & imaging results that were available during my care of the patient were reviewed by me and considered in my medical decision making (see chart for details).     Gout flare to right toe likely, gout vs arthritis to  right knee. No indication of septic joint; no injury or trauma. depomedrol IM provided today, three additional days of oral prednisone. Return precautions provided. Patient verbalized understanding and agreeable to plan.   Final Clinical Impressions(s) / UC Diagnoses   Final diagnoses:  Acute gout involving toe of right foot, unspecified  cause  Acute pain of right knee     Discharge Instructions     Ice, elevation, use of ace wrap as needed for pain.  Three additional days of prednisone.  May continue with your daily meloxicam.  If symptoms worsen or do not improve in the next week to return to be seen or to follow up with your PCP.     ED Prescriptions    Medication Sig Dispense Auth. Provider   predniSONE (DELTASONE) 20 MG tablet Take 2 tablets (40 mg total) by mouth daily with breakfast for 3 days. 6 tablet Lance Gottron, NP     PDMP not reviewed this encounter.   Lance Gottron, NP 12/15/18 1452

## 2018-12-15 NOTE — Discharge Instructions (Signed)
Ice, elevation, use of ace wrap as needed for pain.  Three additional days of prednisone.  May continue with your daily meloxicam.  If symptoms worsen or do not improve in the next week to return to be seen or to follow up with your PCP.

## 2018-12-15 NOTE — ED Triage Notes (Signed)
Patient presents to Urgent Care with complaints of right knee pain and right big toe pain since about a week ago. Patient reports he has gout in the past, thinks this may be a flare up, but also could be arthritis.

## 2019-03-01 DIAGNOSIS — B182 Chronic viral hepatitis C: Secondary | ICD-10-CM | POA: Diagnosis not present

## 2019-09-27 ENCOUNTER — Other Ambulatory Visit: Payer: Self-pay

## 2019-09-27 ENCOUNTER — Ambulatory Visit (HOSPITAL_COMMUNITY)
Admission: EM | Admit: 2019-09-27 | Discharge: 2019-09-27 | Disposition: A | Discharge status: Home / Self Care | Payer: 59 | Attending: Physician Assistant | Admitting: Physician Assistant

## 2019-09-27 DIAGNOSIS — Z20822 Contact with and (suspected) exposure to covid-19: Secondary | ICD-10-CM | POA: Diagnosis not present

## 2019-09-27 LAB — SARS CORONAVIRUS 2 (TAT 6-24 HRS): SARS Coronavirus 2: NEGATIVE

## 2019-09-27 NOTE — Discharge Instructions (Signed)

## 2019-09-27 NOTE — ED Triage Notes (Signed)
Pt presents with exposure to covid. Wife is positive at home. Pt denies symptoms. Educated on return precautions.

## 2019-10-02 ENCOUNTER — Emergency Department (HOSPITAL_COMMUNITY): Payer: 59

## 2019-10-02 ENCOUNTER — Encounter (HOSPITAL_COMMUNITY): Payer: Self-pay

## 2019-10-02 ENCOUNTER — Emergency Department (HOSPITAL_COMMUNITY)
Admit: 2019-10-02 | Discharge: 2019-10-02 | Disposition: A | Discharge status: Home / Self Care | Payer: 59 | Attending: Pulmonary Disease | Admitting: Pulmonary Disease

## 2019-10-02 ENCOUNTER — Other Ambulatory Visit (HOSPITAL_COMMUNITY): Payer: Self-pay | Admitting: Adult Health

## 2019-10-02 ENCOUNTER — Emergency Department (HOSPITAL_COMMUNITY)
Admission: EM | Admit: 2019-10-02 | Discharge: 2019-10-02 | Disposition: A | Discharge status: Home / Self Care | Payer: 59 | Attending: Emergency Medicine | Admitting: Emergency Medicine

## 2019-10-02 ENCOUNTER — Other Ambulatory Visit: Payer: Self-pay

## 2019-10-02 DIAGNOSIS — U071 COVID-19: Secondary | ICD-10-CM | POA: Diagnosis not present

## 2019-10-02 DIAGNOSIS — F17210 Nicotine dependence, cigarettes, uncomplicated: Secondary | ICD-10-CM | POA: Insufficient documentation

## 2019-10-02 DIAGNOSIS — Z79899 Other long term (current) drug therapy: Secondary | ICD-10-CM | POA: Diagnosis not present

## 2019-10-02 DIAGNOSIS — Z7982 Long term (current) use of aspirin: Secondary | ICD-10-CM | POA: Insufficient documentation

## 2019-10-02 DIAGNOSIS — R0602 Shortness of breath: Secondary | ICD-10-CM

## 2019-10-02 DIAGNOSIS — I10 Essential (primary) hypertension: Secondary | ICD-10-CM | POA: Diagnosis not present

## 2019-10-02 LAB — BASIC METABOLIC PANEL
Anion gap: 9 (ref 5–15)
BUN: 15 mg/dL (ref 8–23)
CO2: 23 mmol/L (ref 22–32)
Calcium: 9.4 mg/dL (ref 8.9–10.3)
Chloride: 106 mmol/L (ref 98–111)
Creatinine, Ser: 1.2 mg/dL (ref 0.61–1.24)
GFR calc Af Amer: >60 mL/min (ref >60)
GFR calc non Af Amer: >60 mL/min (ref >60)
Glucose, Bld: 141 mg/dL — ABNORMAL HIGH (ref 70–99)
Potassium: 3.7 mmol/L (ref 3.5–5.1)
Sodium: 138 mmol/L (ref 135–145)

## 2019-10-02 LAB — CBC
HCT: 40.8 % (ref 39.0–52.0)
Hemoglobin: 13.5 g/dL (ref 13.0–17.0)
MCH: 31.1 pg (ref 26.0–34.0)
MCHC: 33.1 g/dL (ref 30.0–36.0)
MCV: 94 fL (ref 80.0–100.0)
Platelets: 250 10*3/uL (ref 150–400)
RBC: 4.34 MIL/uL (ref 4.22–5.81)
RDW: 14.1 % (ref 11.5–15.5)
WBC: 9.7 10*3/uL (ref 4.0–10.5)
nRBC: 0 % (ref 0.0–0.2)

## 2019-10-02 LAB — TROPONIN I (HIGH SENSITIVITY)
Troponin I (High Sensitivity): 5 ng/L (ref <18)
Troponin I (High Sensitivity): 10 ng/L (ref <18)

## 2019-10-02 LAB — SARS CORONAVIRUS 2 BY RT PCR (HOSPITAL ORDER, PERFORMED IN ~~LOC~~ HOSPITAL LAB): SARS Coronavirus 2: POSITIVE — AB

## 2019-10-02 LAB — D-DIMER, QUANTITATIVE: D-Dimer, Quant: 0.75 ug/mL-FEU — ABNORMAL HIGH (ref 0.00–0.50)

## 2019-10-02 IMAGING — CR DG CHEST 2V
2 series · 2 of 2 positions shown · non-contrast
Comparison: None.

CLINICAL DATA: Cough and chest pain

EXAM:
CHEST - 2 VIEW

[w chest pa]
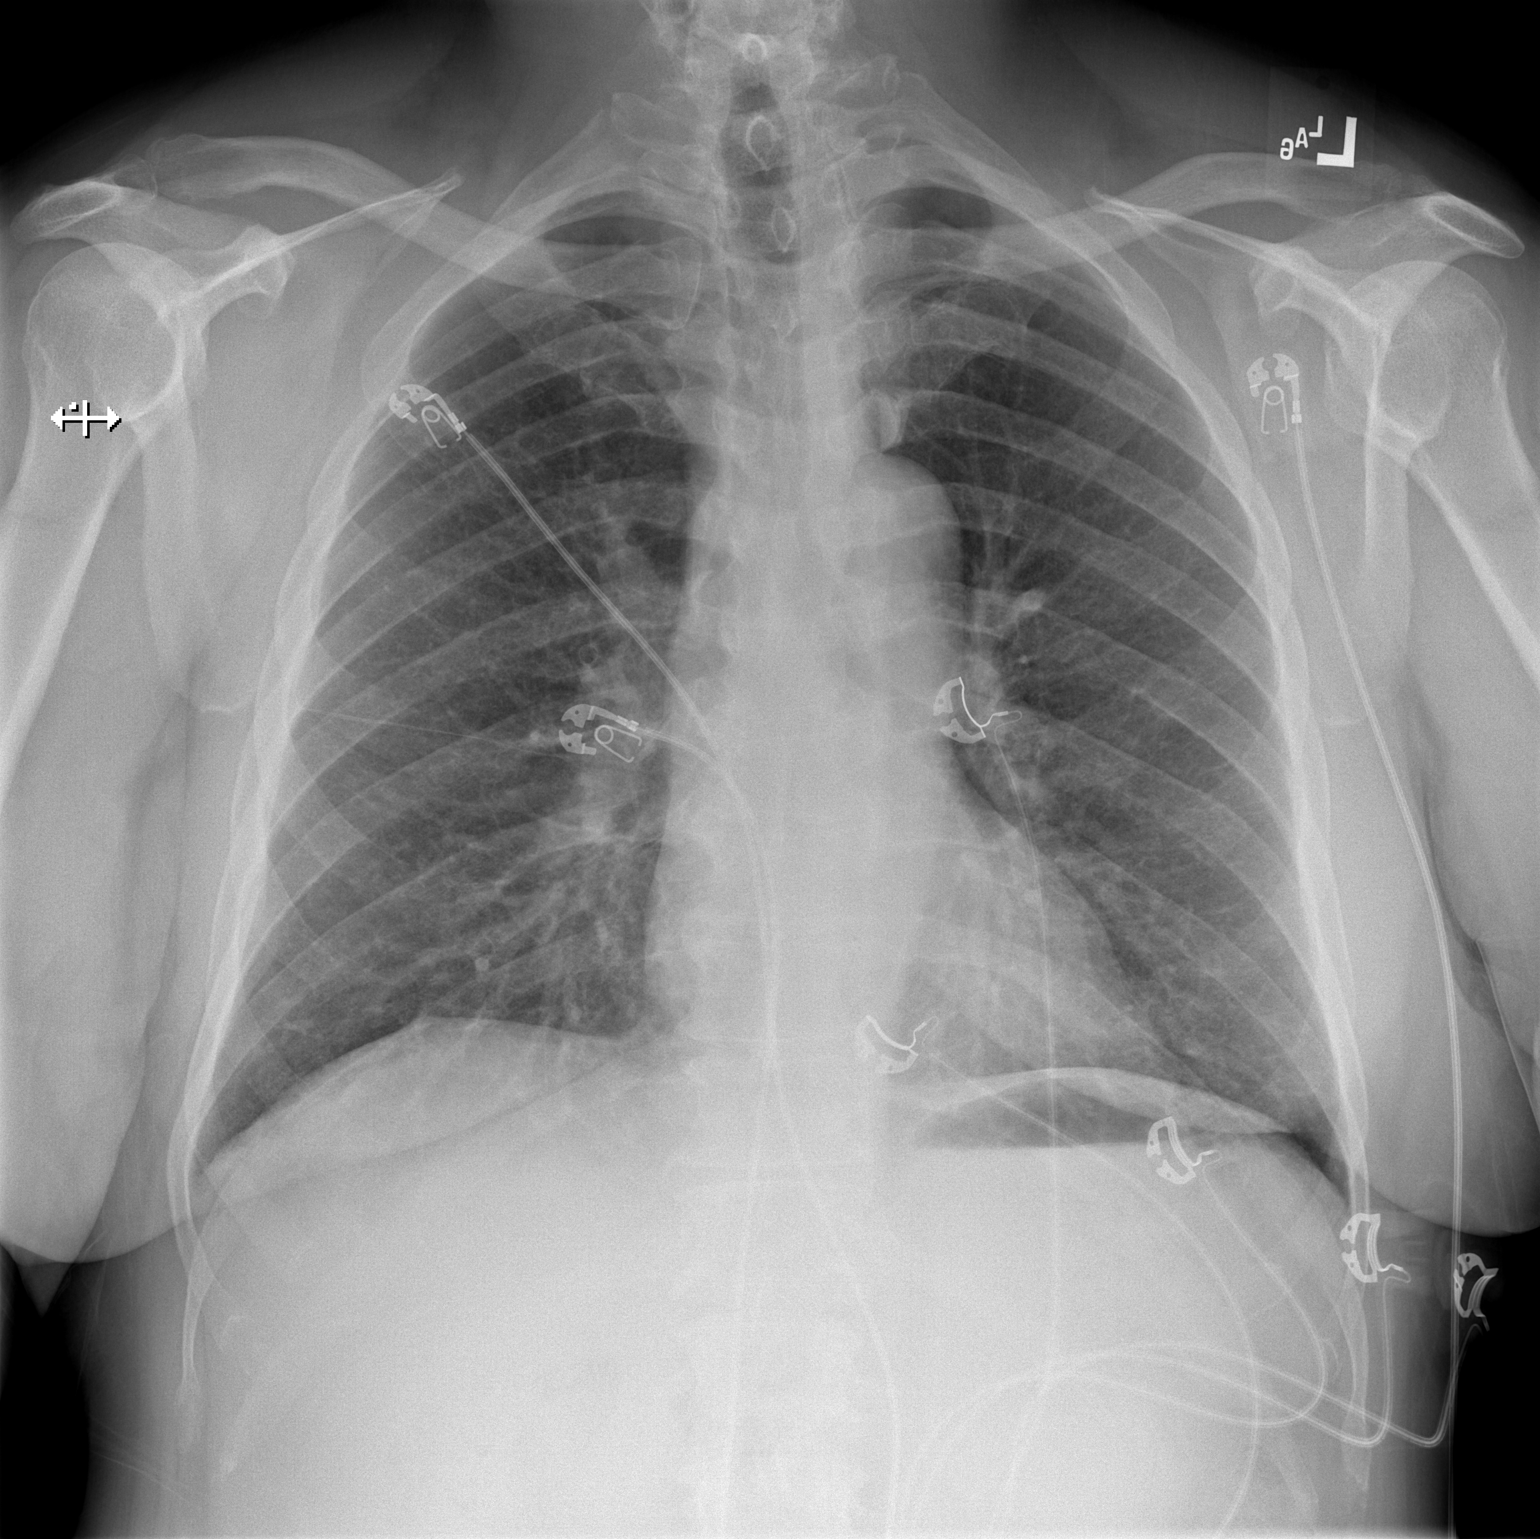

[w chest lat]
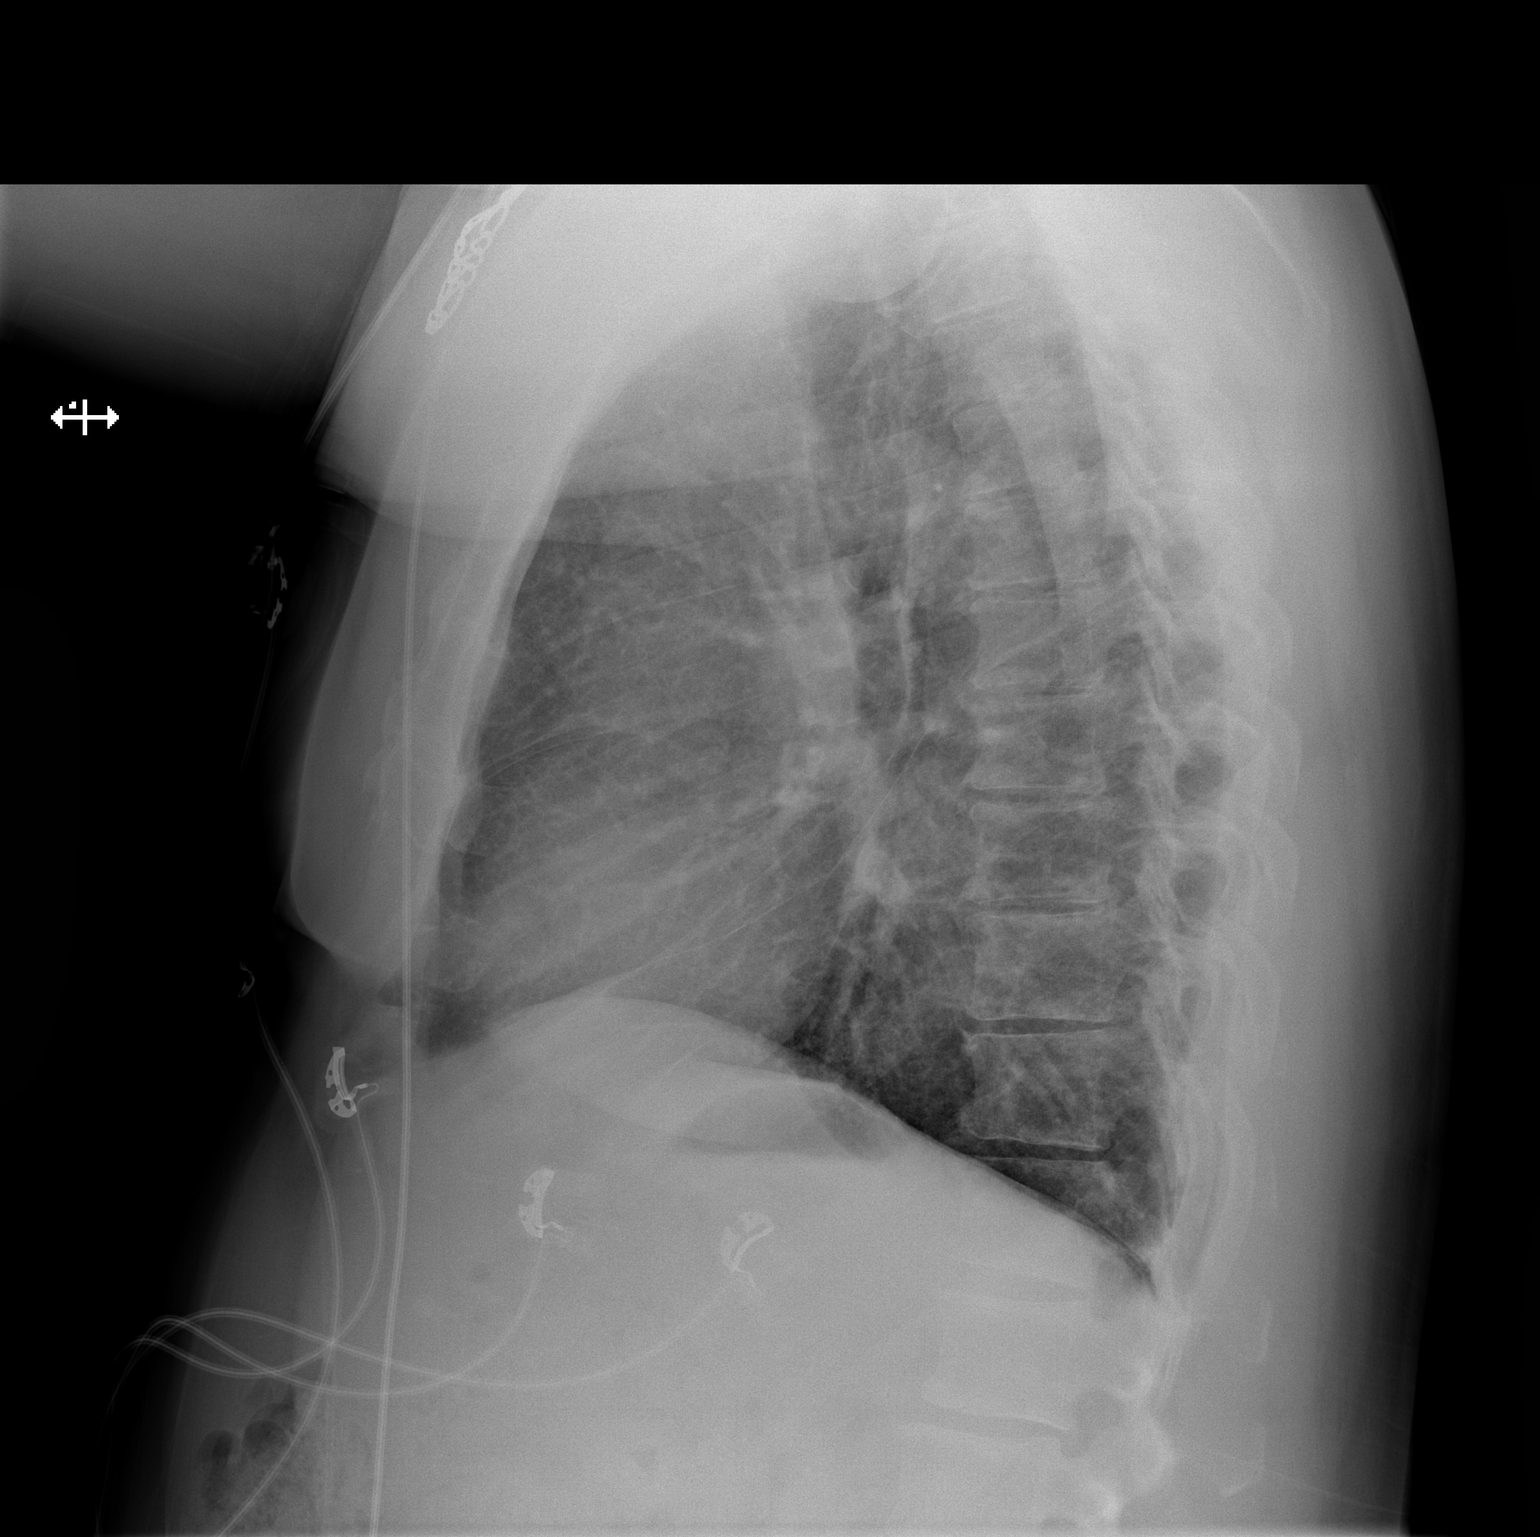

[2 of 2 positions shown; findings below may reference images not displayed]

FINDINGS: The heart size and mediastinal contours are within normal limits.
Both lungs are clear. The visualized skeletal structures are
unremarkable.
IMPRESSION: No active cardiopulmonary disease.

## 2019-10-02 IMAGING — CT CT ANGIO CHEST
2 of 6 series · 18 of 36 positions shown · IV contrast (omnipaque)
Comparison: [DATE]

CLINICAL DATA: Shortness of breath, COVID positive with history of
vaccination

EXAM:
CT ANGIOGRAPHY CHEST WITH CONTRAST
TECHNIQUE: Multidetector CT imaging of the chest was performed using the
standard protocol during bolus administration of intravenous
contrast. Multiplanar CT image reconstructions and MIPs were
obtained to evaluate the vascular anatomy.
CONTRAST:  77mL OMNIPAQUE IOHEXOL 350 MG/ML SOLN

[Series 5: thins · axial · 0.71mm/px · z∈[-273,-9]mm · 17 of 298 slices shown]
[im 17/298  lung]
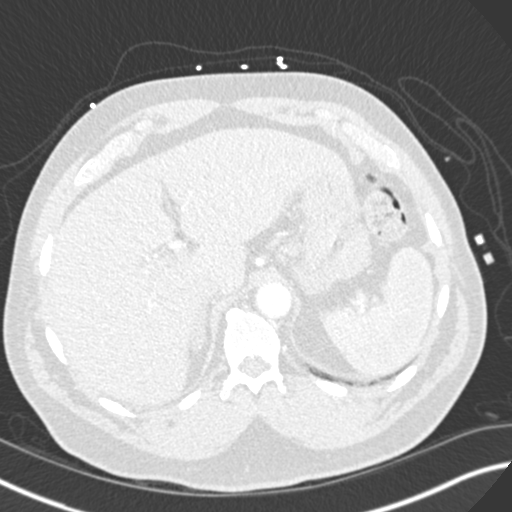
[im 34/298  mediastinal]
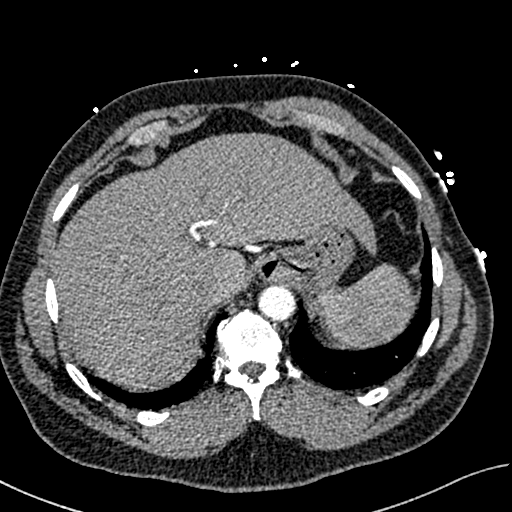
[im 50/298  lung]
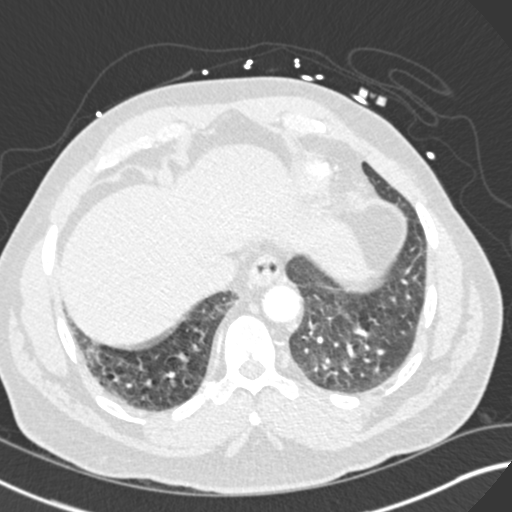
[im 67/298  mediastinal]
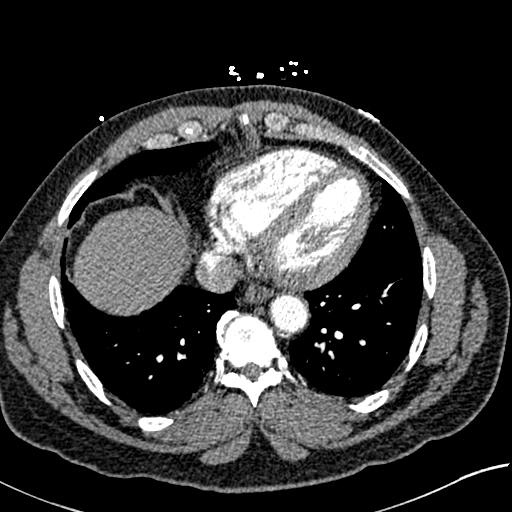
[im 83/298  lung]
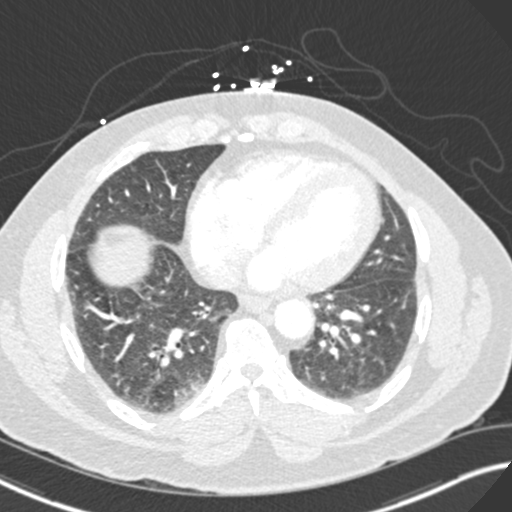
[im 100/298  mediastinal]
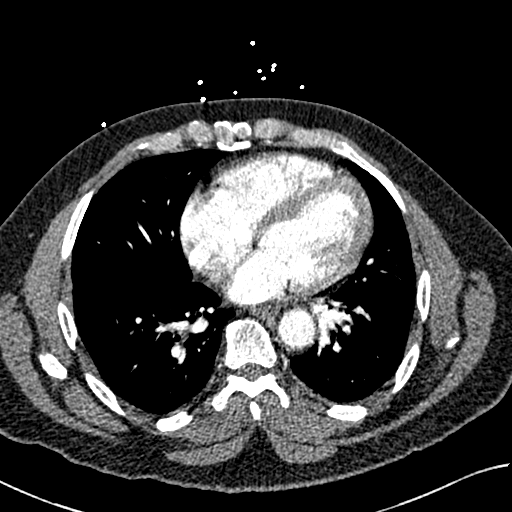
[im 116/298  lung]
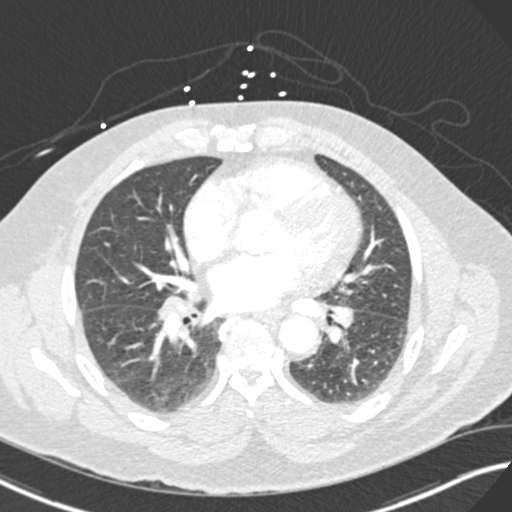
[im 133/298  mediastinal]
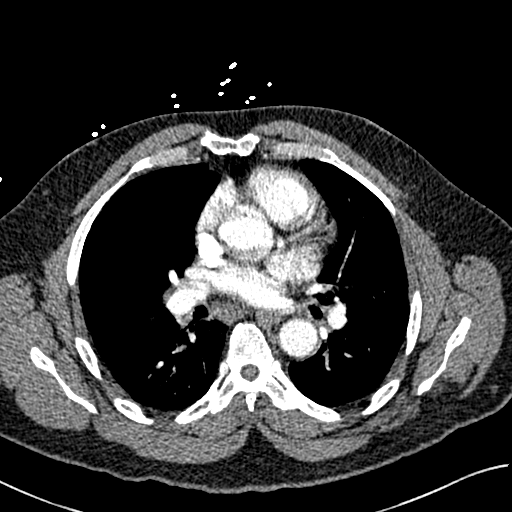
[im 149/298  lung]
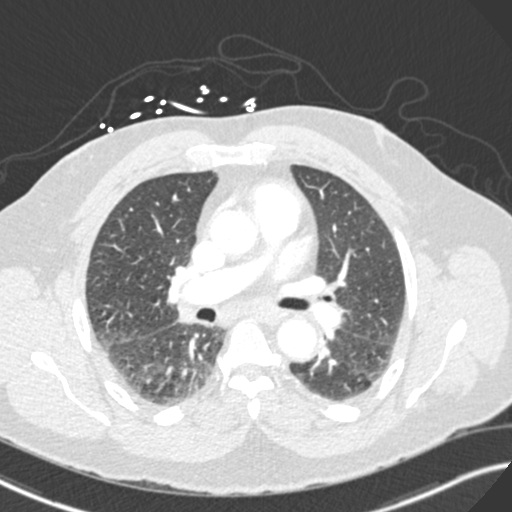
[im 166/298  mediastinal]
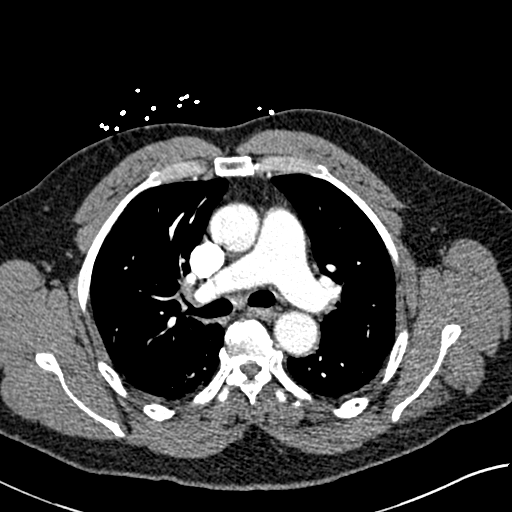
[im 182/298  lung]
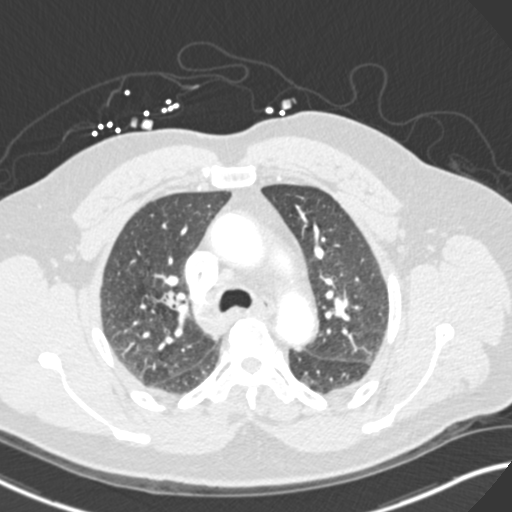
[im 199/298  mediastinal]
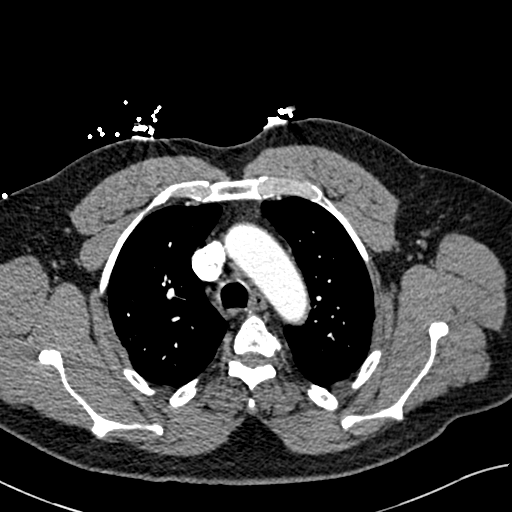
[im 215/298  lung]
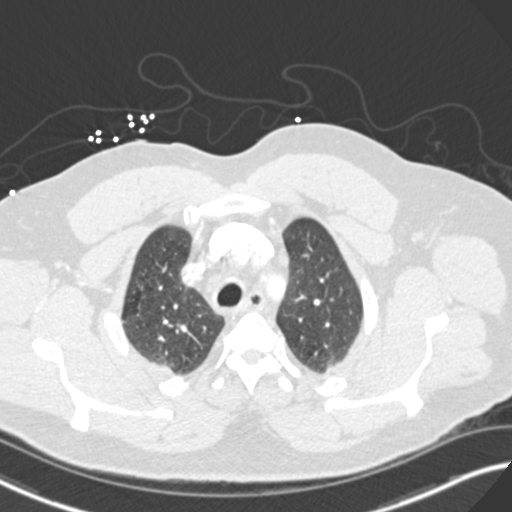
[im 232/298  mediastinal]
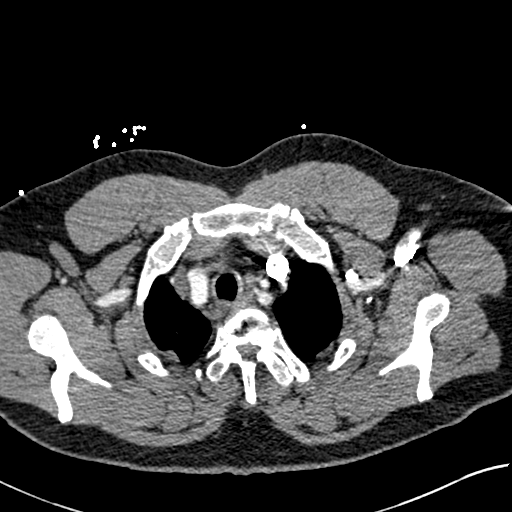
[im 248/298  lung]
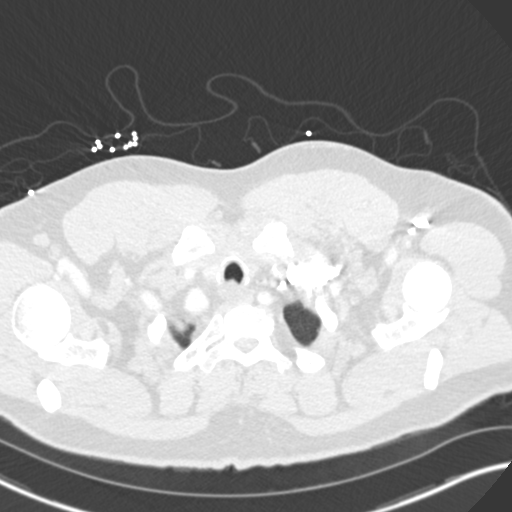
[im 265/298  mediastinal]
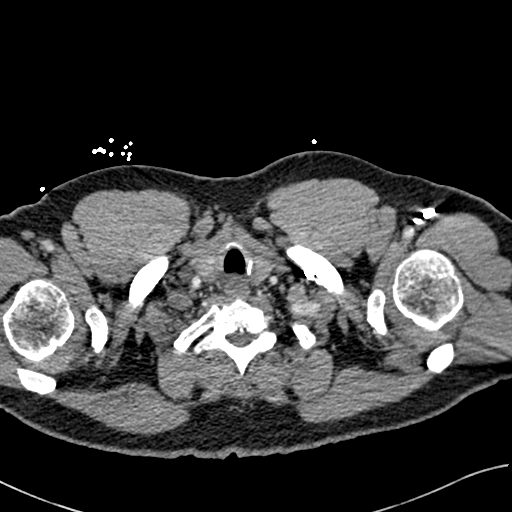
[im 281/298  lung]
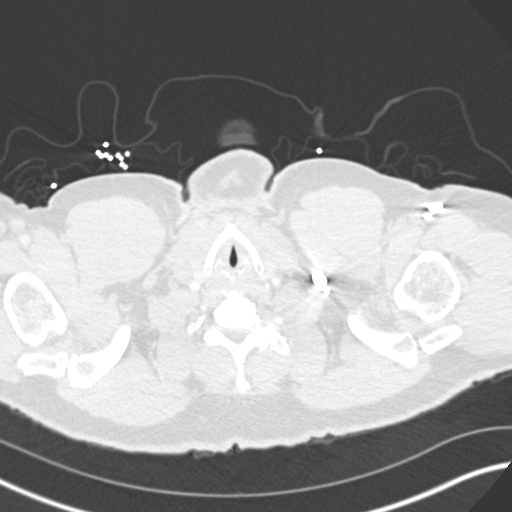

[Series 7: coronal mpr · coronal · 0.61mm/px · 1 of 159 slices shown]
[im 80/159  mediastinal]
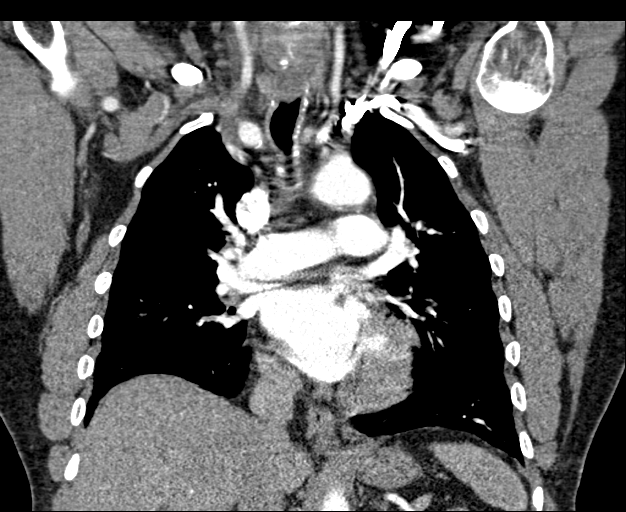

[18 of 36 positions shown; findings below may reference images not displayed]

FINDINGS: Cardiovascular: Satisfactory opacification of the pulmonary arteries
to the segmental level. No evidence of pulmonary embolism. Normal
heart size. No pericardial effusion.

Mediastinum/Nodes: No enlarged mediastinal or axillary nodes. Mildly
prominent right hilar nodes may be reactive. Thyroid is
unremarkable.

Lungs/Pleura: No pleural effusion or pneumothorax. Small calcified
granuloma the right upper lobe. Minimal patchy density bilaterally
may reflect atelectasis or infectious/inflammatory process.

Upper Abdomen: No acute abnormality.

Musculoskeletal: No acute abnormality.

Review of the MIP images confirms the above findings.
IMPRESSION: No evidence of acute pulmonary embolism.

Minimal patchy density may reflect atelectasis or
infectious/inflammatory process.

## 2019-10-02 MED ORDER — ACETAMINOPHEN 500 MG PO TABS
1000.0000 mg | ORAL_TABLET | Freq: Once | ORAL | Status: AC
  Administered 2019-10-02: 1000 mg via ORAL
  Filled 2019-10-02: qty 2

## 2019-10-02 MED ORDER — ALBUTEROL SULFATE HFA 108 (90 BASE) MCG/ACT IN AERS: 2.0000 | INHALATION_SPRAY | Freq: Once | RESPIRATORY_TRACT | Status: DC | PRN

## 2019-10-02 MED ORDER — DIPHENHYDRAMINE HCL 50 MG/ML IJ SOLN: 50.0000 mg | Freq: Once | INTRAMUSCULAR | Status: DC | PRN

## 2019-10-02 MED ORDER — SODIUM CHLORIDE 0.9 % IV SOLN
1200.0000 mg | Freq: Once | INTRAVENOUS | Status: AC
  Administered 2019-10-02: 1200 mg via INTRAVENOUS
  Filled 2019-10-02: qty 10

## 2019-10-02 MED ORDER — SODIUM CHLORIDE 0.9 % IV SOLN: INTRAVENOUS | Status: DC | PRN

## 2019-10-02 MED ORDER — METHYLPREDNISOLONE SODIUM SUCC 125 MG IJ SOLR: 125.0000 mg | Freq: Once | INTRAMUSCULAR | Status: DC | PRN

## 2019-10-02 MED ORDER — EPINEPHRINE 0.3 MG/0.3ML IJ SOAJ: 0.3000 mg | Freq: Once | INTRAMUSCULAR | Status: DC | PRN

## 2019-10-02 MED ORDER — IOHEXOL 350 MG/ML SOLN
100.0000 mL | Freq: Once | INTRAVENOUS | Status: AC | PRN
  Administered 2019-10-02: 77 mL via INTRAVENOUS

## 2019-10-02 MED ORDER — FAMOTIDINE IN NACL 20-0.9 MG/50ML-% IV SOLN: 20.0000 mg | Freq: Once | INTRAVENOUS | Status: DC | PRN

## 2019-10-02 NOTE — Discharge Instructions (Signed)

## 2019-10-02 NOTE — Discharge Instructions (Addendum)
Go to the antibiotic infusion clinic as discussed.  Follow-up for virtual recheck with primary doctor later this week.  If you develop difficulty breathing, vomiting or other new concerning symptom, return to ER for reassessment.

## 2019-10-02 NOTE — Progress Notes (Signed)
I connected by phone with Lance Goltz Sr. on 10/02/2019 at 11:19 AM to discuss the potential use of a new treatment for mild to moderate COVID-19 viral infection in non-hospitalized patients.  This patient is a 64 y.o. male that meets the FDA criteria for Emergency Use Authorization of COVID monoclonal antibody casirivimab/imdevimab.  Has a (+) direct SARS-CoV-2 viral test result  Has mild or moderate COVID-19   Is NOT hospitalized due to COVID-19  Is within 10 days of symptom onset  Has at least one of the high risk factor(s) for progression to severe COVID-19 and/or hospitalization as defined in EUA.  Specific high risk criteria : Older age (>/= 65 yo), BMI > 25 and Cardiovascular disease or hypertension Sx onset 09/25/2019  I have spoken and communicated the following to the patient or parent/caregiver regarding COVID monoclonal antibody treatment:  1. FDA has authorized the emergency use for the treatment of mild to moderate COVID-19 in adults and pediatric patients with positive results of direct SARS-CoV-2 viral testing who are 36 years of age and older weighing at least 40 kg, and who are at high risk for progressing to severe COVID-19 and/or hospitalization.  2. The significant known and potential risks and benefits of COVID monoclonal antibody, and the extent to which such potential risks and benefits are unknown.  3. Information on available alternative treatments and the risks and benefits of those alternatives, including clinical trials.  4. Patients treated with COVID monoclonal antibody should continue to self-isolate and use infection control measures (e.g., wear mask, isolate, social distance, avoid sharing personal items, clean and disinfect "high touch" surfaces, and frequent handwashing) according to CDC guidelines.   5. The patient or parent/caregiver has the option to accept or refuse COVID monoclonal antibody treatment.  After reviewing this information with the  patient, The patient agreed to proceed with receiving casirivimab\imdevimab infusion and will be provided a copy of the Fact sheet prior to receiving the infusion. Scot Dock 10/02/2019 11:19 AM

## 2019-10-02 NOTE — ED Notes (Signed)
Save dark green and blue save in main lab

## 2019-10-02 NOTE — ED Triage Notes (Signed)
Patient arrived stating he has shortness of breath, chest tightness, and swollen lymph nodes over the last week. Patient has been vaccinated for covid-19 reports his wife at home is covid-19 positive.

## 2019-10-02 NOTE — Progress Notes (Signed)
  Diagnosis: COVID-19  Physician: Dr. Patrick Wright  Procedure: Covid Infusion Clinic Med: casirivimab\imdevimab infusion - Provided patient with casirivimab\imdevimab fact sheet for patients, parents and caregivers prior to infusion.  Complications: No immediate complications noted.  Discharge: Discharged home   [name] 10/02/2019   

## 2019-10-02 NOTE — ED Provider Notes (Signed)
[facility] DEPT Provider Note   CSN: [id number] Arrival date & time: 10/02/19  0130     History Chief Complaint  Patient presents with  . Shortness of Breath    [name]. is a 65 y.o. male.  Presents with shortness of breath, chest tightness.  Also having cough.  Cough is nonproductive, nonbloody.  Shortness of breath worse with exertion.  Had some intermittent mild chest tightness over the weekend but no chest pain today.  Wife diagnosed with Covid.  He reports being vaccinated with Mattern vaccine in May.  No fevers.  He denies any medical problems but does endorse history of smoking. HPI     Past Medical History:  Diagnosis Date  . Dyslipidemia   . History of gout   . Hypertension   . Normal coronary arteries 12/2007    Patient Active Problem List   Diagnosis Date Noted  . Near syncope 03/06/2017  . Elevated troponin 03/06/2017  . Hyperglycemia 03/06/2017  . Hypertension   . Dyslipidemia   . History of gout   . Normal coronary arteries 12/26/2007    Past Surgical History:  Procedure Laterality Date  . CARDIAC CATHETERIZATION  12/2007   normal coronaries       Family History  Problem Relation Age of Onset  . Colon cancer Maternal Uncle   . Colon cancer Cousin   . Dementia Mother   . Alcohol abuse Father     Social History   Tobacco Use  . Smoking status: Current Every Day Smoker    Packs/day: 1.00    Types: Cigarettes  . Smokeless tobacco: Never Used  Vaping Use  . Vaping Use: Never used  Substance Use Topics  . Alcohol use: No  . Drug use: No    Home Medications Prior to Admission medications   Medication Sig Start Date End Date Taking? Authorizing Provider  allopurinol (ZYLOPRIM) 100 MG tablet Take 100 mg by mouth daily. 09/03/19  Yes [provider]  aspirin EC 81 MG EC tablet Take 1 tablet (81 mg total) by mouth daily. 03/08/17  Yes Hall, Lorenda Cahill, DO  lisinopril-hydrochlorothiazide  (PRINZIDE,ZESTORETIC) 20-12.5 MG per tablet Take 1 tablet by mouth Daily. 09/30/11  Yes [provider]  meloxicam (MOBIC) 15 MG tablet Take 15 mg by mouth daily.   Yes [provider]  Multiple Vitamins-Minerals (MULTIVITAMIN WITH MINERALS) tablet Take 1 tablet by mouth daily.   Yes [provider]  atorvastatin (LIPITOR) 40 MG tablet Take 1 tablet (40 mg total) by mouth daily at 6 PM. Patient not taking: Reported on 01/29/2018 03/07/17   Kayleen Memos, DO  diclofenac sodium (VOLTAREN) 1 % GEL Apply 2 g topically 4 (four) times daily. Patient not taking: Reported on 03/06/2017 09/29/16   Ok Edwards, PA-C  HYDROcodone-acetaminophen (NORCO/VICODIN) 5-325 MG tablet Take 1 tablet by mouth every 6 (six) hours as needed for severe pain. Patient not taking: Reported on 01/29/2018 12/18/17   Rodell Perna A, PA-C  lidocaine (LIDODERM) 5 % Place 1 patch onto the skin daily. Remove & Discard patch within 12 hours or as directed by MD Patient not taking: Reported on 10/02/2019 01/29/18   Joy, Helane Gunther, PA-C  methocarbamol (ROBAXIN) 500 MG tablet Take 1 tablet (500 mg total) by mouth 2 (two) times daily. Patient not taking: Reported on 10/02/2019 01/29/18   Joy, Raquel Sarna C, PA-C  ondansetron (ZOFRAN) 4 MG tablet Take 1 tablet (4 mg total) by mouth every 6 (  six) hours. Patient not taking: Reported on 03/06/2017 07/21/16   Montine Circle, PA-C    Allergies    Patient has no known allergies.  Review of Systems   Review of Systems  Constitutional: Positive for fatigue. Negative for chills and fever.  HENT: Negative for ear pain and sore throat.   Eyes: Negative for pain and visual disturbance.  Respiratory: Positive for cough and shortness of breath.   Cardiovascular: Positive for chest pain. Negative for palpitations.  Gastrointestinal: Negative for abdominal pain and vomiting.  Genitourinary: Negative for dysuria and hematuria.  Musculoskeletal: Negative for arthralgias and back pain.  Skin:  Negative for color change and rash.  Neurological: Negative for seizures and syncope.  All other systems reviewed and are negative.   Physical Exam Updated Vital Signs BP (!) 161/90 (BP Location: Right Arm)   Pulse 84   Temp 98.7 F (37.1 C) (Oral)   Resp 20   Ht 6' (1.829 m)   Wt 99.8 kg   SpO2 100%   BMI 29.84 kg/m   Physical Exam Vitals and nursing note reviewed.  Constitutional:      Appearance: He is well-developed.  HENT:     Head: Normocephalic and atraumatic.  Eyes:     Conjunctiva/sclera: Conjunctivae normal.  Cardiovascular:     Rate and Rhythm: Normal rate and regular rhythm.     Heart sounds: No murmur heard.   Pulmonary:     Effort: Pulmonary effort is normal. No respiratory distress.     Breath sounds: Normal breath sounds.  Abdominal:     Palpations: Abdomen is soft.     Tenderness: There is no abdominal tenderness.  Musculoskeletal:     Cervical back: Neck supple.  Skin:    General: Skin is warm and dry.     Capillary Refill: Capillary refill takes less than 2 seconds.  Neurological:     General: No focal deficit present.     Mental Status: He is alert and oriented to person, place, and time.  Psychiatric:        Mood and Affect: Mood normal.        Behavior: Behavior normal.     ED Results / Procedures / Treatments   Labs (all labs ordered are listed, but only abnormal results are displayed) Labs Reviewed  SARS CORONAVIRUS 2 BY RT PCR (HOSPITAL ORDER, [facility]) - Abnormal; Notable for the following components:      Result Value   SARS Coronavirus 2 POSITIVE (*)    All other components within normal limits  BASIC METABOLIC PANEL - Abnormal; Notable for the following components:   Glucose, Bld 141 (*)    All other components within normal limits  D-DIMER, QUANTITATIVE (NOT AT [facility]) - Abnormal; Notable for the following components:   D-Dimer, Quant 0.75 (*)    All other components within normal limits  CBC    TROPONIN I (HIGH SENSITIVITY)  TROPONIN I (HIGH SENSITIVITY)    EKG EKG Interpretation  Date/Time:  Tuesday October 02 2019 01:42:08 EDT Ventricular Rate:  108 PR Interval:    QRS Duration: 74 QT Interval:  308 QTC Calculation: 413 R Axis:   37 Text Interpretation: Sinus tachycardia Confirmed by Dory Horn) on 10/02/2019 2:31:54 AM   Radiology DG Chest 2 View  Result Date: 10/02/2019 CLINICAL DATA:  Cough and chest pain EXAM: CHEST - 2 VIEW COMPARISON:  None. FINDINGS: The heart size and mediastinal contours are within normal limits. Both lungs  are clear. The visualized skeletal structures are unremarkable. IMPRESSION: No active cardiopulmonary disease. Electronically Signed   By: Ulyses Jarred M.D.   On: 10/02/2019 02:07   CT Angio Chest PE W and/or Wo Contrast  Result Date: 10/02/2019 CLINICAL DATA:  Shortness of breath, COVID positive with history of vaccination EXAM: CT ANGIOGRAPHY CHEST WITH CONTRAST TECHNIQUE: Multidetector CT imaging of the chest was performed using the standard protocol during bolus administration of intravenous contrast. Multiplanar CT image reconstructions and MIPs were obtained to evaluate the vascular anatomy. CONTRAST:  58mL OMNIPAQUE IOHEXOL 350 MG/ML SOLN COMPARISON:  01/29/2018 FINDINGS: Cardiovascular: Satisfactory opacification of the pulmonary arteries to the segmental level. No evidence of pulmonary embolism. Normal heart size. No pericardial effusion. Mediastinum/Nodes: No enlarged mediastinal or axillary nodes. Mildly prominent right hilar nodes may be reactive. Thyroid is unremarkable. Lungs/Pleura: No pleural effusion or pneumothorax. Small calcified granuloma the right upper lobe. Minimal patchy density bilaterally may reflect atelectasis or infectious/inflammatory process. Upper Abdomen: No acute abnormality. Musculoskeletal: No acute abnormality. Review of the MIP images confirms the above findings. IMPRESSION: No evidence of acute  pulmonary embolism. Minimal patchy density may reflect atelectasis or infectious/inflammatory process. Electronically Signed   By: Macy Mis M.D.   On: 10/02/2019 13:40    Procedures Procedures (including critical care time)  Medications Ordered in ED Medications  acetaminophen (TYLENOL) tablet 1,000 mg (1,000 mg Oral Given 10/02/19 1048)  iohexol (OMNIPAQUE) 350 MG/ML injection 100 mL (77 mLs Intravenous Contrast Given 10/02/19 1313)    ED Course  I have reviewed the triage vital signs and the nursing notes.  Pertinent labs & imaging results that were available during my care of the patient were reviewed by me and considered in my medical decision making (see chart for details).  Clinical Course as of Oct 02 1351  Tue Oct 02, 2019  1110 Sent message to antibody infusion clinic   [RD]    Clinical Course User Index [RD] Lucrezia Starch, MD   MDM Rules/Calculators/A&P                         64 year old male presents to ER with concern for Covid exposure, cough and shortness of breath and occasional chest pain.  On exam patient is well-appearing in no distress.  CXR negative.  Labs grossly stable.  EKG without obvious ischemic change, troponin x2 within normal limits, doubt ACS.  His Covid test was positive.  Given his exertional dyspnea, Covid, checked D-dimer to rule out pulmonary embolism.  D-dimer was mildly elevated and thus proceeded with CTA chest.  CT scan was negative for acute PE.  On reassessment patient remains well-appearing with no hypoxia.  Believe he is appropriate for discharge and outpatient management at this time.  Coordinated with antibody infusion clinic, patient will go directly from ER to their clinic.    After the discussed management above, the patient was determined to be safe for discharge.  The patient was in agreement with this plan and all questions regarding their care were answered.  ED return precautions were discussed and the patient will return to  the ED with any significant worsening of condition.    Lance Goltz Sr. was evaluated in Emergency Department on 10/02/2019 for the symptoms described in the history of present illness. He was evaluated in the context of the global COVID-19 pandemic, which necessitated consideration that the patient might be at risk for infection with the SARS-CoV-2 virus that causes COVID-19.  Institutional protocols and algorithms that pertain to the evaluation of patients at risk for COVID-19 are in a state of rapid change based on information released by regulatory bodies including the CDC and federal and state organizations. These policies and algorithms were followed during the patient's care in the ED.    Final Clinical Impression(s) / ED Diagnoses Final diagnoses:  Shortness of breath  COVID-19    Rx / DC Orders ED Discharge Orders    None       Lucrezia Starch, MD 10/02/19 1353

## 2019-10-02 NOTE — ED Notes (Signed)
ED Provider at bedside. 

## 2019-12-18 ENCOUNTER — Emergency Department (HOSPITAL_COMMUNITY): Payer: 59

## 2019-12-18 ENCOUNTER — Other Ambulatory Visit: Payer: Self-pay

## 2019-12-18 ENCOUNTER — Emergency Department (HOSPITAL_COMMUNITY)
Admission: EM | Admit: 2019-12-18 | Discharge: 2019-12-18 | Disposition: A | Discharge status: Home / Self Care | Payer: 59 | Attending: Emergency Medicine | Admitting: Emergency Medicine

## 2019-12-18 DIAGNOSIS — Z79899 Other long term (current) drug therapy: Secondary | ICD-10-CM | POA: Diagnosis not present

## 2019-12-18 DIAGNOSIS — R519 Headache, unspecified: Secondary | ICD-10-CM | POA: Insufficient documentation

## 2019-12-18 DIAGNOSIS — F17210 Nicotine dependence, cigarettes, uncomplicated: Secondary | ICD-10-CM | POA: Diagnosis not present

## 2019-12-18 DIAGNOSIS — Z7982 Long term (current) use of aspirin: Secondary | ICD-10-CM | POA: Diagnosis not present

## 2019-12-18 DIAGNOSIS — I10 Essential (primary) hypertension: Secondary | ICD-10-CM | POA: Insufficient documentation

## 2019-12-18 DIAGNOSIS — R0602 Shortness of breath: Secondary | ICD-10-CM | POA: Diagnosis not present

## 2019-12-18 DIAGNOSIS — R55 Syncope and collapse: Secondary | ICD-10-CM | POA: Diagnosis not present

## 2019-12-18 LAB — BASIC METABOLIC PANEL
Anion gap: 7 (ref 5–15)
BUN: 13 mg/dL (ref 8–23)
CO2: 26 mmol/L (ref 22–32)
Calcium: 8.9 mg/dL (ref 8.9–10.3)
Chloride: 106 mmol/L (ref 98–111)
Creatinine, Ser: 1.25 mg/dL — ABNORMAL HIGH (ref 0.61–1.24)
GFR, Estimated: >60 mL/min (ref >60)
Glucose, Bld: 115 mg/dL — ABNORMAL HIGH (ref 70–99)
Potassium: 3.7 mmol/L (ref 3.5–5.1)
Sodium: 139 mmol/L (ref 135–145)

## 2019-12-18 LAB — CBC WITH DIFFERENTIAL/PLATELET
Abs Immature Granulocytes: 0.03 10*3/uL (ref 0.00–0.07)
Basophils Absolute: 0.1 10*3/uL (ref 0.0–0.1)
Basophils Relative: 0 %
Eosinophils Absolute: 0.3 10*3/uL (ref 0.0–0.5)
Eosinophils Relative: 2 %
HCT: 37.5 % — ABNORMAL LOW (ref 39.0–52.0)
Hemoglobin: 12.4 g/dL — ABNORMAL LOW (ref 13.0–17.0)
Immature Granulocytes: 0 %
Lymphocytes Relative: 25 %
Lymphs Abs: 3.4 10*3/uL (ref 0.7–4.0)
MCH: 30.8 pg (ref 26.0–34.0)
MCHC: 33.1 g/dL (ref 30.0–36.0)
MCV: 93.3 fL (ref 80.0–100.0)
Monocytes Absolute: 0.9 10*3/uL (ref 0.1–1.0)
Monocytes Relative: 6 %
Neutro Abs: 8.7 10*3/uL — ABNORMAL HIGH (ref 1.7–7.7)
Neutrophils Relative %: 67 %
Platelets: 235 10*3/uL (ref 150–400)
RBC: 4.02 MIL/uL — ABNORMAL LOW (ref 4.22–5.81)
RDW: 13.9 % (ref 11.5–15.5)
WBC: 13.3 10*3/uL — ABNORMAL HIGH (ref 4.0–10.5)
nRBC: 0 % (ref 0.0–0.2)

## 2019-12-18 LAB — URINALYSIS, ROUTINE W REFLEX MICROSCOPIC
Bilirubin Urine: NEGATIVE
Glucose, UA: NEGATIVE mg/dL
Hgb urine dipstick: NEGATIVE
Ketones, ur: 5 mg/dL — AB
Nitrite: NEGATIVE
Protein, ur: 100 mg/dL — AB
Specific Gravity, Urine: 1.025 (ref 1.005–1.030)
pH: 6 (ref 5.0–8.0)

## 2019-12-18 LAB — HEPATIC FUNCTION PANEL
ALT: 18 U/L (ref 0–44)
AST: 22 U/L (ref 15–41)
Albumin: 3.7 g/dL (ref 3.5–5.0)
Alkaline Phosphatase: 68 U/L (ref 38–126)
Bilirubin, Direct: <0.1 mg/dL (ref 0.0–0.2)
Total Bilirubin: 0.3 mg/dL (ref 0.3–1.2)
Total Protein: 7.4 g/dL (ref 6.5–8.1)

## 2019-12-18 IMAGING — DX DG CHEST 1V PORT
1 series · 1 of 1 positions shown · non-contrast
Comparison: CT angiogram chest [DATE]. Chest radiographs
[DATE].

CLINICAL DATA: Dizziness, near syncope. Additional history
provided: Dizziness and shortness of breath this morning.

EXAM:
PORTABLE CHEST 1 VIEW

[chest ap]
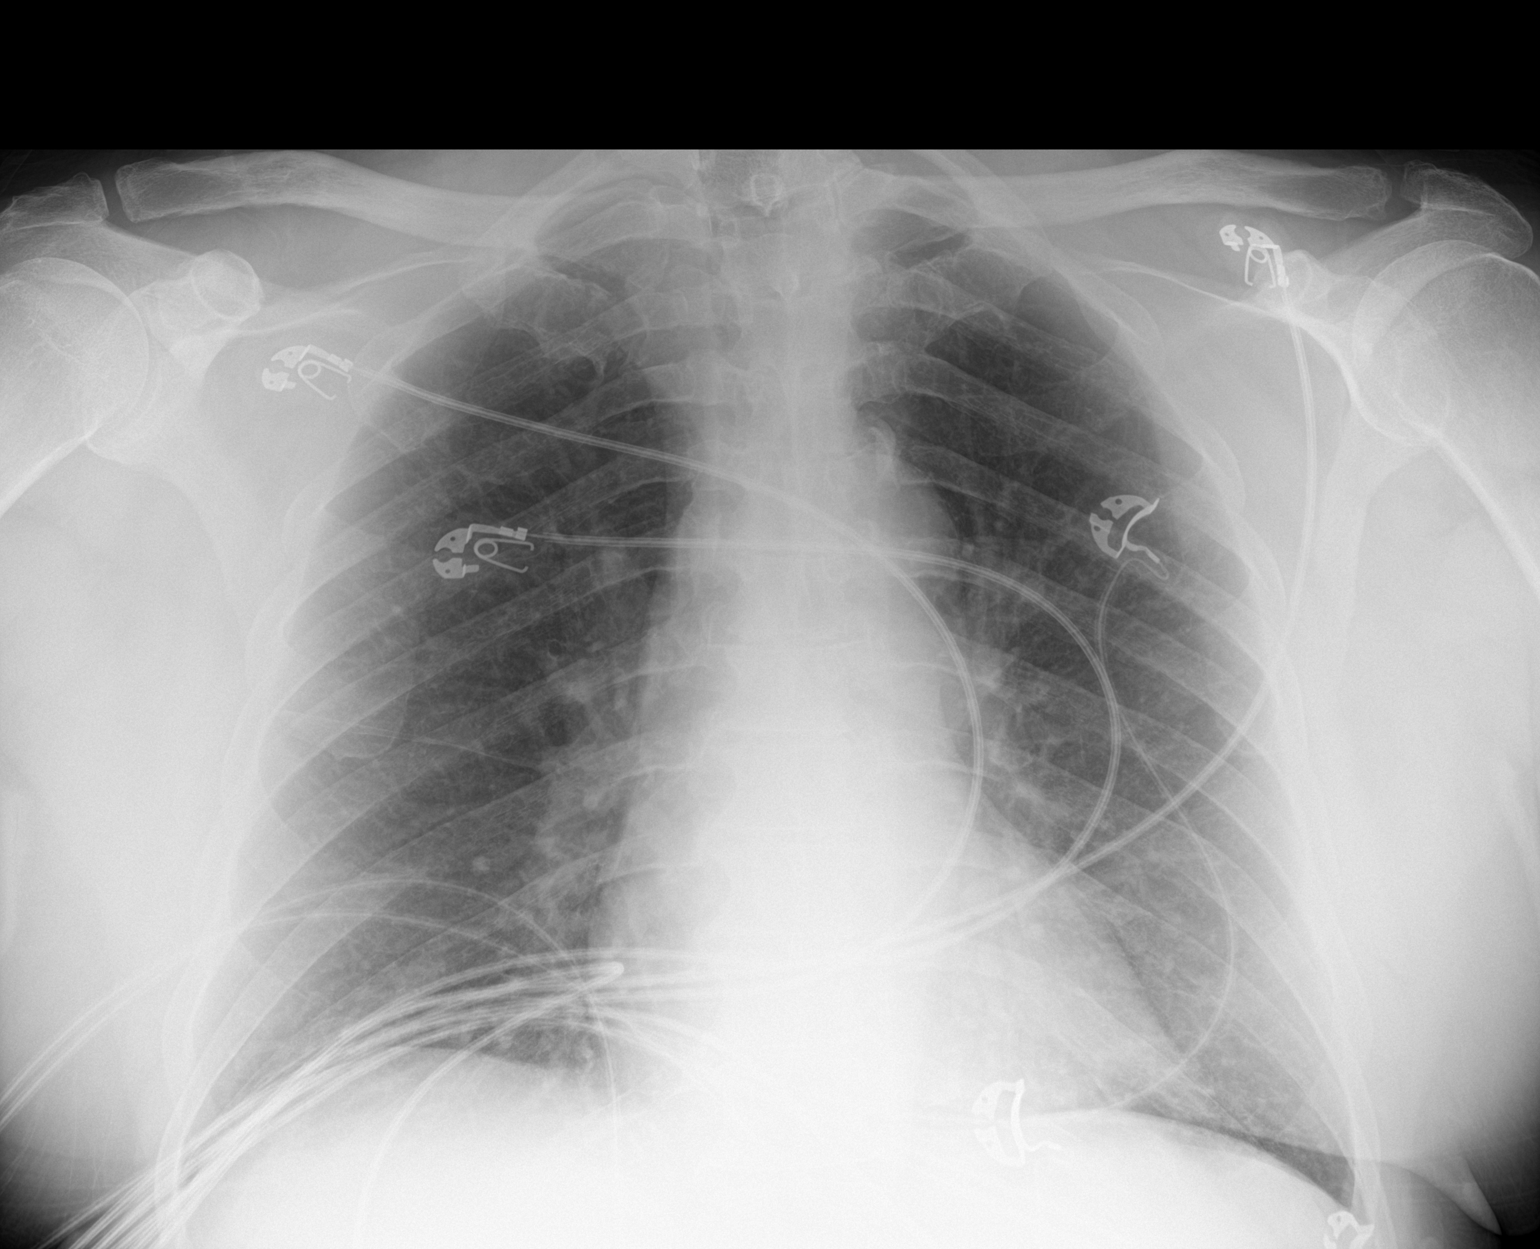

[1 of 1 positions shown; findings below may reference images not displayed]

FINDINGS: Heart size within normal limits. No appreciable airspace
consolidation or pulmonary edema. No evidence of pleural effusion or
pneumothorax. No acute bony abnormality identified.
IMPRESSION: No evidence of acute cardiopulmonary abnormality.

## 2019-12-18 IMAGING — CT CT HEAD W/O CM
3 of 7 series · 15 of 47 positions shown, 18 images · non-contrast
Comparison: [DATE]

CLINICAL DATA: Dizziness and headaches

EXAM:
CT HEAD WITHOUT CONTRAST
TECHNIQUE: Contiguous axial images were obtained from the base of the skull
through the vertex without intravenous contrast.

[Series 2: head wo · axial · 0.47mm/px · z∈[-25,+100]mm · 12 of 31 slices shown, 15 images]
[im 3/31  brain]
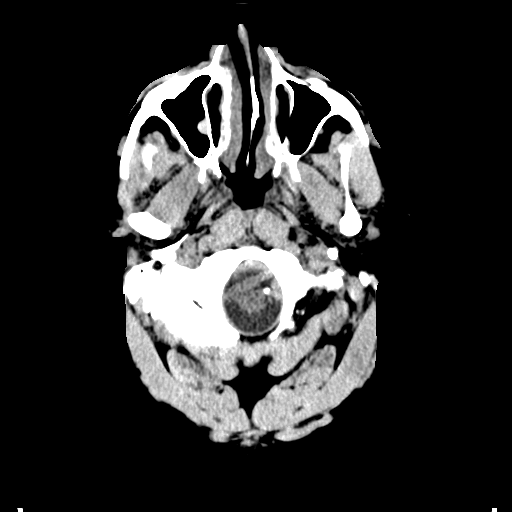
[im 3/31  bone]
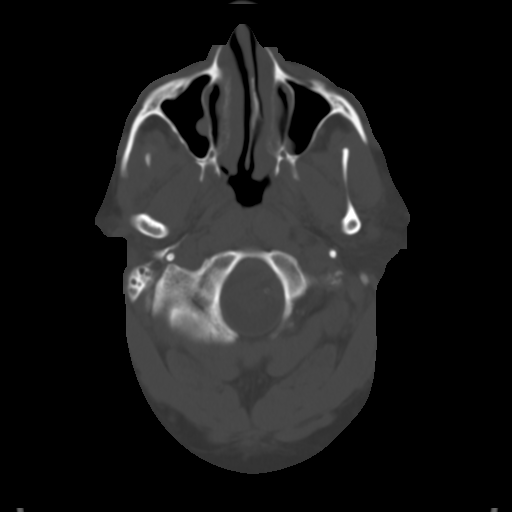
[im 5/31  brain]
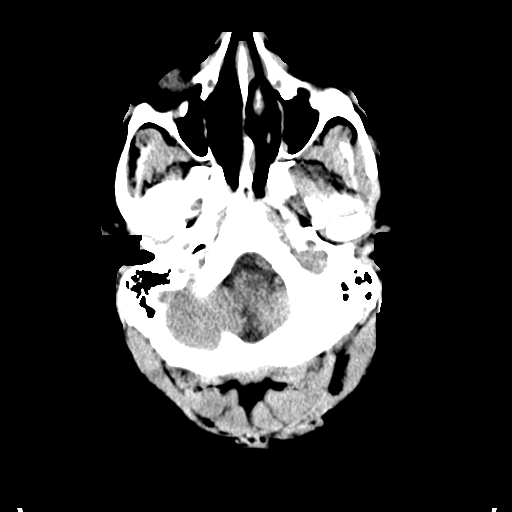
[im 7/31  brain]
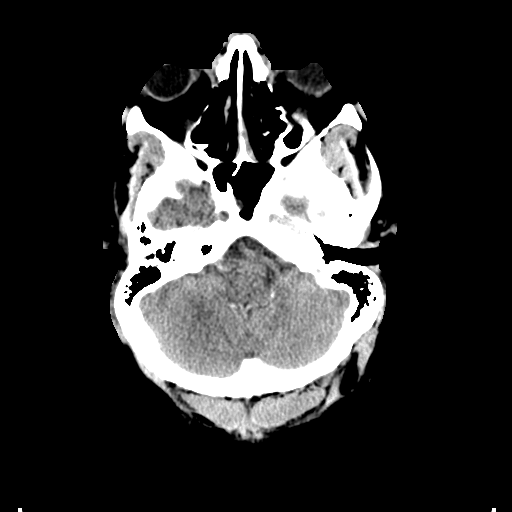
[im 10/31  brain]
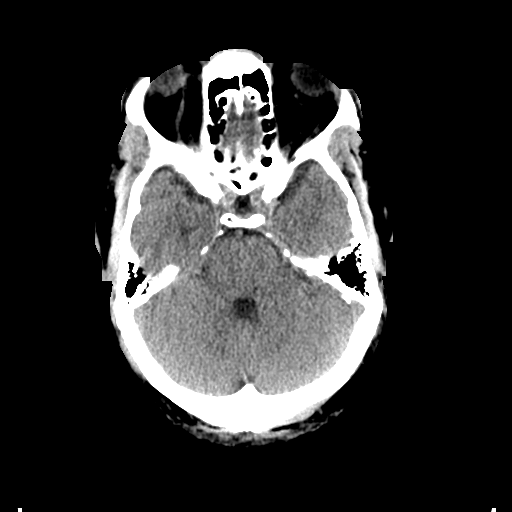
[im 12/31  brain]
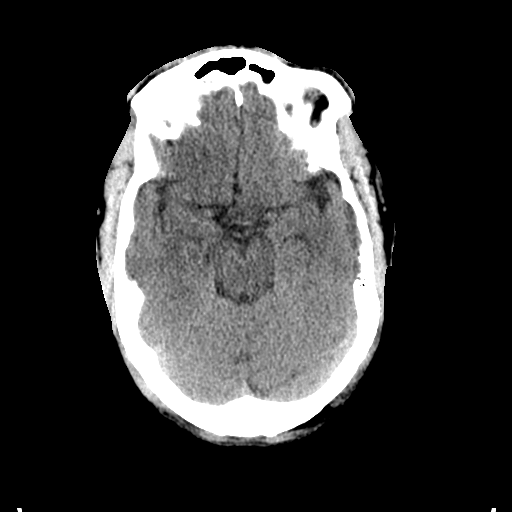
[im 12/31  bone]
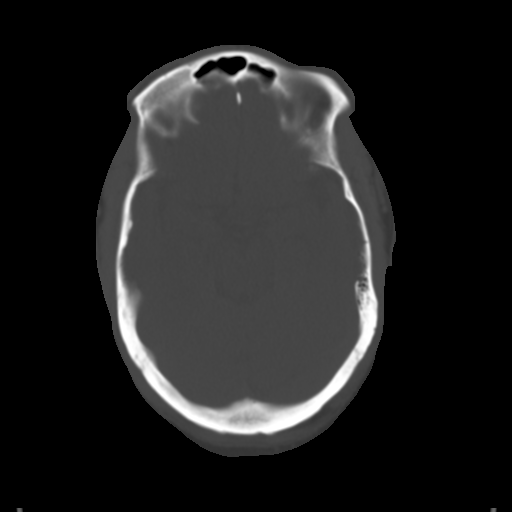
[im 14/31  brain]
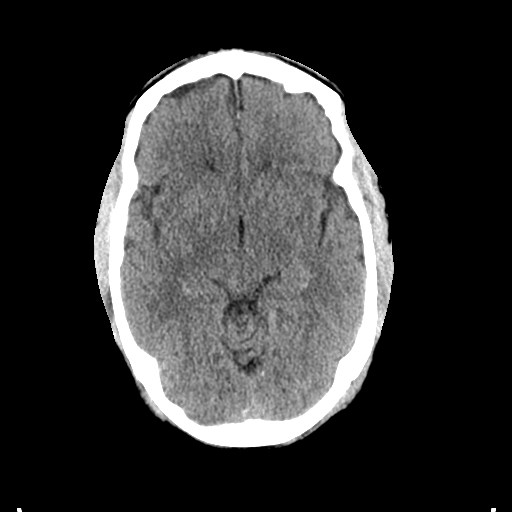
[im 17/31  brain]
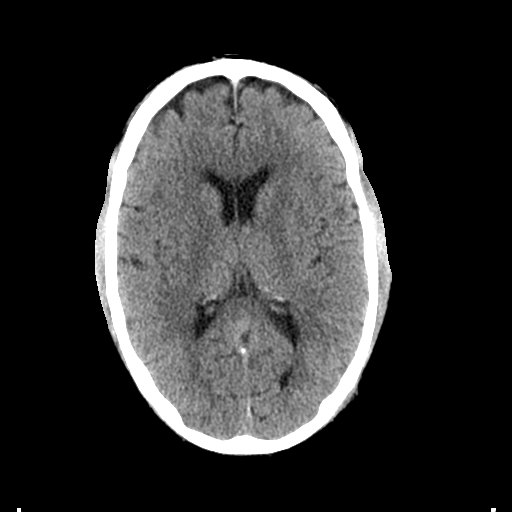
[im 19/31  brain]
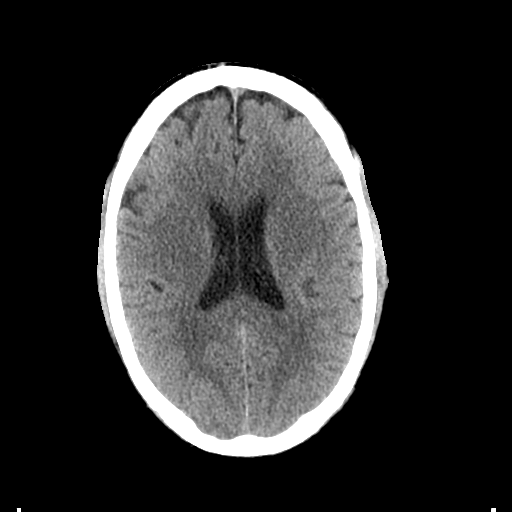
[im 21/31  brain]
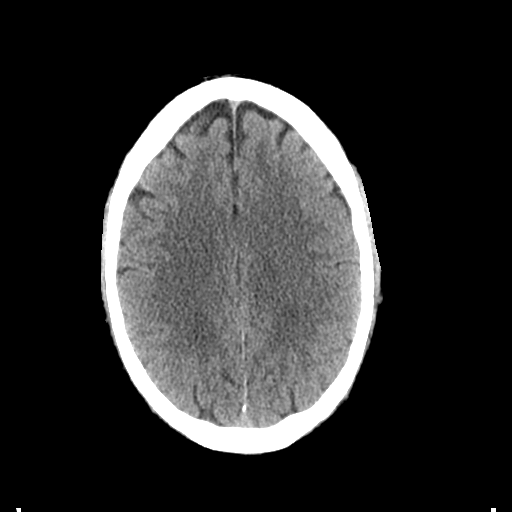
[im 21/31  bone]
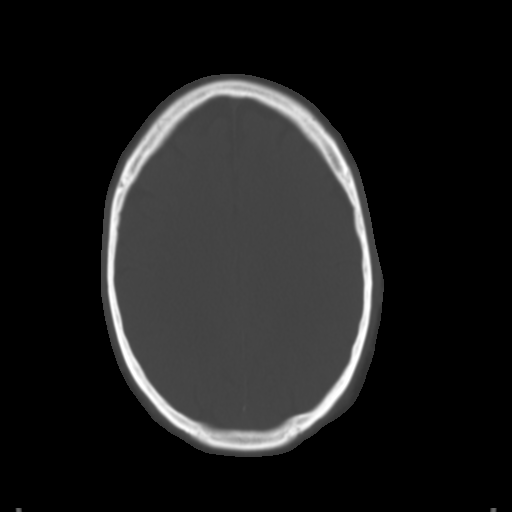
[im 24/31  brain]
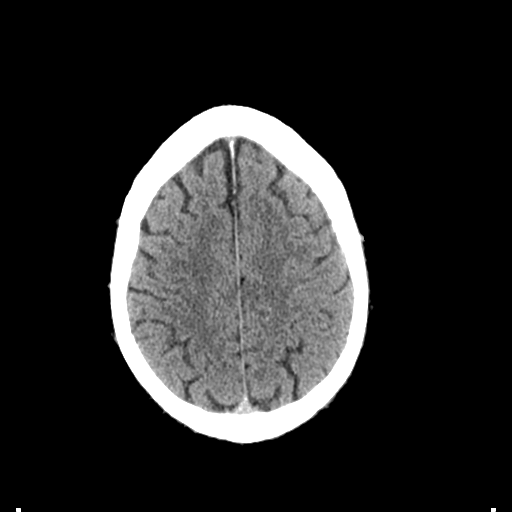
[im 26/31  brain]
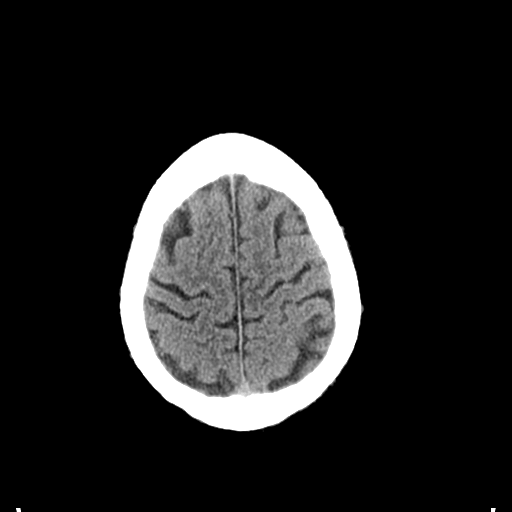
[im 28/31  brain]
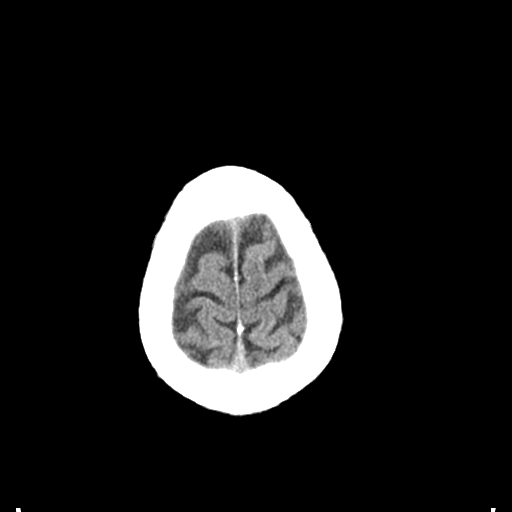

[Series 9: cor · coronal · 0.32mm/px · 2 of 106 slices shown]
[im 36/106  brain]
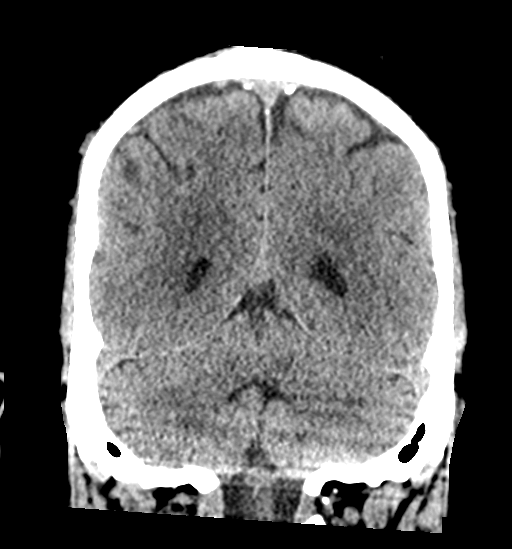
[im 71/106  brain]
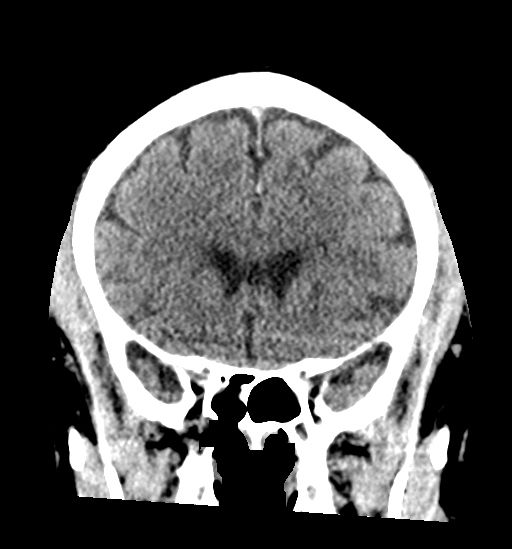

[Series 10: sag · sagittal · 0.35mm/px · 1 of 82 slices shown]
[im 41/82  brain]
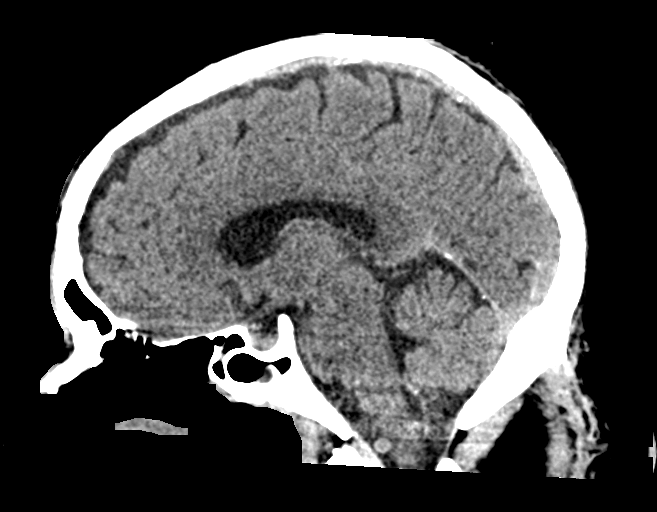

[15 of 47 positions shown; findings below may reference images not displayed]

FINDINGS: Brain: No evidence of acute infarction, hemorrhage, hydrocephalus,
extra-axial collection or mass lesion/mass effect.

Vascular: No hyperdense vessel or unexpected calcification.

Skull: Normal. Negative for fracture or focal lesion.

Sinuses/Orbits: Small mucosal retention cysts are noted in the
maxillary antra bilaterally.

Other: None
IMPRESSION: No acute intracranial abnormality noted

## 2019-12-18 MED ORDER — SODIUM CHLORIDE 0.9 % IV BOLUS
1000.0000 mL | Freq: Once | INTRAVENOUS | Status: AC
  Administered 2019-12-18: 1000 mL via INTRAVENOUS

## 2019-12-18 NOTE — Discharge Instructions (Signed)
Work-up today was normal.  No significant findings in your lab work.  No signs of infection and chest x-ray.  Head CT was normal.  Recommend hydration and rest.

## 2019-12-18 NOTE — ED Provider Notes (Signed)
[facility] DEPT Provider Note   CSN: [id number] Arrival date & time: 12/18/19  8563     History Chief Complaint  Patient presents with  . Near Syncope  . Weakness  . Shortness of Breath    [name]. is a 64 y.o. male.  The history is provided by the patient.  Near Syncope This is a new problem. The current episode started less than 1 hour ago. The problem has been resolved. Associated symptoms include headaches and shortness of breath. Pertinent negatives include no chest pain and no abdominal pain. Nothing aggravates the symptoms. Nothing relieves the symptoms. He has tried nothing for the symptoms. The treatment provided no relief.       Past Medical History:  Diagnosis Date  . Dyslipidemia   . History of gout   . Hypertension   . Normal coronary arteries 12/2007    Patient Active Problem List   Diagnosis Date Noted  . Near syncope 03/06/2017  . Elevated troponin 03/06/2017  . Hyperglycemia 03/06/2017  . Hypertension   . Dyslipidemia   . History of gout   . Normal coronary arteries 12/26/2007    Past Surgical History:  Procedure Laterality Date  . CARDIAC CATHETERIZATION  12/2007   normal coronaries       Family History  Problem Relation Age of Onset  . Colon cancer Maternal Uncle   . Colon cancer Cousin   . Dementia Mother   . Alcohol abuse Father     Social History   Tobacco Use  . Smoking status: Current Every Day Smoker    Packs/day: 1.00    Types: Cigarettes  . Smokeless tobacco: Never Used  Vaping Use  . Vaping Use: Never used  Substance Use Topics  . Alcohol use: No  . Drug use: No    Home Medications Prior to Admission medications   Medication Sig Start Date End Date Taking? Authorizing Provider  allopurinol (ZYLOPRIM) 100 MG tablet Take 100 mg by mouth daily. 09/03/19  Yes [provider]  aspirin EC 81 MG EC tablet Take 1 tablet (81 mg total) by mouth daily. 03/08/17  Yes Hall,  Lorenda Cahill, DO  lisinopril-hydrochlorothiazide (PRINZIDE,ZESTORETIC) 20-12.5 MG per tablet Take 1 tablet by mouth Daily. 09/30/11  Yes [provider]  meloxicam (MOBIC) 15 MG tablet Take 15 mg by mouth daily.   Yes [provider]  Multiple Vitamins-Minerals (MULTIVITAMIN WITH MINERALS) tablet Take 1 tablet by mouth daily.   Yes [provider]  omeprazole (PRILOSEC OTC) 20 MG tablet Take 20 mg by mouth as needed (acid reflux).   Yes [provider]  rosuvastatin (CRESTOR) 10 MG tablet Take 10 mg by mouth at bedtime. 11/23/19  Yes [provider]  atorvastatin (LIPITOR) 40 MG tablet Take 1 tablet (40 mg total) by mouth daily at 6 PM. Patient not taking: Reported on 01/29/2018 03/07/17   Kayleen Memos, DO  diclofenac sodium (VOLTAREN) 1 % GEL Apply 2 g topically 4 (four) times daily. Patient not taking: Reported on 03/06/2017 09/29/16   Ok Edwards, PA-C  HYDROcodone-acetaminophen (NORCO/VICODIN) 5-325 MG tablet Take 1 tablet by mouth every 6 (six) hours as needed for severe pain. Patient not taking: Reported on 01/29/2018 12/18/17   Rodell Perna A, PA-C  lidocaine (LIDODERM) 5 % Place 1 patch onto the skin daily. Remove & Discard patch within 12 hours or as directed by MD Patient not taking: Reported on 10/02/2019 01/29/18   Lorayne Bender,  PA-C  methocarbamol (ROBAXIN) 500 MG tablet Take 1 tablet (500 mg total) by mouth 2 (two) times daily. Patient not taking: Reported on 10/02/2019 01/29/18   Joy, Shawn C, PA-C  ondansetron (ZOFRAN) 4 MG tablet Take 1 tablet (4 mg total) by mouth every 6 (six) hours. Patient not taking: Reported on 03/06/2017 07/21/16   Montine Circle, PA-C    Allergies    Patient has no known allergies.  Review of Systems   Review of Systems  Constitutional: Negative for chills and fever.  HENT: Negative for ear pain and sore throat.   Eyes: Negative for pain and visual disturbance.  Respiratory: Positive for shortness of breath. Negative for  cough.   Cardiovascular: Positive for near-syncope. Negative for chest pain and palpitations.  Gastrointestinal: Negative for abdominal pain and vomiting.  Genitourinary: Negative for dysuria and hematuria.  Musculoskeletal: Negative for arthralgias and back pain.  Skin: Negative for color change and rash.  Neurological: Positive for dizziness, light-headedness and headaches. Negative for seizures and syncope.  All other systems reviewed and are negative.   Physical Exam Updated Vital Signs BP 132/69   Pulse 74   Temp 98.7 F (37.1 C) (Oral)   Resp (!) 22   Ht 6' (1.829 m)   Wt 101.2 kg   SpO2 95%   BMI 30.24 kg/m   Physical Exam Vitals and nursing note reviewed.  Constitutional:      General: He is not in acute distress.    Appearance: He is well-developed. He is not ill-appearing.  HENT:     Head: Normocephalic and atraumatic.  Eyes:     Conjunctiva/sclera: Conjunctivae normal.     Pupils: Pupils are equal, round, and reactive to light.  Cardiovascular:     Rate and Rhythm: Normal rate and regular rhythm.     Pulses: Normal pulses.     Heart sounds: Normal heart sounds. No murmur heard.   Pulmonary:     Effort: Pulmonary effort is normal. No respiratory distress.     Breath sounds: Normal breath sounds. No decreased breath sounds or wheezing.  Abdominal:     Palpations: Abdomen is soft.     Tenderness: There is no abdominal tenderness.  Musculoskeletal:     Cervical back: Normal range of motion and neck supple.     Right lower leg: No edema.     Left lower leg: No edema.  Skin:    General: Skin is warm and dry.     Capillary Refill: Capillary refill takes less than 2 seconds.  Neurological:     General: No focal deficit present.     Mental Status: He is alert and oriented to person, place, and time.     Cranial Nerves: No cranial nerve deficit.     Motor: No weakness.     Comments: 5+ out of 5 strength throughout, normal sensation, no drift, normal  finger-nose-finger, normal speech, no visual field deficit  Psychiatric:        Mood and Affect: Mood normal.     ED Results / Procedures / Treatments   Labs (all labs ordered are listed, but only abnormal results are displayed) Labs Reviewed  CBC WITH DIFFERENTIAL/PLATELET - Abnormal; Notable for the following components:      Result Value   WBC 13.3 (*)    RBC 4.02 (*)    Hemoglobin 12.4 (*)    HCT 37.5 (*)    Neutro Abs 8.7 (*)    All other components within normal limits  BASIC METABOLIC PANEL - Abnormal; Notable for the following components:   Glucose, Bld 115 (*)    Creatinine, Ser 1.25 (*)    All other components within normal limits  URINALYSIS, ROUTINE W REFLEX MICROSCOPIC - Abnormal; Notable for the following components:   Ketones, ur 5 (*)    Protein, ur 100 (*)    Leukocytes,Ua TRACE (*)    Bacteria, UA RARE (*)    All other components within normal limits  HEPATIC FUNCTION PANEL    EKG EKG Interpretation  Date/Time:  Tuesday December 18 2019 07:48:35 EST Ventricular Rate:  80 PR Interval:    QRS Duration: 78 QT Interval:  370 QTC Calculation: 427 R Axis:   31 Text Interpretation: Sinus rhythm No significant change since last tracing Confirmed by Lennice Sites [phone]) on 12/18/2019 7:55:28 AM   Radiology No results found.  Procedures Procedures (including critical care time)  Medications Ordered in ED Medications  sodium chloride 0.9 % bolus 1,000 mL (1,000 mLs Intravenous New Bag/Given 12/18/19 0810)    ED Course  I have reviewed the triage vital signs and the nursing notes.  Pertinent labs & imaging results that were available during my care of the patient were reviewed by me and considered in my medical decision making (see chart for details).    MDM Rules/Calculators/A&P                          MEHUL RUDIN Sr. is a 64 year old male with history of CAD who presents to the ED after near syncopal event.  Patient had just gotten home  from work this morning and was lying in bed before trying to fall asleep when he stood up to go to the bathroom.  When he stood up he got dizzy and lightheaded and had to sit back down.  He did not lose consciousness.  He did not think that he hit his head.  He is not on blood thinners.  He felt short of breath and lightheaded during this event but symptoms have now resolved except for mild headache.  Neurologically he appears intact.  Does not appear to have any strokelike symptoms on exam.  Denies any chest pain, shortness of breath, abdominal pain.  Sounds like orthostatic/vasovagal vagal type event.  EKG and blood sugar with EMS appears unremarkable.  Will recheck blood work including head CT, chest x-ray, EKG.  Will give fluid bolus.  Patient with no significant leukocytosis, anemia, electrolyte abnormality.  Kidney function at baseline.  Head CT unremarkable.  Chest x-ray without signs of infection.  Overall suspect some mild dehydration.  Recommend rest and hydration discharged in the ED in good condition.  This chart was dictated using voice recognition software.  Despite best efforts to proofread,  errors can occur which can change the documentation meaning.     Final Clinical Impression(s) / ED Diagnoses Final diagnoses:  Syncope, unspecified syncope type  Near syncope    Rx / DC Orders ED Discharge Orders    None       Lennice Sites, DO 12/18/19 1008

## 2019-12-18 NOTE — ED Notes (Signed)
Patient denies pain and is resting comfortably.  

## 2019-12-18 NOTE — ED Triage Notes (Signed)
Patient BIB [facility] from home c/o sudden onset of dizziness, SOB and weakness after coming home from work.  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  Denies any LOC or syncope.    Vitals 106-CBG 134/77 80-HR 96% RA 98.2 temp 18 g left AC

## 2020-10-01 ENCOUNTER — Emergency Department (HOSPITAL_COMMUNITY)
Admission: EM | Admit: 2020-10-01 | Discharge: 2020-10-01 | Disposition: A | Discharge status: Home / Self Care | Payer: 59 | Attending: Emergency Medicine | Admitting: Emergency Medicine

## 2020-10-01 ENCOUNTER — Other Ambulatory Visit: Payer: Self-pay

## 2020-10-01 DIAGNOSIS — T50995A Adverse effect of other drugs, medicaments and biological substances, initial encounter: Secondary | ICD-10-CM | POA: Insufficient documentation

## 2020-10-01 DIAGNOSIS — F17210 Nicotine dependence, cigarettes, uncomplicated: Secondary | ICD-10-CM | POA: Insufficient documentation

## 2020-10-01 DIAGNOSIS — T444X6A Underdosing of predominantly alpha-adrenoreceptor agonists, initial encounter: Secondary | ICD-10-CM | POA: Diagnosis not present

## 2020-10-01 DIAGNOSIS — I10 Essential (primary) hypertension: Secondary | ICD-10-CM | POA: Insufficient documentation

## 2020-10-01 DIAGNOSIS — Z79899 Other long term (current) drug therapy: Secondary | ICD-10-CM | POA: Diagnosis not present

## 2020-10-01 DIAGNOSIS — Z7982 Long term (current) use of aspirin: Secondary | ICD-10-CM | POA: Diagnosis not present

## 2020-10-01 DIAGNOSIS — N4833 Priapism, drug-induced: Secondary | ICD-10-CM | POA: Insufficient documentation

## 2020-10-01 DIAGNOSIS — N4832 Priapism due to disease classified elsewhere: Secondary | ICD-10-CM | POA: Diagnosis not present

## 2020-10-01 DIAGNOSIS — N4830 Priapism, unspecified: Secondary | ICD-10-CM

## 2020-10-01 DIAGNOSIS — N529 Male erectile dysfunction, unspecified: Secondary | ICD-10-CM | POA: Diagnosis present

## 2020-10-01 MED ORDER — PHENYLEPHRINE 200 MCG/ML FOR PRIAPISM / HYPOTENSION
100.0000 ug | Freq: Once | INTRAMUSCULAR | Status: AC
  Administered 2020-10-01: 100 ug via INTRACAVERNOUS
  Filled 2020-10-01: qty 50

## 2020-10-01 NOTE — ED Provider Notes (Signed)
Emergency Medicine Provider Triage Evaluation Note  Lance Goltz Sr. , a 65 y.o. male  was evaluated in triage.  Pt complains of persistent erection for more than 5 hours.  Patient reports [facility] and has been prescribed a medication that he injects into his penis in order to have an erection, in the past he has had to inject phenylephrine to resolve his erection but he reports he is out of this and has now had a persistent erection for 5 hours that is becoming painful.  He has been able to urinate some.  Review of Systems  Positive: Priapism, penile pain Negative: Abdominal pain, dysuria  Physical Exam  BP (!) 175/95 (BP Location: Right Arm)   Pulse 72   Temp 98 F (36.7 C) (Oral)   Resp 16   SpO2 100%  Gen:   Awake, no distress   Resp:  Normal effort  MSK:   Moves extremities without difficulty  Other:    Medical Decision Making  Medically screening exam initiated at 2:55 AM.  Appropriate orders placed.  [place] was informed that the remainder of the evaluation will be completed by another provider, this initial triage assessment does not replace that evaluation, and the importance of remaining in the ED until their evaluation is complete.  Notified triage nurse that patient will need room for emergent treatment of priapism   Janet Berlin 10/01/20 0257    Quintella Reichert, MD 10/01/20 0400

## 2020-10-01 NOTE — ED Provider Notes (Signed)
[facility] EMERGENCY DEPARTMENT Provider Note   CSN: [id number] Arrival date & time: 10/01/20  [id number]     History Chief Complaint  Patient presents with   Erectile Dysfunction    Lance Chea. is a 65 y.o. male.  The history is provided by the patient.  Erectile Dysfunction  Lance Babayan. is a 65 y.o. male who presents to the Emergency Department complaining of priapism. He presents the emergency department for evaluation of painful erection that started around 930 this evening. He goes to [facility] in [place] and injected tri-mix earlier this evening. Since that time he has experienced a painful erection. He did have this happened once before and injected phenylephrine, which was prescribed. He had used all of his phenylephrine on his prior priapism and did not have any additional remaining self treat this priapism. He has a history of hypertension. He denies any additional symptoms or illnesses. Symptoms are severe and constant in nature.    Past Medical History:  Diagnosis Date   Dyslipidemia    History of gout    Hypertension    Normal coronary arteries 12/2007    Patient Active Problem List   Diagnosis Date Noted   Near syncope 03/06/2017   Elevated troponin 03/06/2017   Hyperglycemia 03/06/2017   Hypertension    Dyslipidemia    History of gout    Normal coronary arteries 12/26/2007    Past Surgical History:  Procedure Laterality Date   CARDIAC CATHETERIZATION  12/2007   normal coronaries       Family History  Problem Relation Age of Onset   Colon cancer Maternal Uncle    Colon cancer Cousin    Dementia Mother    Alcohol abuse Father     Social History   Tobacco Use   Smoking status: Every Day    Packs/day: 1.00    Types: Cigarettes   Smokeless tobacco: Never  Vaping Use   Vaping Use: Never used  Substance Use Topics   Alcohol use: No   Drug use: No    Home Medications Prior to Admission medications    Medication Sig Start Date End Date Taking? Authorizing Provider  allopurinol (ZYLOPRIM) 100 MG tablet Take 100 mg by mouth daily. 09/03/19   [provider]  aspirin EC 81 MG EC tablet Take 1 tablet (81 mg total) by mouth daily. 03/08/17   Kayleen Memos, DO  atorvastatin (LIPITOR) 40 MG tablet Take 1 tablet (40 mg total) by mouth daily at 6 PM. Patient not taking: Reported on 01/29/2018 03/07/17   Kayleen Memos, DO  diclofenac sodium (VOLTAREN) 1 % GEL Apply 2 g topically 4 (four) times daily. Patient not taking: Reported on 03/06/2017 09/29/16   Ok Edwards, PA-C  HYDROcodone-acetaminophen (NORCO/VICODIN) 5-325 MG tablet Take 1 tablet by mouth every 6 (six) hours as needed for severe pain. Patient not taking: Reported on 01/29/2018 12/18/17   Rodell Perna A, PA-C  lidocaine (LIDODERM) 5 % Place 1 patch onto the skin daily. Remove & Discard patch within 12 hours or as directed by MD Patient not taking: Reported on 10/02/2019 01/29/18   Joy, Helane Gunther, PA-C  lisinopril-hydrochlorothiazide (PRINZIDE,ZESTORETIC) 20-12.5 MG per tablet Take 1 tablet by mouth Daily. 09/30/11   [provider]  meloxicam (MOBIC) 15 MG tablet Take 15 mg by mouth daily.    [provider]  methocarbamol (ROBAXIN) 500 MG tablet Take 1 tablet (500 mg total) by mouth  2 (two) times daily. Patient not taking: Reported on 10/02/2019 01/29/18   Lorayne Bender, PA-C  Multiple Vitamins-Minerals (MULTIVITAMIN WITH MINERALS) tablet Take 1 tablet by mouth daily.    [provider]  omeprazole (PRILOSEC OTC) 20 MG tablet Take 20 mg by mouth as needed (acid reflux).    [provider]  ondansetron (ZOFRAN) 4 MG tablet Take 1 tablet (4 mg total) by mouth every 6 (six) hours. Patient not taking: Reported on 03/06/2017 07/21/16   Montine Circle, PA-C  rosuvastatin (CRESTOR) 10 MG tablet Take 10 mg by mouth at bedtime. 11/23/19   [provider]    Allergies    Patient has no known allergies.  Review of  Systems   Review of Systems  All other systems reviewed and are negative.  Physical Exam Updated Vital Signs BP (!) 173/86   Pulse 62   Temp 98 F (36.7 C) (Oral)   Resp 16   Ht '5\' 11"'$  (1.803 m)   Wt 97.5 kg   SpO2 100%   BMI 29.99 kg/m   Physical Exam Vitals and nursing note reviewed.  Constitutional:      Appearance: He is well-developed.  HENT:     Head: Normocephalic and atraumatic.  Cardiovascular:     Rate and Rhythm: Normal rate and regular rhythm.  Pulmonary:     Effort: Pulmonary effort is normal. No respiratory distress.  Abdominal:     Palpations: Abdomen is soft.     Tenderness: There is no abdominal tenderness. There is no guarding or rebound.  Genitourinary:    Comments: Erect penis.   Musculoskeletal:        General: No tenderness.  Skin:    General: Skin is warm and dry.  Neurological:     Mental Status: He is alert and oriented to person, place, and time.  Psychiatric:        Behavior: Behavior normal.    ED Results / Procedures / Treatments   Labs (all labs ordered are listed, but only abnormal results are displayed) Labs Reviewed - No data to display  EKG None  Radiology No results found.  Procedures Injection corpus cavern, priapism  Date/Time: 10/01/2020 4:25 AM Performed by: Quintella Reichert, MD Authorized by: Quintella Reichert, MD  Consent: Verbal consent obtained. Consent given by: patient Patient identity confirmed: verbally with patient Preparation: area cleansed with chlorhexidine. Local anesthesia used: no  Anesthesia: Local anesthesia used: no Patient tolerance: patient tolerated the procedure well with no immediate complications Comments: Corpus Cavernosum was injected with 100 g of phenylephrine. Upon reassessment about five minutes later patient with persistent priapism although mildly improved compared to ED presentation. Second injection of 100 g was performed with significant improvement in patient symptoms.      Medications Ordered in ED Medications  phenylephrine 200 mcg / ml CONC. DILUTION INJ (ED / Urology USE ONLY) (100 mcg Intracavernosal Given by Other 10/01/20 0402)    ED Course  I have reviewed the triage vital signs and the nursing notes.  Pertinent labs & imaging results that were available during my care of the patient were reviewed by me and considered in my medical decision making (see chart for details).    MDM Rules/Calculators/A&P                          patient here for evaluation of priapism. On examination he does have priapism. The area was injected with phenylephrine. After two doses he  had resolution of symptoms. Discussed with patient discontinuing his ED medications at this time. Discussed urology follow-up.  Final Clinical Impression(s) / ED Diagnoses Final diagnoses:  Priapism    Rx / DC Orders ED Discharge Orders     None        Quintella Reichert, MD 10/01/20 [phone]

## 2020-10-01 NOTE — ED Triage Notes (Signed)
Pt c/o erection for the past 5 hours. Pt states he took his ED medication earlier.

## 2020-10-15 ENCOUNTER — Other Ambulatory Visit: Payer: Self-pay | Admitting: Internal Medicine

## 2020-10-15 ENCOUNTER — Other Ambulatory Visit (HOSPITAL_COMMUNITY): Payer: Self-pay | Admitting: Internal Medicine

## 2020-10-15 DIAGNOSIS — M542 Cervicalgia: Secondary | ICD-10-CM

## 2020-10-16 ENCOUNTER — Other Ambulatory Visit: Payer: Self-pay

## 2020-10-16 ENCOUNTER — Ambulatory Visit (HOSPITAL_COMMUNITY)
Admission: RE | Admit: 2020-10-16 | Discharge: 2020-10-16 | Disposition: A | Discharge status: Home / Self Care | Payer: Worker's Compensation | Source: Ambulatory Visit | Attending: Internal Medicine | Admitting: Internal Medicine

## 2020-10-16 DIAGNOSIS — M542 Cervicalgia: Secondary | ICD-10-CM | POA: Insufficient documentation

## 2020-10-16 IMAGING — MR MR CERVICAL SPINE W/O CM
5 series · 36 of 48 positions shown · non-contrast
Comparison: None.

CLINICAL DATA: Neck pain radiating to left arm.

EXAM:
MRI CERVICAL SPINE WITHOUT CONTRAST
TECHNIQUE: Multiplanar, multisequence MR imaging of the cervical spine was
performed. No intravenous contrast was administered.

[Series 5: T2 · sagittal · 3.0mm · 0.69mm/px · 6 of 15 slices shown (1 of 2)]
[im 1/15]
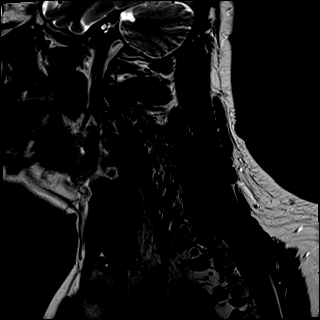
[im 3/15]
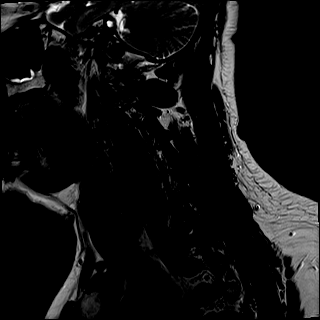
[im 6/15]
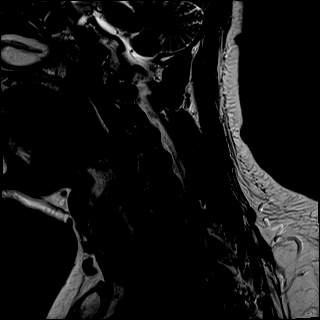
[im 9/15]
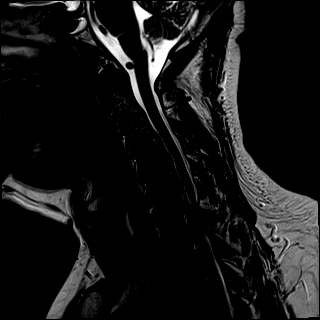
[im 12/15]
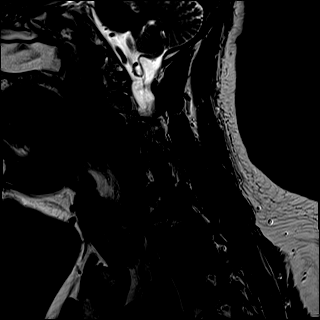
[im 15/15]
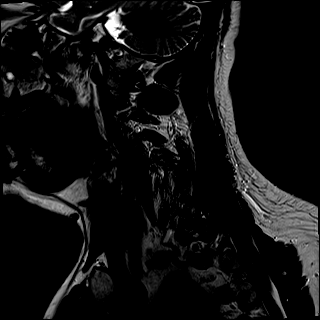

[Series 6: T1 · sagittal · 3.0mm · 0.69mm/px · 6 of 15 slices shown]
[im 1/15]
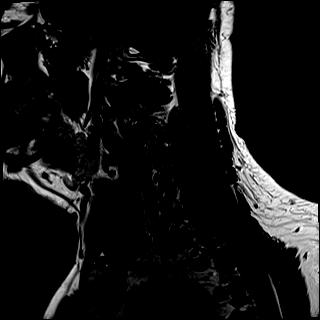
[im 3/15]
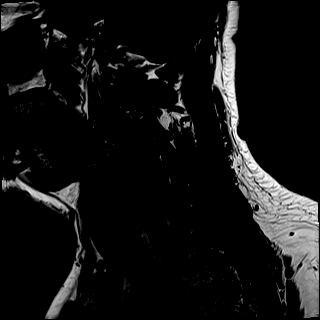
[im 6/15]
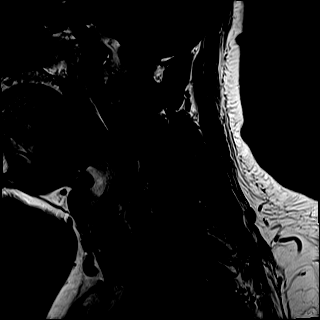
[im 9/15]
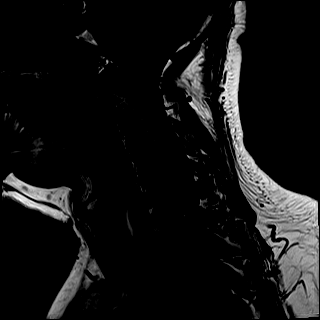
[im 12/15]
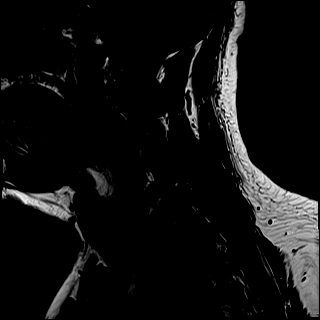
[im 15/15]
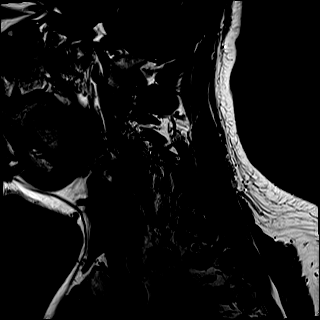

[Series 7: STIR · sagittal · 3.0mm · 0.86mm/px · 6 of 15 slices shown]
[im 1/15]
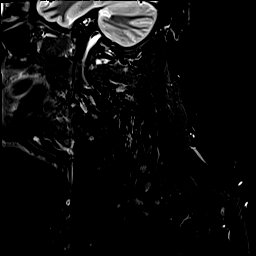
[im 3/15]
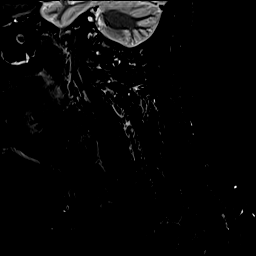
[im 6/15]
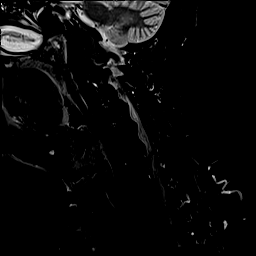
[im 9/15]
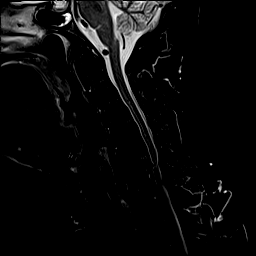
[im 12/15]
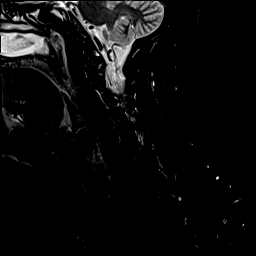
[im 15/15]
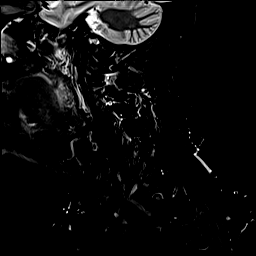

[Series 8: T2 · axial · 3.0mm · 0.66mm/px · z∈[-140,-28]mm · 10 of 37 slices shown (2 of 2)]
[im 1/37]
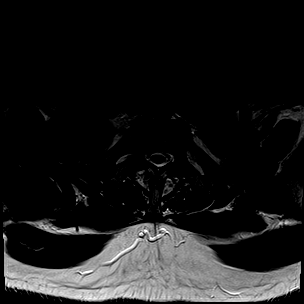
[im 3/37]
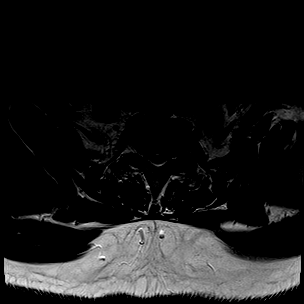
[im 6/37]
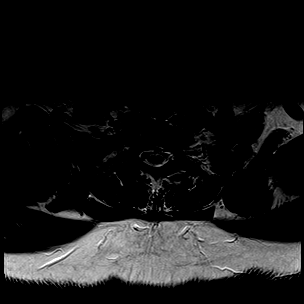
[im 11/37]
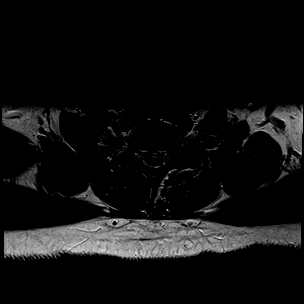
[im 16/37]
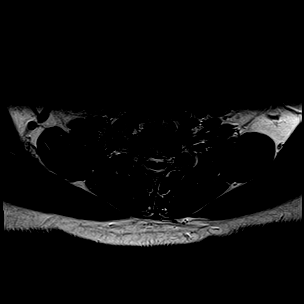
[im 19/37]
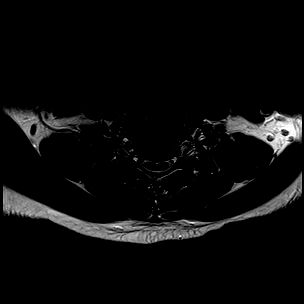
[im 21/37]
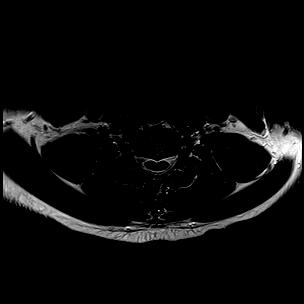
[im 26/37]
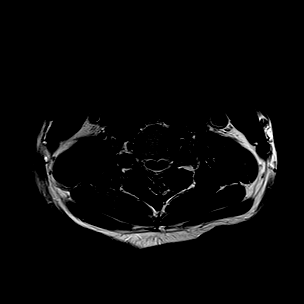
[im 31/37]
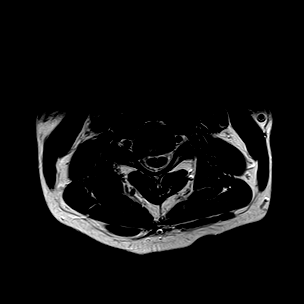
[im 37/37]
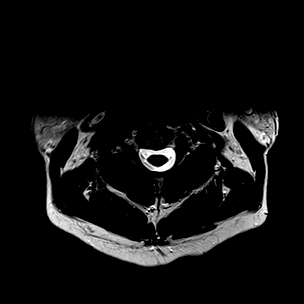

[Series 9: GRE · axial · 3.0mm · 0.39mm/px · z∈[-140,-28]mm · 8 of 37 slices shown]
[im 1/37]
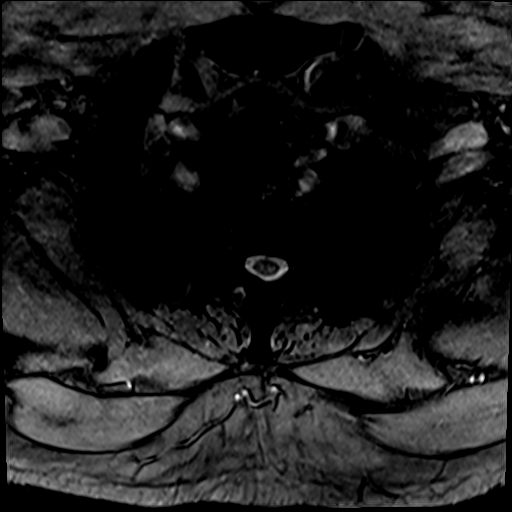
[im 6/37]
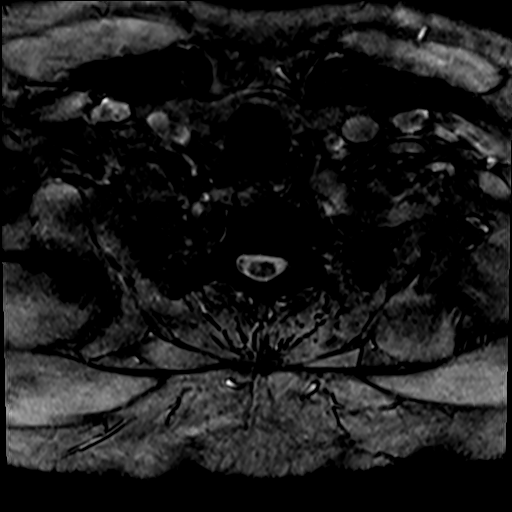
[im 11/37]
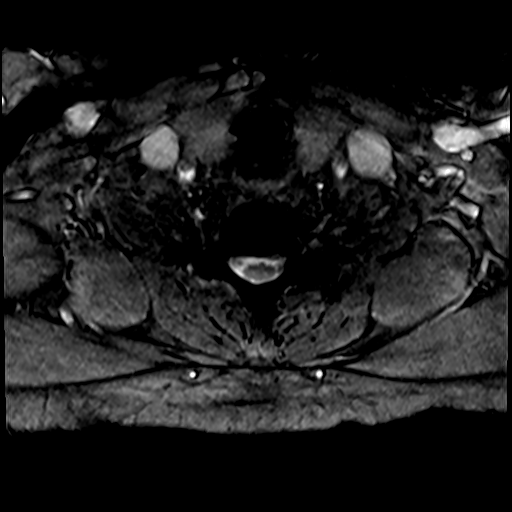
[im 16/37]
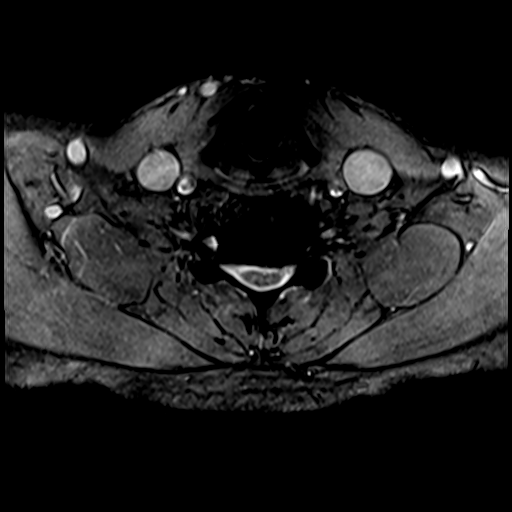
[im 21/37]
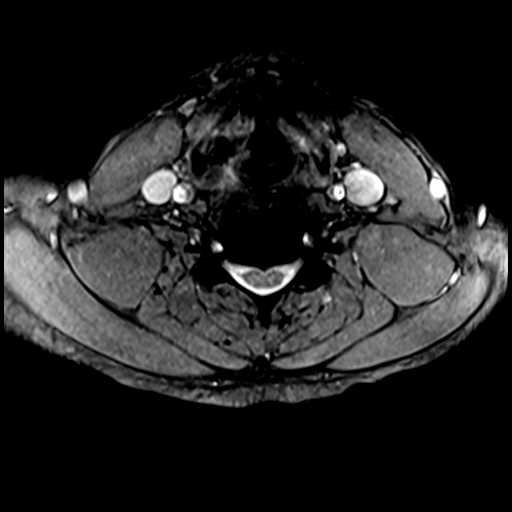
[im 26/37]
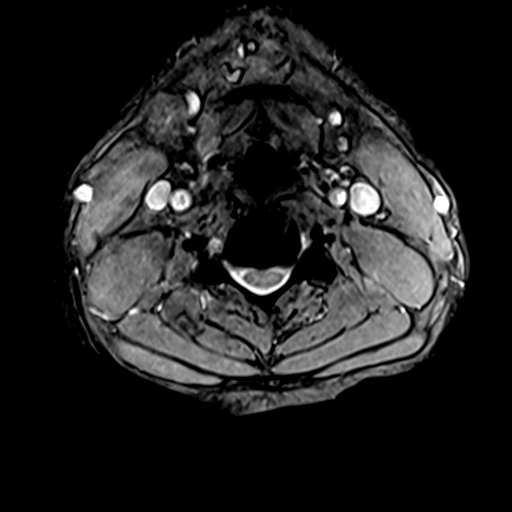
[im 31/37]
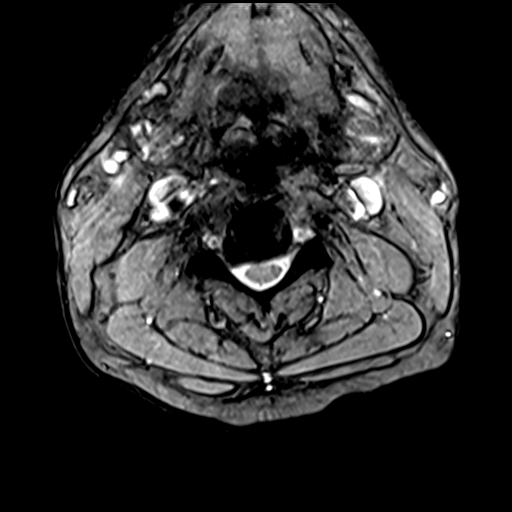
[im 37/37]
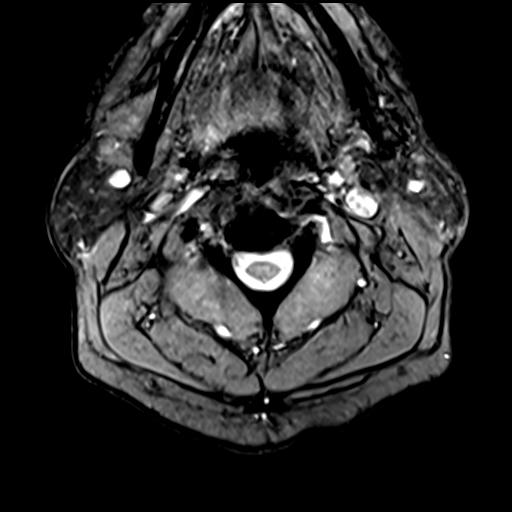

[36 of 48 positions shown; findings below may reference images not displayed]

FINDINGS: Alignment: Reversal of the normal cervical lordosis. No substantial
sagittal subluxation.

Vertebrae: Vertebral body heights are maintained. No focal marrow
edema to suggest acute fracture discitis/osteomyelitis. No
suspicious bone lesions.

Cord: Focal T2 hyperintensity within the left greater than right
cord at C6-C7.

Posterior Fossa, vertebral arteries, paraspinal tissues: Vertebral
artery flow voids are maintained. Unremarkable visualized posterior
fossa.

Disc levels:

C2-C3: Right eccentric posterior disc osteophyte complex with right
greater than left facet and uncovertebral hypertrophy. Resulting
mild right foraminal stenosis without significant canal stenosis.

C3-C4: Right eccentric posterior disc osteophyte complex with right
greater than left facet and uncovertebral hypertrophy. Resulting
mild to moderate right greater than left foraminal stenosis and mild
canal stenosis.

C4-C5: Posterior disc osteophyte complex with bilateral facet and
uncovertebral hypertrophy. Resulting severe left and moderate right
foraminal stenosis. Mild to moderate canal stenosis.

C5-C6: Posterior disc osteophyte complex with mild bilateral
uncovertebral hypertrophy. Resulting mild right foraminal stenosis.
Mild to moderate canal stenosis.

C6-C7: Left eccentric posterior disc osteophyte complex and
ligamentum flavum thickening with bilateral facet and uncovertebral
hypertrophy. Resulting severe left eccentric canal stenosis with
cord deformity. Severe left and moderate right foraminal stenosis.

C7-T1: Posterior disc osteophyte complex with ligamentum flavum
thickening. Resulting mild-to-moderate canal stenosis. Left greater
than right facet and uncovertebral hypertrophy with severe left and
moderate right foraminal stenosis.
IMPRESSION: 1. At C6-C7, severe left eccentric canal stenosis with cord
deformity. Mild T2 hyperintensity within the cord at this level
could represent edema and/or myelomalacia.
2. Severe left and moderate right foraminal stenosis at C4-C5,
C6-C7, and C7-T1.
3. Mild-to-moderate canal stenosis at C4-C5, C5-C6, and C7-T1.

## 2021-01-17 ENCOUNTER — Other Ambulatory Visit: Payer: Self-pay

## 2021-01-17 ENCOUNTER — Emergency Department (HOSPITAL_COMMUNITY)
Admission: EM | Admit: 2021-01-17 | Discharge: 2021-01-18 | Disposition: A | Discharge status: Home / Self Care | Payer: Self-pay | Attending: Emergency Medicine | Admitting: Emergency Medicine

## 2021-01-17 ENCOUNTER — Emergency Department (HOSPITAL_COMMUNITY): Payer: Self-pay

## 2021-01-17 ENCOUNTER — Encounter (HOSPITAL_COMMUNITY): Payer: Self-pay | Admitting: Emergency Medicine

## 2021-01-17 DIAGNOSIS — Z7982 Long term (current) use of aspirin: Secondary | ICD-10-CM | POA: Insufficient documentation

## 2021-01-17 DIAGNOSIS — Z79899 Other long term (current) drug therapy: Secondary | ICD-10-CM | POA: Insufficient documentation

## 2021-01-17 DIAGNOSIS — Z20822 Contact with and (suspected) exposure to covid-19: Secondary | ICD-10-CM | POA: Insufficient documentation

## 2021-01-17 DIAGNOSIS — J40 Bronchitis, not specified as acute or chronic: Secondary | ICD-10-CM | POA: Insufficient documentation

## 2021-01-17 DIAGNOSIS — I10 Essential (primary) hypertension: Secondary | ICD-10-CM | POA: Insufficient documentation

## 2021-01-17 DIAGNOSIS — F17210 Nicotine dependence, cigarettes, uncomplicated: Secondary | ICD-10-CM | POA: Insufficient documentation

## 2021-01-17 LAB — TROPONIN I (HIGH SENSITIVITY): Troponin I (High Sensitivity): 9 ng/L (ref <18)

## 2021-01-17 LAB — BASIC METABOLIC PANEL
Anion gap: 11 (ref 5–15)
BUN: 21 mg/dL (ref 8–23)
CO2: 20 mmol/L — ABNORMAL LOW (ref 22–32)
Calcium: 8.8 mg/dL — ABNORMAL LOW (ref 8.9–10.3)
Chloride: 105 mmol/L (ref 98–111)
Creatinine, Ser: 1.51 mg/dL — ABNORMAL HIGH (ref 0.61–1.24)
GFR, Estimated: 51 mL/min — ABNORMAL LOW (ref >60)
Glucose, Bld: 211 mg/dL — ABNORMAL HIGH (ref 70–99)
Potassium: 3.8 mmol/L (ref 3.5–5.1)
Sodium: 136 mmol/L (ref 135–145)

## 2021-01-17 LAB — CBC WITH DIFFERENTIAL/PLATELET
Abs Immature Granulocytes: 0.31 10*3/uL — ABNORMAL HIGH (ref 0.00–0.07)
Basophils Absolute: 0.1 10*3/uL (ref 0.0–0.1)
Basophils Relative: 1 %
Eosinophils Absolute: 0.1 10*3/uL (ref 0.0–0.5)
Eosinophils Relative: 1 %
HCT: 36.3 % — ABNORMAL LOW (ref 39.0–52.0)
Hemoglobin: 11.9 g/dL — ABNORMAL LOW (ref 13.0–17.0)
Immature Granulocytes: 2 %
Lymphocytes Relative: 29 %
Lymphs Abs: 3.8 10*3/uL (ref 0.7–4.0)
MCH: 30.1 pg (ref 26.0–34.0)
MCHC: 32.8 g/dL (ref 30.0–36.0)
MCV: 91.9 fL (ref 80.0–100.0)
Monocytes Absolute: 0.6 10*3/uL (ref 0.1–1.0)
Monocytes Relative: 4 %
Neutro Abs: 8.4 10*3/uL — ABNORMAL HIGH (ref 1.7–7.7)
Neutrophils Relative %: 63 %
Platelets: 335 10*3/uL (ref 150–400)
RBC: 3.95 MIL/uL — ABNORMAL LOW (ref 4.22–5.81)
RDW: 13.1 % (ref 11.5–15.5)
WBC: 13.2 10*3/uL — ABNORMAL HIGH (ref 4.0–10.5)
nRBC: 0 % (ref 0.0–0.2)

## 2021-01-17 IMAGING — DX DG CHEST 2V
2 series · 2 of 2 positions shown · non-contrast
Comparison: Chest radiograph dated [DATE].

CLINICAL DATA: Shortness of breath and chest pain.

EXAM:
CHEST - 2 VIEW

[w chest pa]
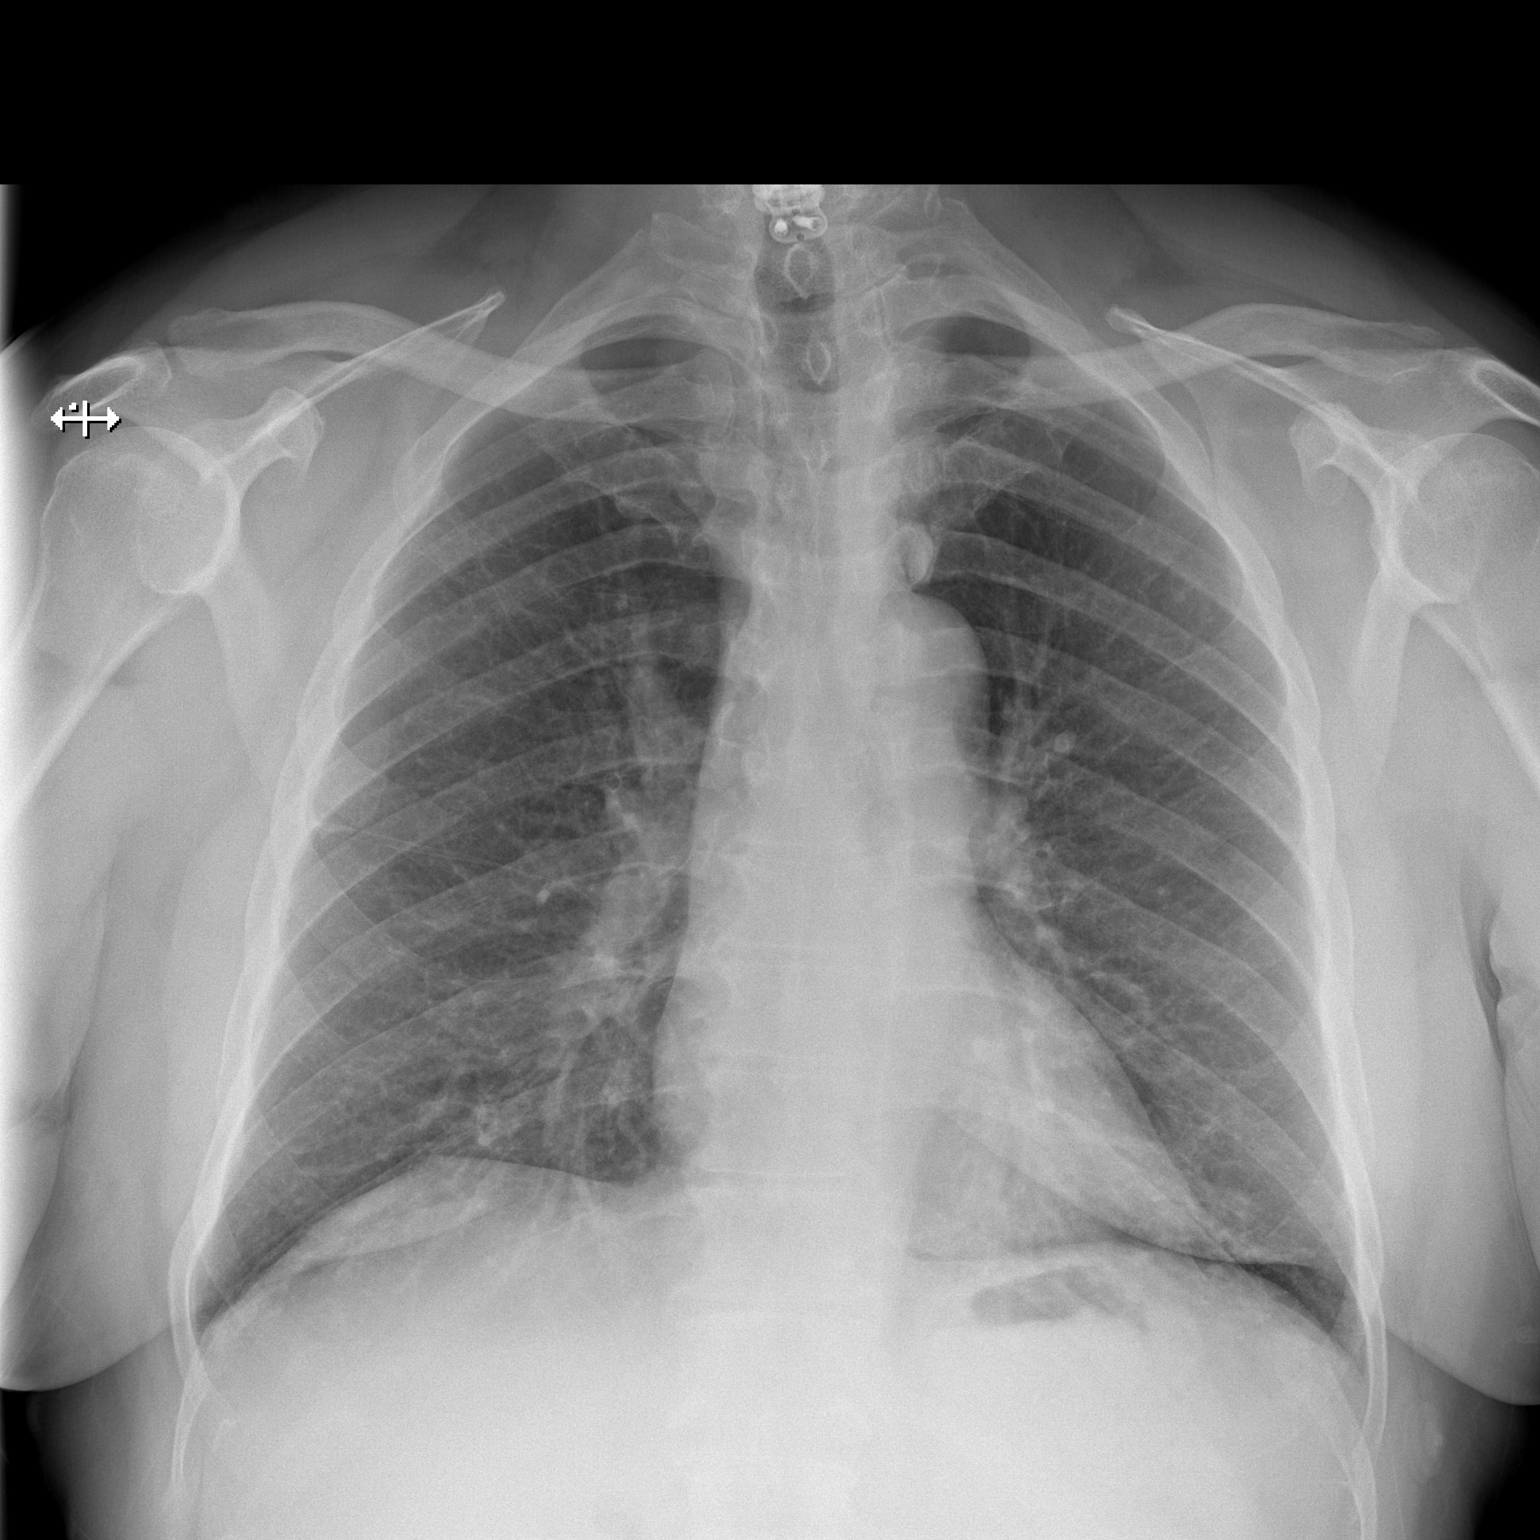

[w chest lat]
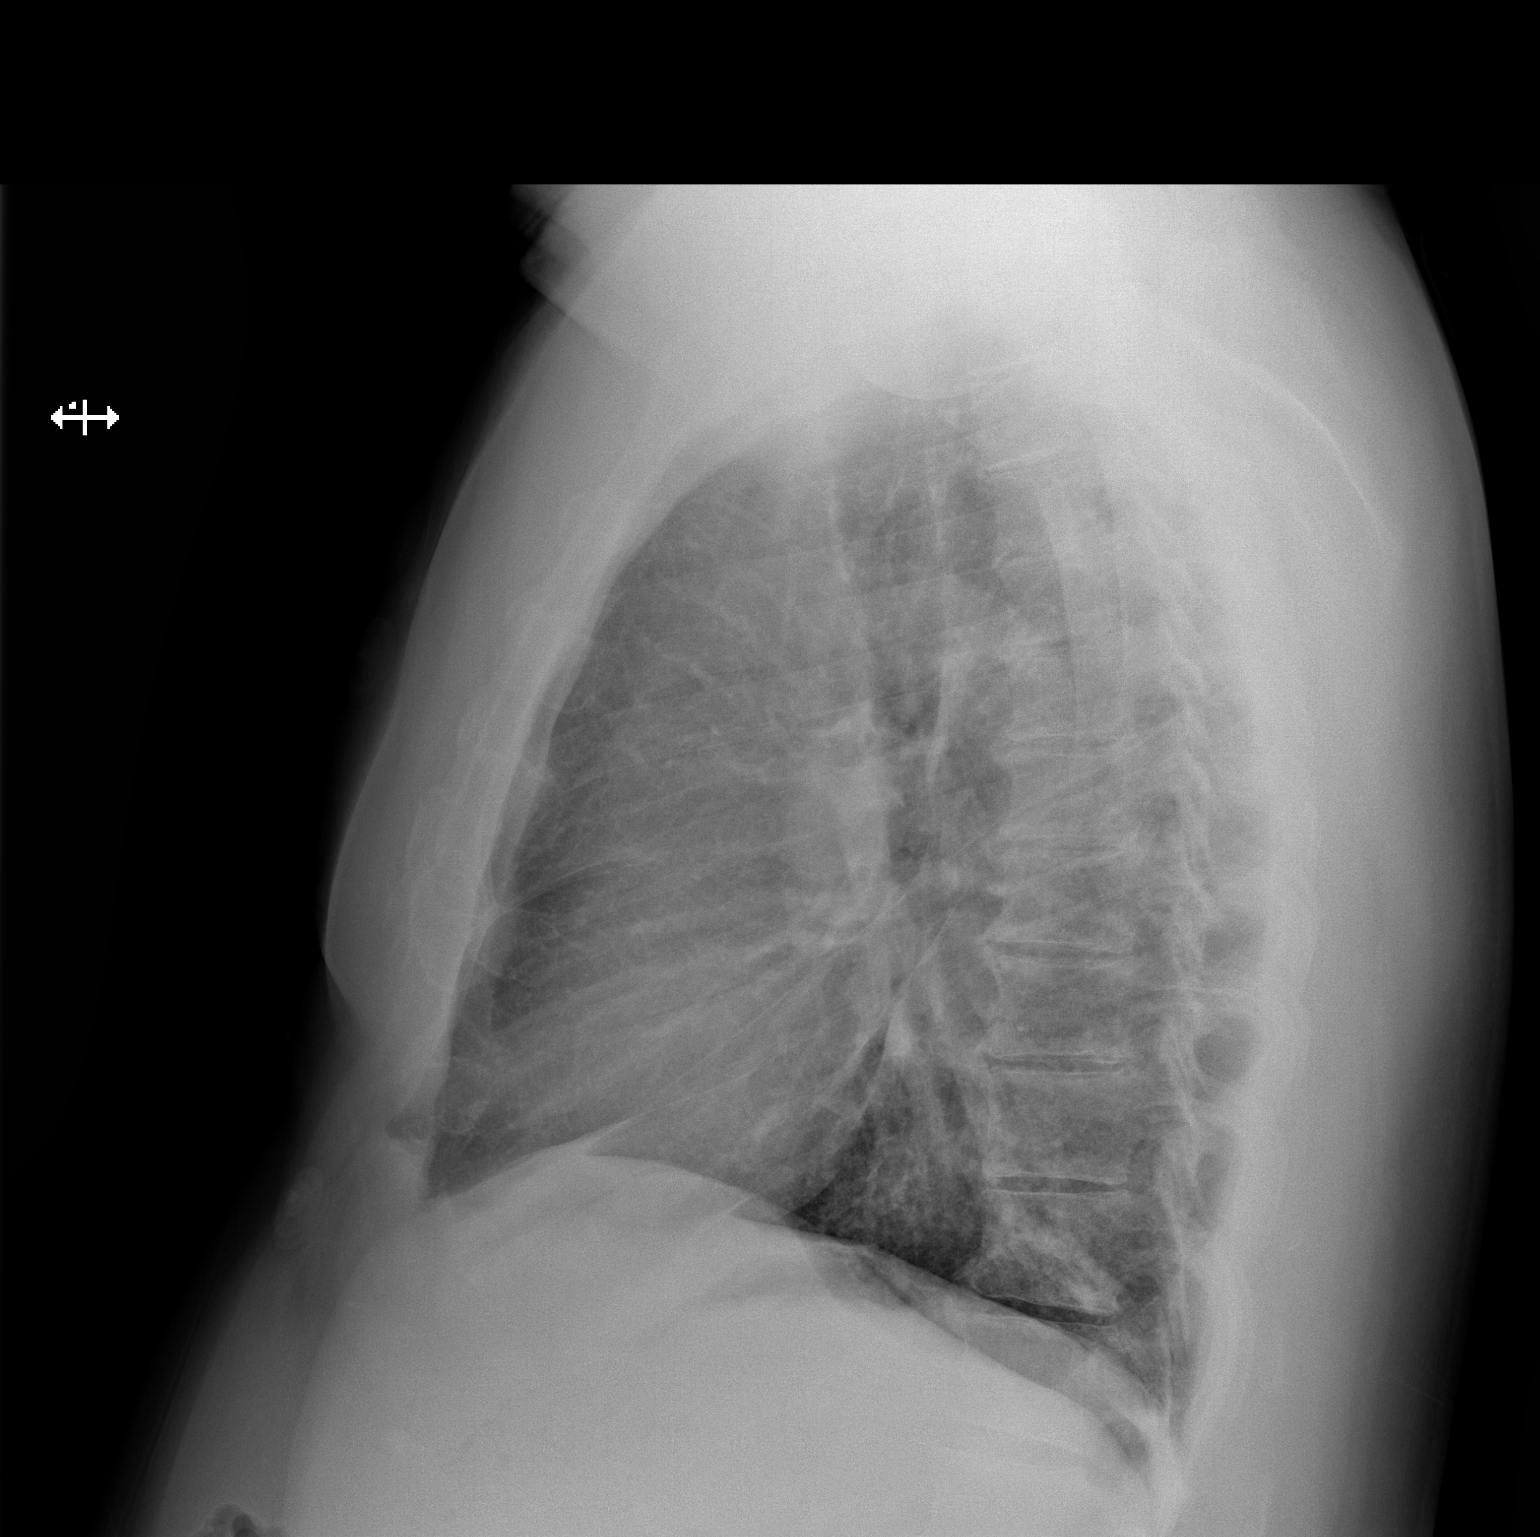

[2 of 2 positions shown; findings below may reference images not displayed]

FINDINGS: No focal consolidation, pleural effusion, or pneumothorax. The
cardiac silhouette is within normal limits. Degenerative changes of
the spine. No acute osseous pathology. Lower cervical ACDF.
IMPRESSION: No active cardiopulmonary disease.

## 2021-01-17 NOTE — ED Provider Notes (Signed)
Emergency Medicine Provider Triage Evaluation Note  Lance Goltz Sr. , a 65 y.o. male  was evaluated in triage.  Pt complains of shortness of breath.  Patient states in early November he had anterior cervical fusion without complication.  He states that he has been recovering well until about 2 weeks ago when he started having some soreness over the surgical site.  He states that then about a week ago he started having sore throat, cough and intermittent chest heaviness.  This is progressed to feeling short of breath over the past few days.  He states that he overall feels very fatigued.  Denies fevers at home.  Denies any sick contacts.  Review of Systems  Positive: See above Negative:   Physical Exam  BP (!) 194/99 (BP Location: Right Arm)    Pulse 94    Temp 97.6 F (36.4 C) (Oral)    Resp 20    SpO2 97%  Gen:   Awake, appears fatigued Resp:  Mildly increased effort.  Intermittent coarse lung sounds. MSK:   Moves extremities without difficulty  Other:  Anterior neck with surgical scar that appears well-healing.  There is no cellulitis, dehiscence of the wound or purulent drainage.  S1/S2 without murmur  Medical Decision Making  Medically screening exam initiated at 10:50 PM.  Appropriate orders placed.  [name] was informed that the remainder of the evaluation will be completed by another provider, this initial triage assessment does not replace that evaluation, and the importance of remaining in the ED until their evaluation is complete.     Mickie Hillier, PA-C 01/17/21 Rawlins, Ankit, MD 01/18/21 [phone]

## 2021-01-17 NOTE — ED Triage Notes (Signed)
Pt reported to ED with c/o SHOB, cough, sore throat and wheezing x1 week. States that he also has nasal congestion and headache intermittently. Reports having cervical fusion on 11/27/2020. States he feels that pain in his chest is related to procedure.

## 2021-01-18 LAB — D-DIMER, QUANTITATIVE: D-Dimer, Quant: 0.42 ug/mL-FEU (ref 0.00–0.50)

## 2021-01-18 LAB — RESP PANEL BY RT-PCR (FLU A&B, COVID) ARPGX2
Influenza A by PCR: NEGATIVE
Influenza B by PCR: NEGATIVE
SARS Coronavirus 2 by RT PCR: NEGATIVE

## 2021-01-18 LAB — TROPONIN I (HIGH SENSITIVITY): Troponin I (High Sensitivity): 11 ng/L (ref <18)

## 2021-01-18 MED ORDER — PREDNISONE 20 MG PO TABS: 40.0000 mg | ORAL_TABLET | Freq: Every day | ORAL | 0 refills | Status: DC

## 2021-01-18 MED ORDER — ALBUTEROL SULFATE HFA 108 (90 BASE) MCG/ACT IN AERS
2.0000 | INHALATION_SPRAY | Freq: Once | RESPIRATORY_TRACT | Status: AC
  Administered 2021-01-18: 03:00:00 2 via RESPIRATORY_TRACT
  Filled 2021-01-18: qty 6.7

## 2021-01-18 MED ORDER — BENZONATATE 100 MG PO CAPS: 100.0000 mg | ORAL_CAPSULE | Freq: Three times a day (TID) | ORAL | 0 refills | Status: DC | PRN

## 2021-01-18 MED ORDER — AEROCHAMBER PLUS FLO-VU LARGE MISC
Status: AC
  Filled 2021-01-18: qty 1

## 2021-01-18 NOTE — ED Provider Notes (Signed)
[facility] EMERGENCY DEPARTMENT Provider Note   CSN: [id number] Arrival date & time: 01/17/21  2224     History Chief Complaint  Patient presents with   Shortness of Breath    [name]. is a 65 y.o. male.  64 year old male with a history of dyslipidemia, hypertension presents to the emergency department complaining of shortness of breath.  He reports that his symptoms have been present over the past week.  Notes some central chest pressure with his shortness of breath in addition to a cough productive of phlegm.  Noticed blood streaked in his phlegm x1 today.  Otherwise, no frank hemoptysis.  Also reports some associated sore throat and wheezing.  Has tried multiple over-the-counter remedies for symptoms without improvement.  Does have a history of cervical fusion on 11/27/2020.  Has had some soreness at his surgical site which has been aggravated with his persistent cough.  No fevers, sick contacts.  The history is provided by the patient. No language interpreter was used.  Shortness of Breath     Past Medical History:  Diagnosis Date   Dyslipidemia    History of gout    Hypertension    Normal coronary arteries 12/2007    Patient Active Problem List   Diagnosis Date Noted   Near syncope 03/06/2017   Elevated troponin 03/06/2017   Hyperglycemia 03/06/2017   Hypertension    Dyslipidemia    History of gout    Normal coronary arteries 12/26/2007    Past Surgical History:  Procedure Laterality Date   CARDIAC CATHETERIZATION  12/2007   normal coronaries       Family History  Problem Relation Age of Onset   Colon cancer Maternal Uncle    Colon cancer Cousin    Dementia Mother    Alcohol abuse Father     Social History   Tobacco Use   Smoking status: Every Day    Packs/day: 1.00    Types: Cigarettes   Smokeless tobacco: Never  Vaping Use   Vaping Use: Never used  Substance Use Topics   Alcohol use: No   Drug use: No    Home  Medications Prior to Admission medications   Medication Sig Start Date End Date Taking? Authorizing Provider  allopurinol (ZYLOPRIM) 100 MG tablet Take 100 mg by mouth daily. 09/03/19  Yes [provider]  aspirin EC 81 MG EC tablet Take 1 tablet (81 mg total) by mouth daily. 03/08/17  Yes Hall, Carole N, DO  benzonatate (TESSALON) 100 MG capsule Take 1 capsule (100 mg total) by mouth 3 (three) times daily as needed for cough. 01/18/21  Yes Antonietta Breach, PA-C  HYDROcodone-acetaminophen (NORCO) 10-325 MG tablet Take 1 tablet by mouth every 4 (four) hours as needed for moderate pain. 11/28/20  Yes [provider]  lisinopril-hydrochlorothiazide (PRINZIDE,ZESTORETIC) 20-12.5 MG per tablet Take 1 tablet by mouth Daily. 09/30/11  Yes [provider]  meloxicam (MOBIC) 15 MG tablet Take 15 mg by mouth daily as needed for pain.   Yes [provider]  Multiple Vitamins-Minerals (MULTIVITAMIN WITH MINERALS) tablet Take 1 tablet by mouth daily.   Yes [provider]  omeprazole (PRILOSEC OTC) 20 MG tablet Take 20 mg by mouth daily as needed (acid reflux).   Yes [provider]  ondansetron (ZOFRAN) 4 MG tablet Take 1 tablet (4 mg total) by mouth every 6 (six) hours. Patient taking differently: Take 4 mg by mouth every 8 (eight) hours as needed for vomiting  or nausea. 07/21/16  Yes Montine Circle, PA-C  oxyCODONE (OXY IR/ROXICODONE) 5 MG immediate release tablet Take 5 mg by mouth every 6 (six) hours as needed for moderate pain. 10/16/20  Yes [provider]  predniSONE (DELTASONE) 20 MG tablet Take 2 tablets (40 mg total) by mouth daily. 01/18/21  Yes Antonietta Breach, PA-C  atorvastatin (LIPITOR) 40 MG tablet Take 1 tablet (40 mg total) by mouth daily at 6 PM. Patient not taking: Reported on 01/29/2018 03/07/17   Kayleen Memos, DO  diclofenac sodium (VOLTAREN) 1 % GEL Apply 2 g topically 4 (four) times daily. Patient not taking: Reported on 03/06/2017 09/29/16    Ok Edwards, PA-C  HYDROcodone-acetaminophen (NORCO/VICODIN) 5-325 MG tablet Take 1 tablet by mouth every 6 (six) hours as needed for severe pain. Patient not taking: Reported on 01/29/2018 12/18/17   Rodell Perna A, PA-C  lidocaine (LIDODERM) 5 % Place 1 patch onto the skin daily. Remove & Discard patch within 12 hours or as directed by MD Patient not taking: Reported on 10/02/2019 01/29/18   Joy, Helane Gunther, PA-C  methocarbamol (ROBAXIN) 500 MG tablet Take 1 tablet (500 mg total) by mouth 2 (two) times daily. Patient not taking: Reported on 10/02/2019 01/29/18   Lorayne Bender, PA-C    Allergies    Patient has no known allergies.  Review of Systems   Review of Systems  Respiratory:  Positive for shortness of breath.   Ten systems reviewed and are negative for acute change, except as noted in the HPI.    Physical Exam Updated Vital Signs BP 130/80    Pulse 81    Temp 97.6 F (36.4 C) (Oral)    Resp 15    Ht 5\' 11"  (1.803 m)    Wt 102.1 kg    SpO2 96%    BMI 31.38 kg/m   Physical Exam Vitals and nursing note reviewed.  Constitutional:      General: He is not in acute distress.    Appearance: He is well-developed. He is not diaphoretic.     Comments: Nontoxic appearing. Obese AA male.  HENT:     Head: Normocephalic and atraumatic.  Eyes:     General: No scleral icterus.    Conjunctiva/sclera: Conjunctivae normal.  Neck:     Comments: Incision to anterior neck from cervical fusion healing well. No induration, erythema, drainage, TTP. Cardiovascular:     Rate and Rhythm: Normal rate and regular rhythm.     Pulses: Normal pulses.  Pulmonary:     Effort: Pulmonary effort is normal. No respiratory distress.     Breath sounds: No stridor. No rhonchi or rales.     Comments: Adventitious breath sounds bilateral bases on exhalation. Musculoskeletal:        General: Normal range of motion.     Cervical back: Normal range of motion.     Comments: No BLE edema  Skin:    General: Skin is warm and  dry.     Coloration: Skin is not pale.     Findings: No erythema or rash.  Neurological:     Mental Status: He is alert and oriented to person, place, and time.     Coordination: Coordination normal.  Psychiatric:        Behavior: Behavior normal.    ED Results / Procedures / Treatments   Labs (all labs ordered are listed, but only abnormal results are displayed) Labs Reviewed  BASIC METABOLIC PANEL - Abnormal; Notable for the  following components:      Result Value   CO2 20 (*)    Glucose, Bld 211 (*)    Creatinine, Ser 1.51 (*)    Calcium 8.8 (*)    GFR, Estimated 51 (*)    All other components within normal limits  CBC WITH DIFFERENTIAL/PLATELET - Abnormal; Notable for the following components:   WBC 13.2 (*)    RBC 3.95 (*)    Hemoglobin 11.9 (*)    HCT 36.3 (*)    Neutro Abs 8.4 (*)    Abs Immature Granulocytes 0.31 (*)    All other components within normal limits  RESP PANEL BY RT-PCR (FLU A&B, COVID) ARPGX2  D-DIMER, QUANTITATIVE  TROPONIN I (HIGH SENSITIVITY)  TROPONIN I (HIGH SENSITIVITY)    EKG EKG Interpretation  Date/Time:  Saturday January 17 2021 23:00:20 EST Ventricular Rate:  84 PR Interval:  132 QRS Duration: 84 QT Interval:  368 QTC Calculation: 434 R Axis:   40 Text Interpretation: Normal sinus rhythm Nonspecific ST abnormality Abnormal ECG Confirmed by Quintella Reichert [phone]) on 01/18/2021 12:53:57 AM  Radiology DG Chest 2 View  Result Date: 01/17/2021 CLINICAL DATA:  Shortness of breath and chest pain. EXAM: CHEST - 2 VIEW COMPARISON:  Chest radiograph dated 11/27/2020. FINDINGS: No focal consolidation, pleural effusion, or pneumothorax. The cardiac silhouette is within normal limits. Degenerative changes of the spine. No acute osseous pathology. Lower cervical ACDF. IMPRESSION: No active cardiopulmonary disease. Electronically Signed   By: Anner Crete M.D.   On: 01/17/2021 23:16    Procedures Procedures   Medications Ordered in  ED Medications  albuterol (VENTOLIN HFA) 108 (90 Base) MCG/ACT inhaler 2 puff (2 puffs Inhalation Given 01/18/21 0309)  AeroChamber Plus Flo-Vu Large MISC (  Given 01/18/21 0315)    ED Course  I have reviewed the triage vital signs and the nursing notes.  Pertinent labs & imaging results that were available during my care of the patient were reviewed by me and considered in my medical decision making (see chart for details).  Clinical Course as of 01/18/21 7782  Nancy Fetter Jan 18, 2021  0304 Well's criteria low risk for PE. D dimer is also negative. Doubt pulmonary embolus as cause of symptoms today. Pending delta troponin. [KH]  0605 Patient's vitals continue to be stable.  He has no hypoxia.  Repeat auscultation of lung sounds are unchanged.  No rales or rhonchi.  He has no hypoxia.  Plan to assess oxygen saturations while ambulating.  If no hypoxia, will discharge for patient to follow-up further with his primary doctor. [UM]  3536 Patient with sats of 95% with ambulation. Cough noted by RN during ambulation assessment. [KH]    Clinical Course User Index [KH] Beverely Pace   MDM Rules/Calculators/A&P                          65 year old male presents to the emergency department for evaluation of shortness of breath as well as cough.  His symptoms have been present over the past week.  Suspect that his complaints are related to a postinfectious bronchitis.  He has no evidence of pneumonia, pleural effusion, pneumothorax on chest x-ray.  No vascular congestion.  Heart size appears normal.  Troponin negative x2.  EKG without signs of acute ischemia; no STEMI.  Overall, atypical complaints for ACS; doubt this today.  PE considered given recent surgery. No tachypnea, dyspnea, hypoxia today. D dimer negative. Doubt PE. Respiratory panel  today is negative for COVID, influenza.  Plan to manage symptoms as an outpatient with prednisone burst, albuterol as needed.  Given prescription for Tessalon for  cough management.  Return precautions discussed and provided. Patient discharged in stable condition with no unaddressed concerns.     Final Clinical Impression(s) / ED Diagnoses Final diagnoses:  Bronchitis    Rx / DC Orders ED Discharge Orders          Ordered    predniSONE (DELTASONE) 20 MG tablet  Daily        01/18/21 0624    benzonatate (TESSALON) 100 MG capsule  3 times daily PRN        01/18/21 0625             Antonietta Breach, PA-C 01/18/21 0932    Quintella Reichert, MD 01/19/21 [phone]

## 2021-01-18 NOTE — Discharge Instructions (Addendum)
Your evaluation in the emergency department today was reassuring.  We suspect that you may have residual symptoms left over from a recent viral illness.  This can sometimes cause a postinfectious bronchitis which results in inflammation in your airways.  Continue use of an albuterol inhaler; 2 puffs every 4-6 hours as needed for cough, wheezing, shortness of breath.  You have been prescribed a course of prednisone.  Take as prescribed until finished.  We advise follow-up with your primary care doctor for recheck.  Ideally, follow-up should occur within 1 week.  Return to the ED for new or concerning symptoms.

## 2021-03-03 ENCOUNTER — Ambulatory Visit: Payer: Self-pay

## 2021-04-19 ENCOUNTER — Emergency Department (HOSPITAL_COMMUNITY)
Admission: EM | Admit: 2021-04-19 | Discharge: 2021-04-19 | Disposition: A | Discharge status: Home / Self Care | Payer: Medicare Other | Attending: Emergency Medicine | Admitting: Emergency Medicine

## 2021-04-19 ENCOUNTER — Other Ambulatory Visit: Payer: Self-pay

## 2021-04-19 ENCOUNTER — Encounter (HOSPITAL_COMMUNITY): Payer: Self-pay | Admitting: Emergency Medicine

## 2021-04-19 DIAGNOSIS — M79605 Pain in left leg: Secondary | ICD-10-CM | POA: Diagnosis present

## 2021-04-19 DIAGNOSIS — Z79899 Other long term (current) drug therapy: Secondary | ICD-10-CM | POA: Diagnosis not present

## 2021-04-19 DIAGNOSIS — M5432 Sciatica, left side: Secondary | ICD-10-CM | POA: Diagnosis not present

## 2021-04-19 DIAGNOSIS — M10071 Idiopathic gout, right ankle and foot: Secondary | ICD-10-CM | POA: Insufficient documentation

## 2021-04-19 DIAGNOSIS — I10 Essential (primary) hypertension: Secondary | ICD-10-CM | POA: Diagnosis not present

## 2021-04-19 DIAGNOSIS — M109 Gout, unspecified: Secondary | ICD-10-CM

## 2021-04-19 DIAGNOSIS — Z7982 Long term (current) use of aspirin: Secondary | ICD-10-CM | POA: Insufficient documentation

## 2021-04-19 DIAGNOSIS — M5410 Radiculopathy, site unspecified: Secondary | ICD-10-CM | POA: Diagnosis not present

## 2021-04-19 MED ORDER — OXYCODONE-ACETAMINOPHEN 5-325 MG PO TABS
1.0000 | ORAL_TABLET | ORAL | Status: DC | PRN
  Administered 2021-04-19: 1 via ORAL
  Filled 2021-04-19: qty 1

## 2021-04-19 MED ORDER — PREDNISONE 20 MG PO TABS
60.0000 mg | ORAL_TABLET | Freq: Once | ORAL | Status: AC
Start: 2021-04-19 — End: 2021-04-19
  Administered 2021-04-19: 60 mg via ORAL
  Filled 2021-04-19: qty 3

## 2021-04-19 MED ORDER — HYDROCODONE-ACETAMINOPHEN 5-325 MG PO TABS: 1.0000 | ORAL_TABLET | Freq: Four times a day (QID) | ORAL | 0 refills | Status: DC | PRN

## 2021-04-19 MED ORDER — PREDNISONE 20 MG PO TABS: 40.0000 mg | ORAL_TABLET | Freq: Every day | ORAL | 0 refills | Status: DC

## 2021-04-19 MED ORDER — KETOROLAC TROMETHAMINE 30 MG/ML IJ SOLN
30.0000 mg | Freq: Once | INTRAMUSCULAR | Status: AC
  Administered 2021-04-19: 30 mg via INTRAMUSCULAR
  Filled 2021-04-19: qty 1

## 2021-04-19 NOTE — ED Triage Notes (Signed)
Pt c/o left leg pain from hip down and bilateral foot swollen for the past 3 weeks. ?

## 2021-04-19 NOTE — ED Provider Notes (Signed)
?[facility] ?Provider Note ? ? ?CSN: [id number] ?Arrival date & time: 04/19/21  0405 ? ?  ? ?History ? ?Chief Complaint  ?Patient presents with  ? Leg Pain  ? ? ?Lance CAPEK Sr. is a 66 y.o. male. ? ?Patient is a 66 year old male with a history of hypertension, gout who is presenting today with complaints of right foot pain for the last 2 to 3 weeks and also pain in the left leg.  Patient reports he is on allopurinol daily and he is compliant with that medication.  For the last 2 weeks since his right foot has been hot swollen and red he has been taking naproxen as well but reports it is just not getting better.  He also started noticing 2 weeks ago that he had pain that starts in his left buttocks and shoots down to his knee.  Seems okay when he is laying down but it is much worse when he is up trying to walk around.  He is even had to start using a cane because it so uncomfortable to walk.  He has not had any nausea, vomiting, abdominal pain, fevers.  He recently had blood work done through his doctor's office because he has an appointment on Tuesday and reports his A1c came back at 6 and the rest of his labs were basically normal.  He has no prior history of kidney problems or diabetes.  He denies any trauma. ? ?The history is provided by the patient and medical records.  ?Leg Pain ? ?  ? ?Home Medications ?Prior to Admission medications   ?Medication Sig Start Date End Date Taking? Authorizing Provider  ?HYDROcodone-acetaminophen (NORCO/VICODIN) 5-325 MG tablet Take 1 tablet by mouth every 6 (six) hours as needed. 04/19/21  Yes Blanchie Dessert, MD  ?predniSONE (DELTASONE) 20 MG tablet Take 2 tablets (40 mg total) by mouth daily. 04/19/21  Yes Blanchie Dessert, MD  ?allopurinol (ZYLOPRIM) 100 MG tablet Take 100 mg by mouth daily. 09/03/19   [provider]  ?aspirin EC 81 MG EC tablet Take 1 tablet (81 mg total) by mouth daily. 03/08/17   Kayleen Memos, DO   ?atorvastatin (LIPITOR) 40 MG tablet Take 1 tablet (40 mg total) by mouth daily at 6 PM. ?Patient not taking: Reported on 01/29/2018 03/07/17   Kayleen Memos, DO  ?benzonatate (TESSALON) 100 MG capsule Take 1 capsule (100 mg total) by mouth 3 (three) times daily as needed for cough. 01/18/21   Antonietta Breach, PA-C  ?diclofenac sodium (VOLTAREN) 1 % GEL Apply 2 g topically 4 (four) times daily. ?Patient not taking: Reported on 03/06/2017 09/29/16   Ok Edwards, PA-C  ?lidocaine (LIDODERM) 5 % Place 1 patch onto the skin daily. Remove & Discard patch within 12 hours or as directed by MD ?Patient not taking: Reported on 10/02/2019 01/29/18   Lorayne Bender, PA-C  ?lisinopril-hydrochlorothiazide (PRINZIDE,ZESTORETIC) 20-12.5 MG per tablet Take 1 tablet by mouth Daily. 09/30/11   [provider]  ?meloxicam (MOBIC) 15 MG tablet Take 15 mg by mouth daily as needed for pain.    [provider]  ?methocarbamol (ROBAXIN) 500 MG tablet Take 1 tablet (500 mg total) by mouth 2 (two) times daily. ?Patient not taking: Reported on 10/02/2019 01/29/18   Lorayne Bender, PA-C  ?Multiple Vitamins-Minerals (MULTIVITAMIN WITH MINERALS) tablet Take 1 tablet by mouth daily.    [provider]  ?omeprazole (PRILOSEC OTC) 20 MG tablet Take 20 mg by mouth  daily as needed (acid reflux).    [provider]  ?ondansetron (ZOFRAN) 4 MG tablet Take 1 tablet (4 mg total) by mouth every 6 (six) hours. ?Patient taking differently: Take 4 mg by mouth every 8 (eight) hours as needed for vomiting or nausea. 07/21/16   Montine Circle, PA-C  ?oxyCODONE (OXY IR/ROXICODONE) 5 MG immediate release tablet Take 5 mg by mouth every 6 (six) hours as needed for moderate pain. 10/16/20   [provider]  ?   ? ?Allergies    ?Patient has no known allergies.   ? ?Review of Systems   ?Review of Systems ? ?Physical Exam ?Updated Vital Signs ?BP (!) 158/88   Pulse 69   Temp 98.1 ?F (36.7 ?C) (Oral)   Resp 18   Ht 6' (1.829 m)   Wt 102.1 kg    SpO2 100%   BMI 30.52 kg/m?  ?Physical Exam ?Vitals and nursing note reviewed.  ?Constitutional:   ?   General: He is not in acute distress. ?   Appearance: He is well-developed.  ?HENT:  ?   Head: Normocephalic and atraumatic.  ?Eyes:  ?   Conjunctiva/sclera: Conjunctivae normal.  ?   Pupils: Pupils are equal, round, and reactive to light.  ?Cardiovascular:  ?   Rate and Rhythm: Normal rate and regular rhythm.  ?   Pulses: Normal pulses.  ?   Heart sounds: No murmur heard. ?Pulmonary:  ?   Effort: Pulmonary effort is normal. No respiratory distress.  ?   Breath sounds: Normal breath sounds. No wheezing or rales.  ?Abdominal:  ?   General: There is no distension.  ?   Palpations: Abdomen is soft.  ?   Tenderness: There is no abdominal tenderness. There is no guarding or rebound.  ?Musculoskeletal:     ?   General: Tenderness present. Normal range of motion.  ?   Cervical back: Normal range of motion and neck supple.  ?     Back: ? ?     Feet: ? ?Skin: ?   General: Skin is warm and dry.  ?   Findings: No erythema or rash.  ?Neurological:  ?   Mental Status: He is alert and oriented to person, place, and time. Mental status is at baseline.  ?   Sensory: No sensory deficit.  ?   Motor: No weakness.  ?   Comments: 5 out of 5 strength in the left lower extremity.  Full range of motion.  Mild pain with straight leg raise.  Normal plantar and dorsiflexion of the foot  ?Psychiatric:     ?   Mood and Affect: Mood normal.     ?   Behavior: Behavior normal.  ? ? ?ED Results / Procedures / Treatments   ?Labs ?(all labs ordered are listed, but only abnormal results are displayed) ?Labs Reviewed - No data to display ? ?EKG ?None ? ?Radiology ?No results found. ? ?Procedures ?Procedures  ? ? ?Medications Ordered in ED ?Medications  ?oxyCODONE-acetaminophen (PERCOCET/ROXICET) 5-325 MG per tablet 1 tablet (1 tablet Oral Given 04/19/21 0417)  ?ketorolac (TORADOL) 30 MG/ML injection 30 mg (has no administration in time range)   ?predniSONE (DELTASONE) tablet 60 mg (has no administration in time range)  ? ? ?ED Course/ Medical Decision Making/ A&P ?  ?                        ?Medical Decision Making ?Amount and/or Complexity of Data Reviewed ?  External Data Reviewed: labs and notes. ? ?Risk ?Prescription drug management. ? ? ?Patient is a 66 year old male presenting today with initially symptoms classic for gout in his right foot that is been resistant to him taking naproxen and he is already on allopurinol.  No evidence for septic joint or DVT at this time.  No signs of cellulitis.  Will treat with prednisone and encourage dietary changes.  Patient had lab work done by his PCP last week and he told me the results and he had normal creatinine at that time.  Patient was given a dose of Toradol here will be sent home with short course of prednisone.  Secondly patient is having more radicular pain in his left legs consistent with sciatica.  Most likely from limping from the pain in his right foot.  Discussed stretching and back exercises.  He will follow-up with his doctor on Tuesday and be reassessed hopefully at that time the gout will be improving and his back pain will also be improving.  He has no abdominal findings today to suggest acute abdominal process.  He has no symptoms suggestive of cauda equina. ? ? ? ? ? ? ? ?Final Clinical Impression(s) / ED Diagnoses ?Final diagnoses:  ?Acute gout of right foot, unspecified cause  ?Left sided sciatica  ? ? ?Rx / DC Orders ?ED Discharge Orders   ? ?      Ordered  ?  predniSONE (DELTASONE) 20 MG tablet  Daily       ? 04/19/21 0828  ?  HYDROcodone-acetaminophen (NORCO/VICODIN) 5-325 MG tablet  Every 6 hours PRN       ? 04/19/21 0828  ? ?  ?  ? ?  ? ? ?  ?Blanchie Dessert, MD ?04/19/21 [id number] ? ?

## 2021-04-22 ENCOUNTER — Other Ambulatory Visit: Payer: Self-pay | Admitting: Internal Medicine

## 2021-04-22 DIAGNOSIS — F17211 Nicotine dependence, cigarettes, in remission: Secondary | ICD-10-CM

## 2021-04-24 ENCOUNTER — Other Ambulatory Visit: Payer: Self-pay | Admitting: Orthopaedic Surgery

## 2021-04-24 DIAGNOSIS — M5136 Other intervertebral disc degeneration, lumbar region: Secondary | ICD-10-CM

## 2021-04-24 DIAGNOSIS — M51369 Other intervertebral disc degeneration, lumbar region without mention of lumbar back pain or lower extremity pain: Secondary | ICD-10-CM

## 2021-04-24 DIAGNOSIS — M4726 Other spondylosis with radiculopathy, lumbar region: Secondary | ICD-10-CM

## 2021-05-05 ENCOUNTER — Ambulatory Visit
Admission: RE | Admit: 2021-05-05 | Discharge: 2021-05-05 | Disposition: A | Discharge status: Home / Self Care | Payer: Medicare Other | Source: Ambulatory Visit | Attending: Orthopaedic Surgery | Admitting: Orthopaedic Surgery

## 2021-05-05 DIAGNOSIS — M5136 Other intervertebral disc degeneration, lumbar region: Secondary | ICD-10-CM

## 2021-05-05 DIAGNOSIS — M4726 Other spondylosis with radiculopathy, lumbar region: Secondary | ICD-10-CM

## 2021-05-05 IMAGING — MR MR LUMBAR SPINE W/O CM
4 of 5 series · 18 of 48 positions shown · non-contrast
Comparison: Lumbar radiographs [DATE]. Lumbar spine CT
[DATE].

CLINICAL DATA: 65-year-old male with low back pain radiating to the
left leg for 5 months. No known injury.

EXAM:
MRI LUMBAR SPINE WITHOUT CONTRAST
TECHNIQUE: Multiplanar, multisequence MR imaging of the lumbar spine was
performed. No intravenous contrast was administered.

[Series 5: T2 · sagittal · 4.0mm · 0.73mm/px · 6 of 16 slices shown (1 of 2)]
[im 1/16]
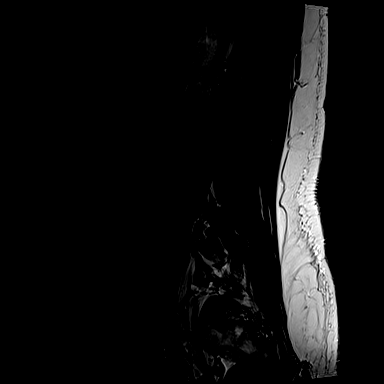
[im 4/16]
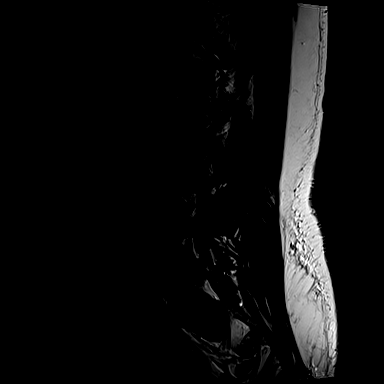
[im 7/16]
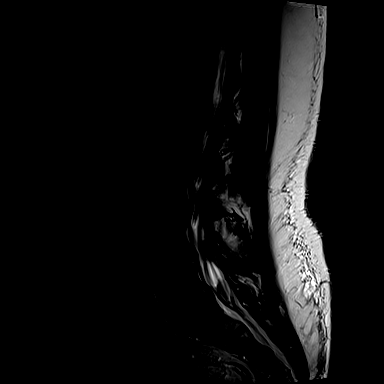
[im 10/16]
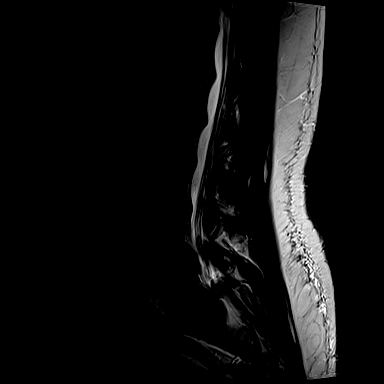
[im 13/16]
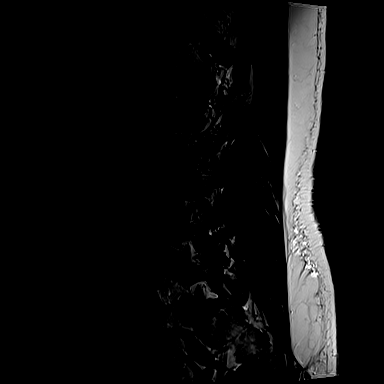
[im 16/16]
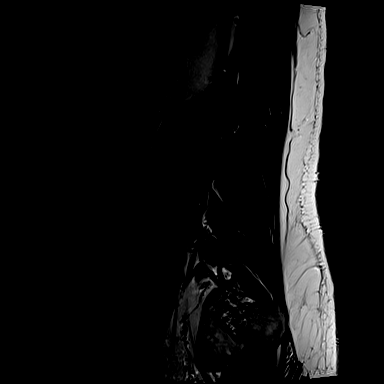

[Series 6: T1 · sagittal · 4.0mm · 0.73mm/px · 3 of 16 slices shown (1 of 2)]
[im 4/16]
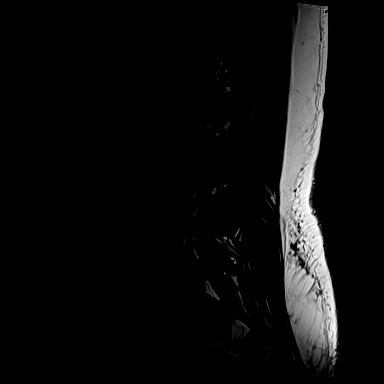
[im 10/16]
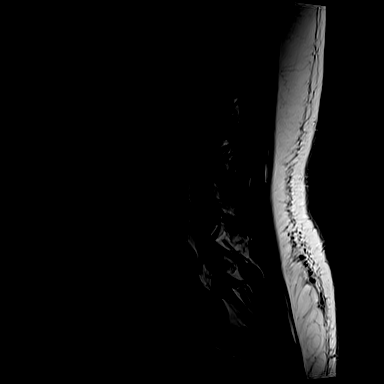
[im 16/16]
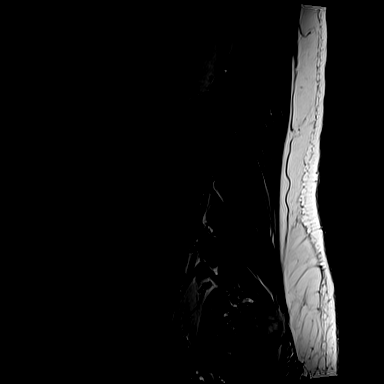

[Series 12: T2 · axial · 4.0mm · 0.28mm/px · z∈[-105,+97]mm · 6 of 42 slices shown (2 of 2)]
[im 1/42]
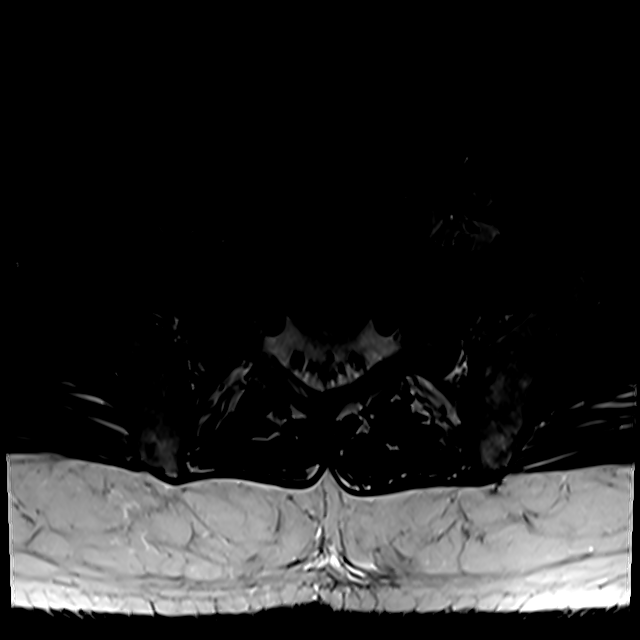
[im 6/42]
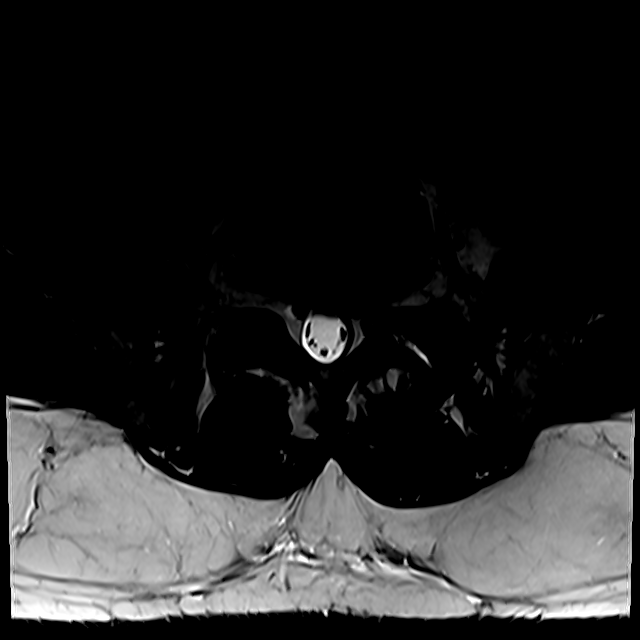
[im 12/42]
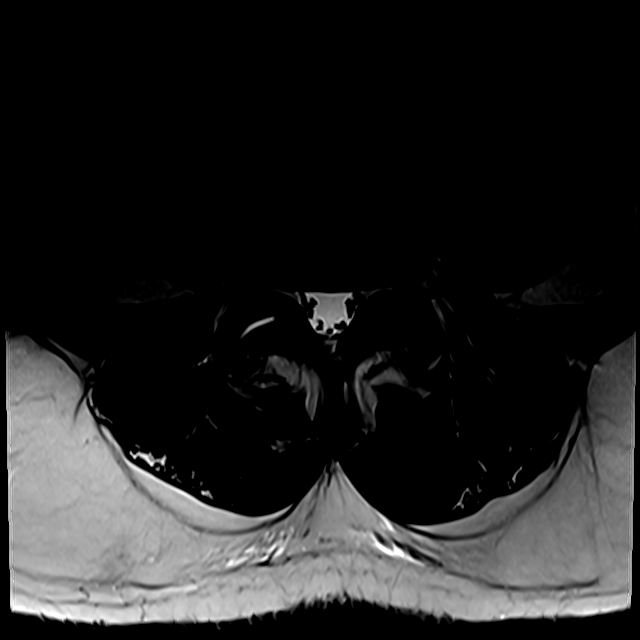
[im 18/42]
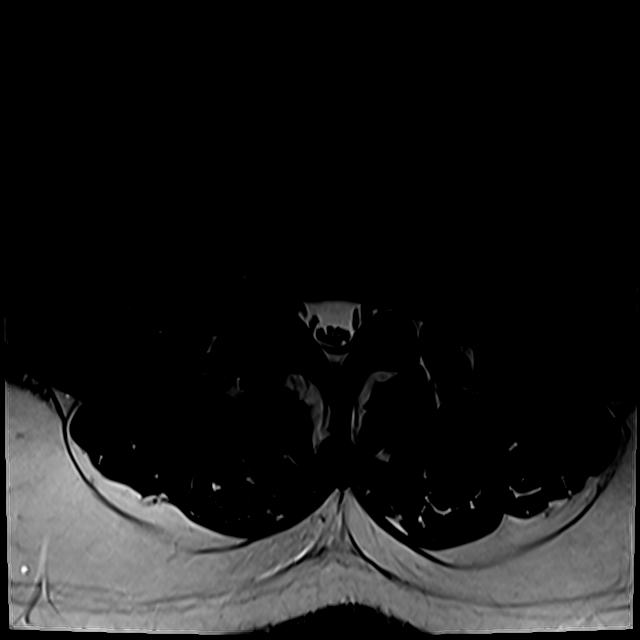
[im 21/42]
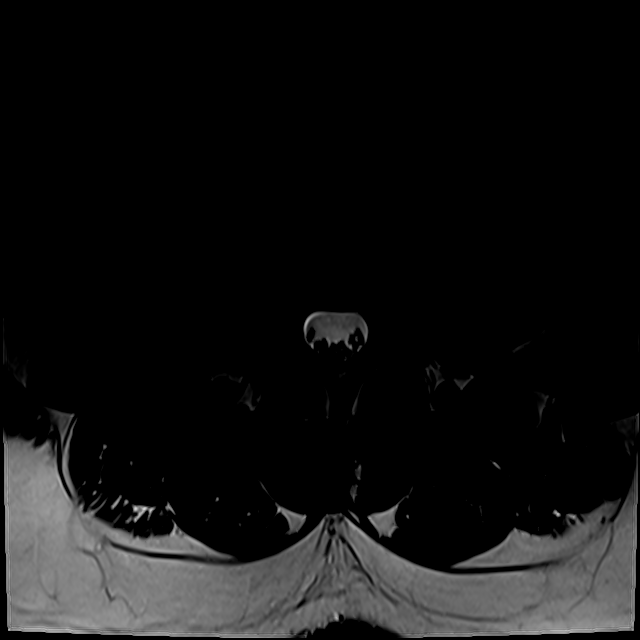
[im 36/42]
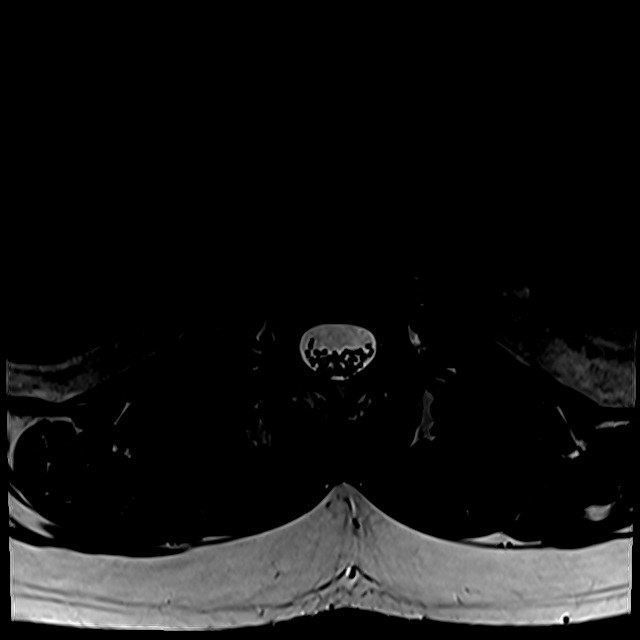

[Series 100: T1 · axial · 4.0mm · 0.28mm/px · z∈[-81,+97]mm · 3 of 42 slices shown (2 of 2)]
[im 6/42]
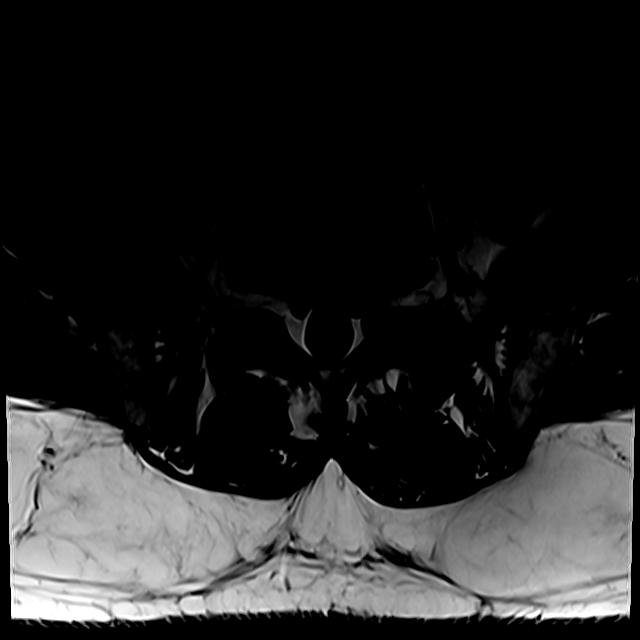
[im 21/42]
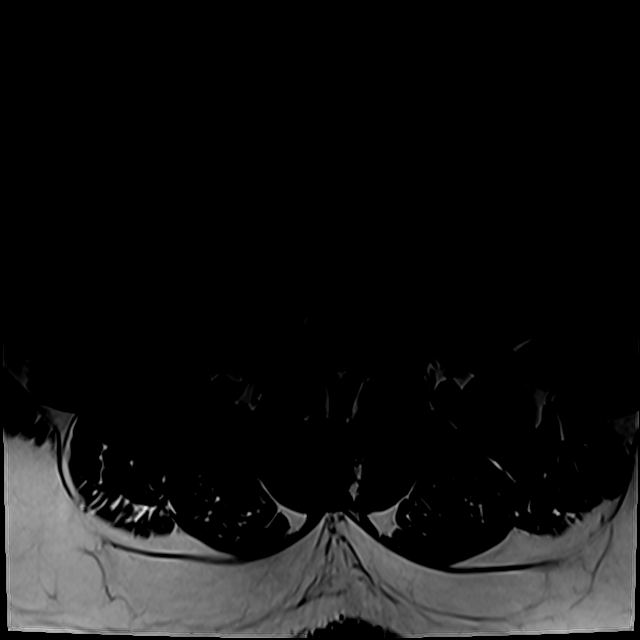
[im 36/42]
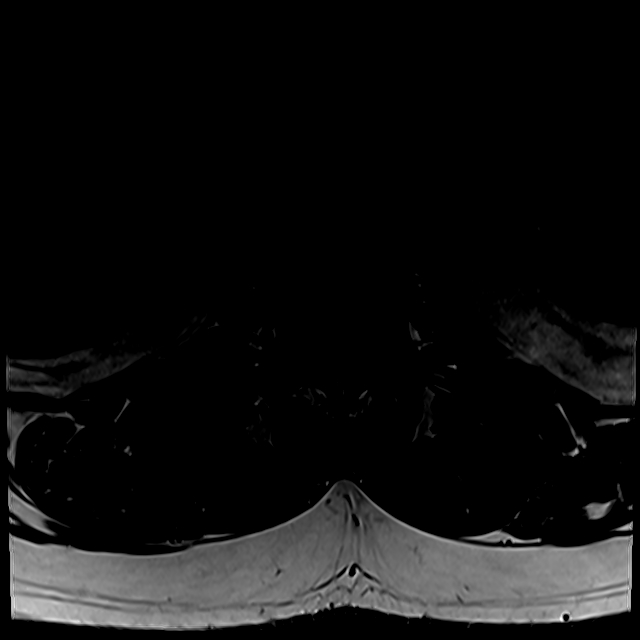

[18 of 48 positions shown; findings below may reference images not displayed]

FINDINGS: Segmentation:  Normal on the comparisons.

Alignment: Stable lumbar lordosis. There is subtle degenerative
appearing retrolisthesis of L1 on L2.

Vertebrae: No marrow edema or evidence of acute osseous abnormality.
Visualized bone marrow signal is within normal limits. Intact
visible sacrum.

Conus medullaris and cauda equina: Conus extends to the T12-L1
level. No lower spinal cord or conus signal abnormality. Capacious
spinal canal. Normal cauda equina nerve roots.

Paraspinal and other soft tissues: Negative.

Disc levels:

T11-T12: Minor disc space loss and disc bulging.  No stenosis.

T12-L1: Subtle left lateral recess disc bulge or protrusion on
series 12, image 4. No stenosis.

L1-L2: Disc space loss and desiccation with mild retrolisthesis and
circumferential disc bulge. But there is no significant stenosis.

L2-L3:  Negative.

L3-L4: Minor disc bulging and endplate spurring. Borderline to mild
left L3 neural foraminal stenosis.

L4-L5: Negative disc. But moderate facet and ligament flavum
hypertrophy with degenerative facet joint fluid on the right. Small
posteriorly situated synovial cysts which should not cause neural
compromise. No spinal or lateral recess stenosis. No convincing
foraminal stenosis.

L5-S1: Negative disc. Moderate facet hypertrophy greater on the left
with degenerative facet joint fluid on that side. Posteriorly
situated synovial cysts which should not cause neural compromise. No
stenosis.
IMPRESSION: 1. The dominant degenerative finding is facet arthropathy at both
L4-L5 and L5-S1. No significant spondylolisthesis.
2. Lumbar disc degeneration primarily at L1-L2. No lumbar spinal
stenosis or convincing neural impingement.

## 2021-05-12 ENCOUNTER — Encounter (HOSPITAL_COMMUNITY): Payer: Self-pay | Admitting: Emergency Medicine

## 2021-05-12 ENCOUNTER — Emergency Department (HOSPITAL_COMMUNITY)
Admission: EM | Admit: 2021-05-12 | Discharge: 2021-05-12 | Discharge status: Against Medical Advice | Payer: Medicare Other | Attending: Emergency Medicine | Admitting: Emergency Medicine

## 2021-05-12 DIAGNOSIS — M545 Low back pain, unspecified: Secondary | ICD-10-CM | POA: Diagnosis present

## 2021-05-12 DIAGNOSIS — Z5321 Procedure and treatment not carried out due to patient leaving prior to being seen by health care provider: Secondary | ICD-10-CM | POA: Insufficient documentation

## 2021-05-12 DIAGNOSIS — R195 Other fecal abnormalities: Secondary | ICD-10-CM | POA: Insufficient documentation

## 2021-05-12 LAB — CBC
HCT: 36.2 % — ABNORMAL LOW (ref 39.0–52.0)
Hemoglobin: 11.8 g/dL — ABNORMAL LOW (ref 13.0–17.0)
MCH: 30.2 pg (ref 26.0–34.0)
MCHC: 32.6 g/dL (ref 30.0–36.0)
MCV: 92.6 fL (ref 80.0–100.0)
Platelets: 252 10*3/uL (ref 150–400)
RBC: 3.91 MIL/uL — ABNORMAL LOW (ref 4.22–5.81)
RDW: 14.9 % (ref 11.5–15.5)
WBC: 9.7 10*3/uL (ref 4.0–10.5)
nRBC: 0 % (ref 0.0–0.2)

## 2021-05-12 LAB — COMPREHENSIVE METABOLIC PANEL
ALT: 25 U/L (ref 0–44)
AST: 19 U/L (ref 15–41)
Albumin: 3.8 g/dL (ref 3.5–5.0)
Alkaline Phosphatase: 77 U/L (ref 38–126)
Anion gap: 8 (ref 5–15)
BUN: 23 mg/dL (ref 8–23)
CO2: 23 mmol/L (ref 22–32)
Calcium: 9 mg/dL (ref 8.9–10.3)
Chloride: 106 mmol/L (ref 98–111)
Creatinine, Ser: 1.38 mg/dL — ABNORMAL HIGH (ref 0.61–1.24)
GFR, Estimated: 56 mL/min — ABNORMAL LOW (ref >60)
Glucose, Bld: 111 mg/dL — ABNORMAL HIGH (ref 70–99)
Potassium: 4.2 mmol/L (ref 3.5–5.1)
Sodium: 137 mmol/L (ref 135–145)
Total Bilirubin: 0.4 mg/dL (ref 0.3–1.2)
Total Protein: 7.9 g/dL (ref 6.5–8.1)

## 2021-05-12 LAB — TYPE AND SCREEN
ABO/RH(D): O POS
Antibody Screen: NEGATIVE

## 2021-05-12 NOTE — ED Triage Notes (Signed)
Per pt, states he had a BM this am-saw some blood in it-also complaining of left lower back pain radiating down left leg-states it has been going on since he had back surgery in November ?

## 2021-05-20 ENCOUNTER — Encounter: Payer: Self-pay | Admitting: Gastroenterology

## 2021-05-20 ENCOUNTER — Ambulatory Visit: Payer: Medicare Other | Admitting: Gastroenterology

## 2021-05-20 VITALS — BP 130/82 | HR 94 | Ht 72.0 in | Wt 236.2 lb

## 2021-05-20 DIAGNOSIS — R195 Other fecal abnormalities: Secondary | ICD-10-CM

## 2021-05-20 DIAGNOSIS — D649 Anemia, unspecified: Secondary | ICD-10-CM

## 2021-05-20 MED ORDER — NA SULFATE-K SULFATE-MG SULF 17.5-3.13-1.6 GM/177ML PO SOLN: 1.0000 | Freq: Once | ORAL | 0 refills | Status: AC

## 2021-05-20 NOTE — Progress Notes (Signed)
I agree with the above note, plan 

## 2021-05-20 NOTE — Progress Notes (Signed)
? ? ? ?05/20/2021 ?[name] ?[id number] ?[birth year] ? ? ?HISTORY OF PRESENT ILLNESS:  This is a 66 year old male who was previously a patient of Dr. Nichola Sizer.  His last colonoscopy was 10/2011.  At that time he had four polyps removed that were hyperplastic with prolapse-type polyps.  Sigmoid diverticulosis as well.  He is here today at the request of his PCP, Dr. Brigitte Pulse, for evaluation of "blood in stool".  Patient says that he had a physical with his PCP in March and had a positive Hemosure on 04/21/21.  Then last week he had some bright red blood on the toilet paper when he had a BM.  Overall moves his bowels well.  Has some intermittent generalized abdominal discomfort.  Has intermittent issues with acid reflux for which he only takes omeprazole 20 mg as needed, he says about maybe 2 times a week.  He takes ASA 81 mg regularly, but recently his been treated for gout so he stopped that while being treated with colchicine, etc.  He also just finished a short course of prednisone for his gout.  Of note, his hemoglobin is 11.8 g on most recent labs 8 days ago.  MCV is normal at 92.6. ?This is trended down slowly since September 2021.  Never had an EGD in the past. ? ?Past Medical History:  ?Diagnosis Date  ? Dyslipidemia   ? History of gout   ? Hypertension   ? Normal coronary arteries 12/2007  ? ?Past Surgical History:  ?Procedure Laterality Date  ? CARDIAC CATHETERIZATION  12/2007  ? normal coronaries  ? ? reports that he has been smoking cigarettes. He has been smoking an average of 1 pack per day. He has never used smokeless tobacco. He reports that he does not drink alcohol and does not use drugs. ?family history includes Alcohol abuse in his father; Colon cancer in his cousin and maternal uncle; Dementia in his mother. ?No Known Allergies ? ?  ?Outpatient Encounter Medications as of 05/20/2021  ?Medication Sig  ? allopurinol (ZYLOPRIM) 100 MG tablet Take 100 mg by mouth daily.  ? aspirin EC 81 MG EC tablet  Take 1 tablet (81 mg total) by mouth daily.  ? atorvastatin (LIPITOR) 40 MG tablet Take 1 tablet (40 mg total) by mouth daily at 6 PM.  ? colchicine 0.6 MG tablet Take 0.6 mg by mouth 3 (three) times daily as needed.  ? HYDROcodone-acetaminophen (NORCO/VICODIN) 5-325 MG tablet Take 1 tablet by mouth every 6 (six) hours as needed.  ? lisinopril-hydrochlorothiazide (PRINZIDE,ZESTORETIC) 20-12.5 MG per tablet Take 1 tablet by mouth Daily.  ? Multiple Vitamins-Minerals (MULTIVITAMIN WITH MINERALS) tablet Take 1 tablet by mouth daily.  ? Na Sulfate-K Sulfate-Mg Sulf 17.5-3.13-1.6 GM/177ML SOLN Take 1 kit by mouth once for 1 dose.  ? omeprazole (PRILOSEC OTC) 20 MG tablet Take 20 mg by mouth daily as needed (acid reflux).  ? rosuvastatin (CRESTOR) 10 MG tablet Take 10 mg by mouth daily.  ? benzonatate (TESSALON) 100 MG capsule Take 1 capsule (100 mg total) by mouth 3 (three) times daily as needed for cough. (Patient not taking: Reported on 05/20/2021)  ? diclofenac sodium (VOLTAREN) 1 % GEL Apply 2 g topically 4 (four) times daily. (Patient not taking: Reported on 03/06/2017)  ? meloxicam (MOBIC) 15 MG tablet Take 15 mg by mouth daily as needed for pain. (Patient not taking: Reported on 05/20/2021)  ? methocarbamol (ROBAXIN) 500 MG tablet Take 1 tablet (500 mg total) by mouth  2 (two) times daily. (Patient not taking: Reported on 10/02/2019)  ? ondansetron (ZOFRAN) 4 MG tablet Take 1 tablet (4 mg total) by mouth every 6 (six) hours. (Patient not taking: Reported on 05/20/2021)  ? predniSONE (DELTASONE) 20 MG tablet Take 2 tablets (40 mg total) by mouth daily. (Patient not taking: Reported on 05/20/2021)  ? [DISCONTINUED] lidocaine (LIDODERM) 5 % Place 1 patch onto the skin daily. Remove & Discard patch within 12 hours or as directed by MD (Patient not taking: Reported on 10/02/2019)  ? [DISCONTINUED] oxyCODONE (OXY IR/ROXICODONE) 5 MG immediate release tablet Take 5 mg by mouth every 6 (six) hours as needed for moderate pain.  (Patient not taking: Reported on 05/20/2021)  ? ?No facility-administered encounter medications on file as of 05/20/2021.  ? ? ?REVIEW OF SYSTEMS  : All other systems reviewed and negative except where noted in the History of Present Illness. ? ? ?PHYSICAL EXAM: ?BP 130/82   Pulse 94   Ht 6' (1.829 m)   Wt 236 lb 3.2 oz (107.1 kg)   SpO2 96%   BMI 32.03 kg/m?  ?General: Well developed AA male in no acute distress ?Head: Normocephalic and atraumatic ?Eyes: Sclerae anicteric, conjunctiva pink. ?Ears: Normal auditory acuity ?Lungs: Clear throughout to auscultation; no W/R/R. ?Heart: Regular rate and rhythm; no M/R/G. ?Abdomen: Soft, non-distended.  BS present.  Non-tender. ?Rectal:  Will be done at the time of colonoscopy. ?Musculoskeletal: Symmetrical with no gross deformities  ?Skin: No lesions on visible extremities ?Extremities: No edema  ?Neurological: Alert oriented x 4, grossly non-focal ?Psychological:  Alert and cooperative. Normal mood and affect ? ?ASSESSMENT AND PLAN: ?*Positive fecal immunochemistry with normocytic anemia:  Had positive Hemosure 04/21/21 but then had some bright red blood with BM one day last week.  Hgb 11.8 grams with normal MCV, has been trending down slowly over the past year and a half.  Takes ASA 81 mg daily but then also recently had course of prednisone for gout.  Long-standing intermittent heartburn/reflux for which he takes omeprazole prn.  No EGD in the past.  Last colonoscopy 10/2011.  I think is probably a good idea to proceed with both an EGD and colonoscopy.  Will schedule with Dr. Ardis Hughs.  The risks, benefits, and alternatives to EGD and colonoscopy were discussed with the patient and he consents to proceed.  ? ?CC:  Ginger Organ., MD ? ?  ?

## 2021-05-20 NOTE — Patient Instructions (Signed)
You have been scheduled for an endoscopy and colonoscopy. Please follow the written instructions given to you at your visit today. ?Please pick up your prep supplies at the pharmacy within the next 1-3 days. ?If you use inhalers (even only as needed), please bring them with you on the day of your procedure. ? ?If you are age 67 or older, your body mass index should be between 23-30. Your Body mass index is 32.03 kg/m?Marland Kitchen If this is out of the aforementioned range listed, please consider follow up with your Primary Care Provider. ? ?If you are age 39 or younger, your body mass index should be between 19-25. Your Body mass index is 32.03 kg/m?Marland Kitchen If this is out of the aformentioned range listed, please consider follow up with your Primary Care Provider.  ? ?________________________________________________________ ? ?The [facility] providers would like to encourage you to use [facility] to communicate with providers for non-urgent requests or questions.  Due to long hold times on the telephone, sending your provider a message by [facility] may be a faster and more efficient way to get a response.  Please allow 48 business hours for a response.  Please remember that this is for non-urgent requests.  ?_______________________________________________________ ? ?

## 2021-05-25 ENCOUNTER — Ambulatory Visit
Admission: RE | Admit: 2021-05-25 | Discharge: 2021-05-25 | Disposition: A | Discharge status: Home / Self Care | Payer: Medicare Other | Source: Ambulatory Visit | Attending: Internal Medicine | Admitting: Internal Medicine

## 2021-05-25 DIAGNOSIS — F17211 Nicotine dependence, cigarettes, in remission: Secondary | ICD-10-CM

## 2021-05-25 IMAGING — CT CT CHEST LUNG CANCER SCREENING LOW DOSE W/O CM
1 of 3 series · 15 of 40 positions shown, 19 images · non-contrast
Comparison: [DATE].

CLINICAL DATA: Former smoker, quit 1 year ago, 40 pack-year
history.



[Series 6: super d · axial · 0.92mm/px · z∈[-410,-76]mm · 15 of 458 slices shown, 19 images]
[im 20/458  mediastinal]
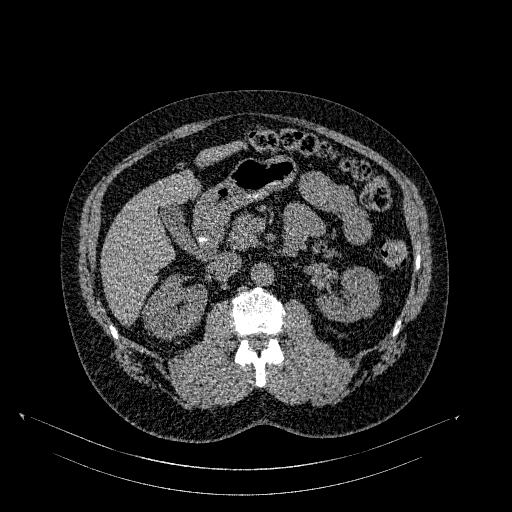
[im 20/458  lung]
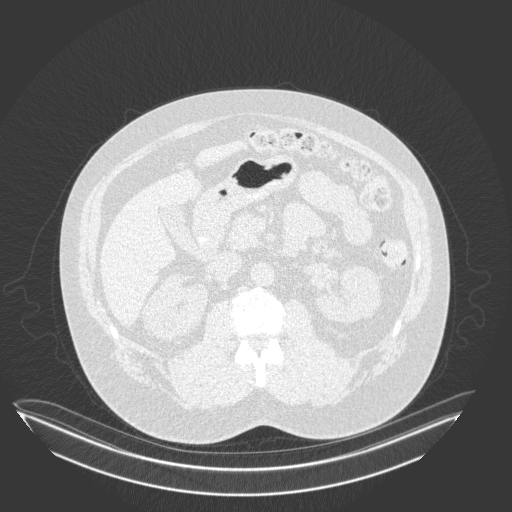
[im 60/458  lung]
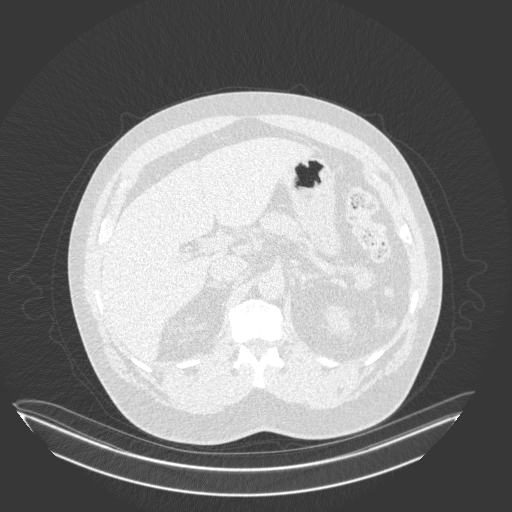
[im 100/458  lung]
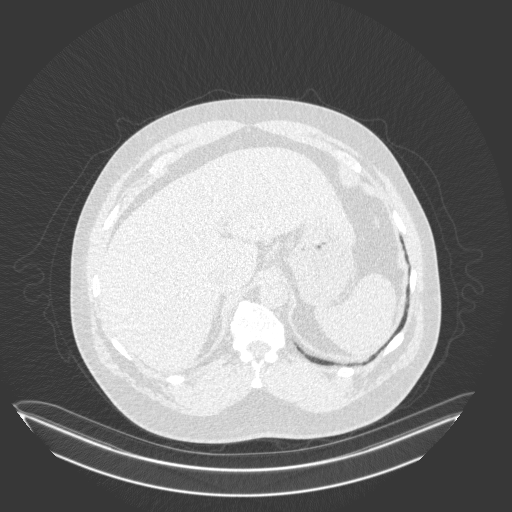
[im 120/458  lung]
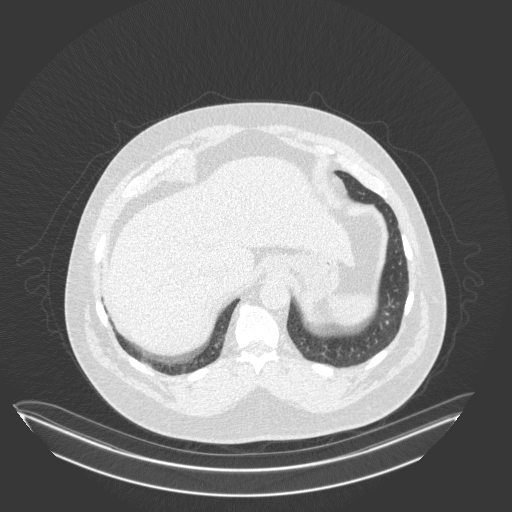
[im 153/458  mediastinal]
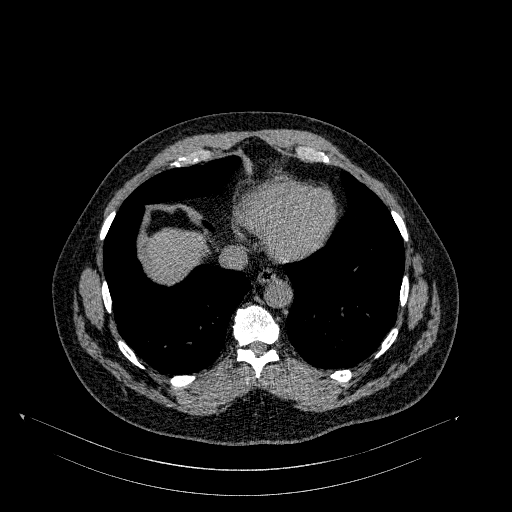
[im 153/458  lung]
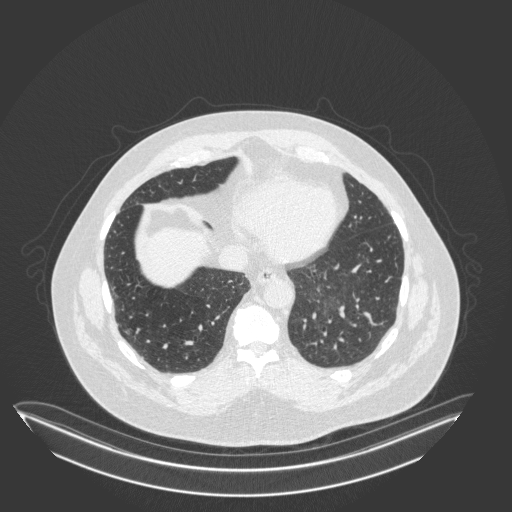
[im 179/458  lung]
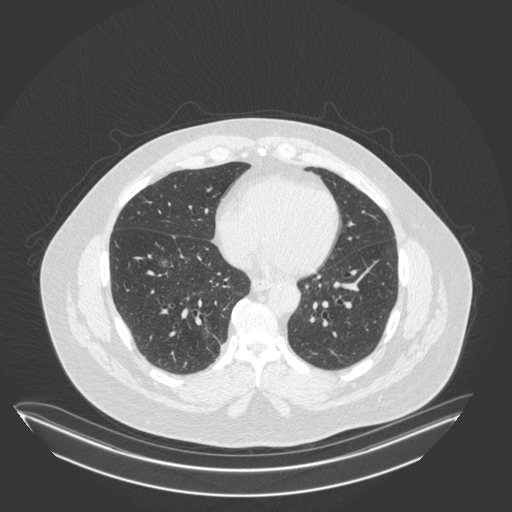
[im 199/458  lung]
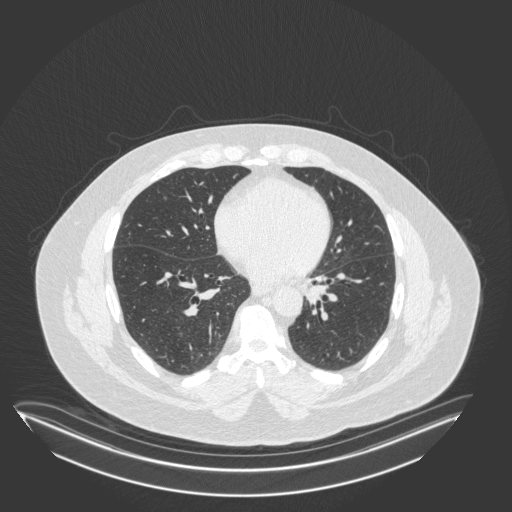
[im 219/458  lung]
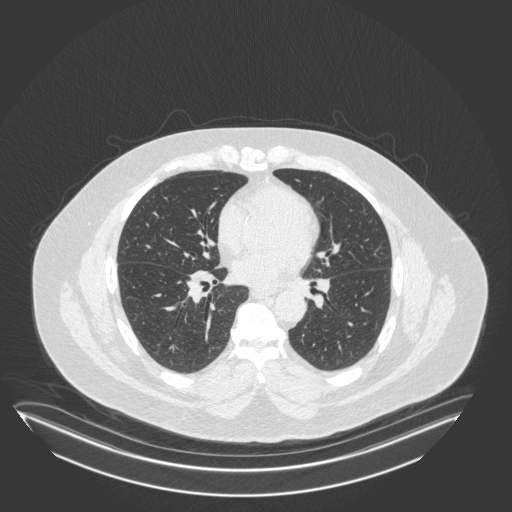
[im 259/458  mediastinal]
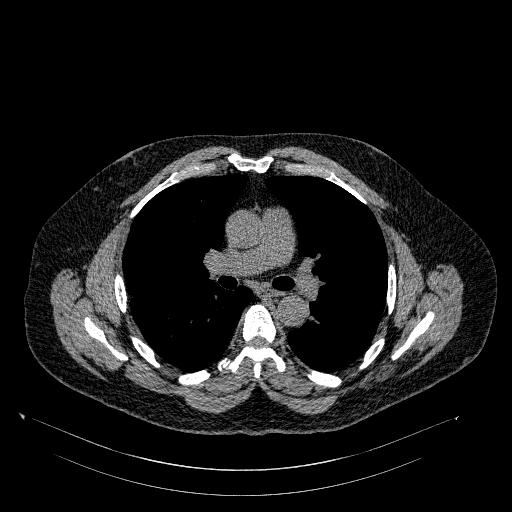
[im 259/458  lung]
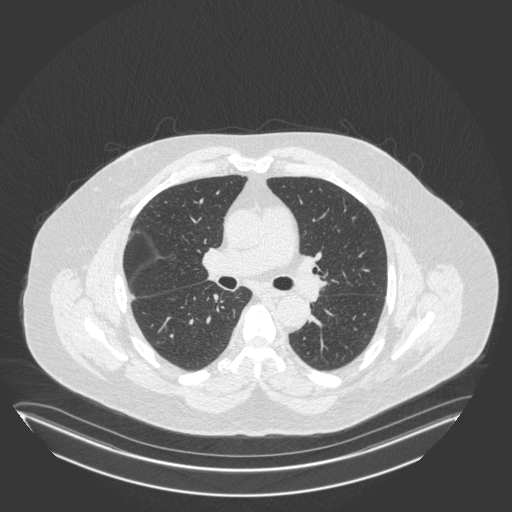
[im 279/458  lung]
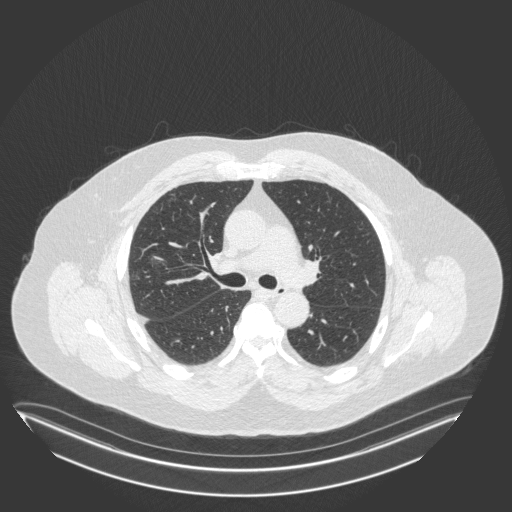
[im 305/458  lung]
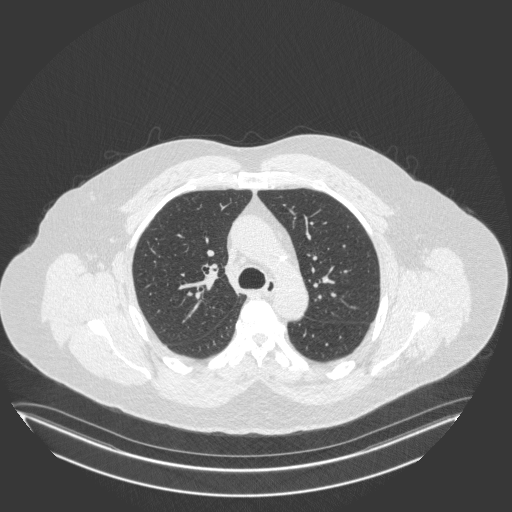
[im 338/458  lung]
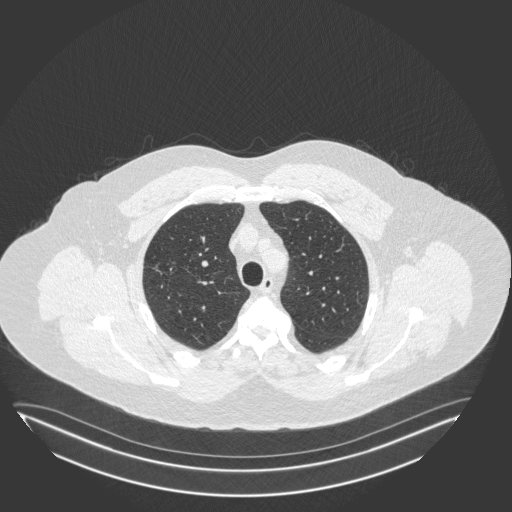
[im 358/458  mediastinal]
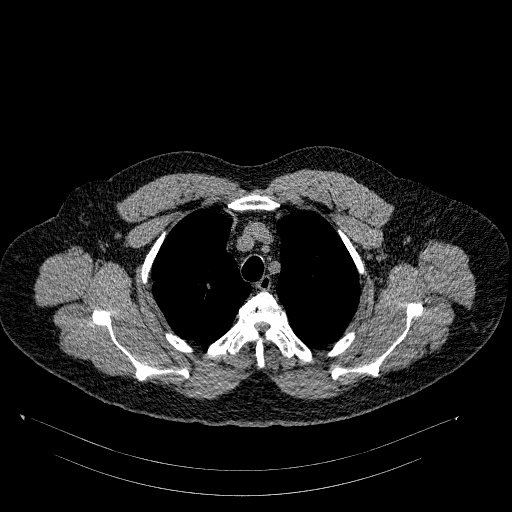
[im 358/458  lung]
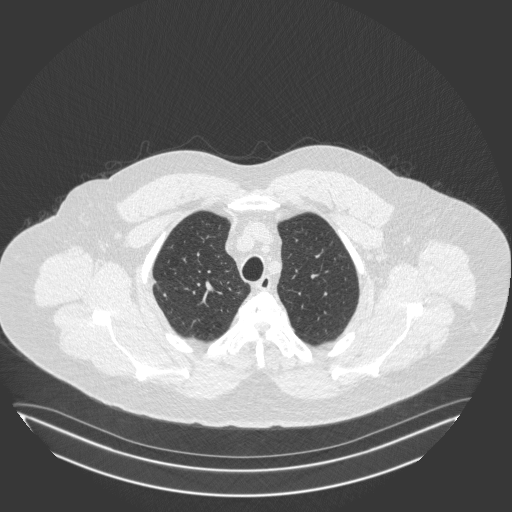
[im 398/458  lung]
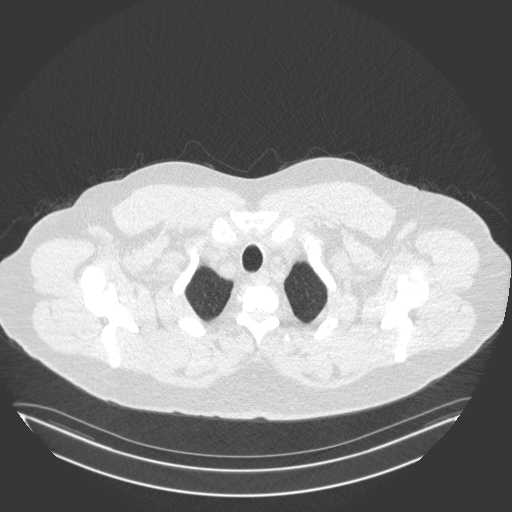
[im 438/458  lung]
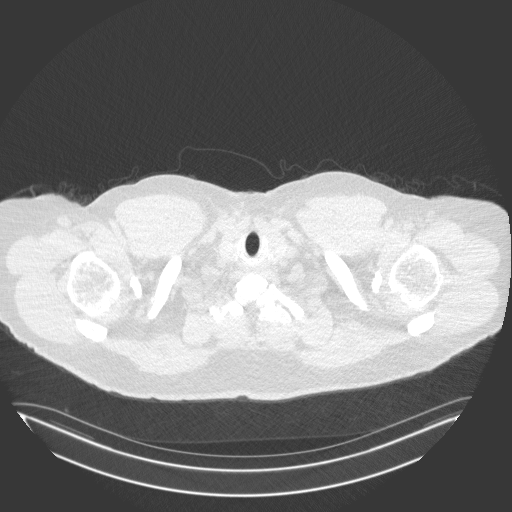

[15 of 40 positions shown; findings below may reference images not displayed]

FINDINGS: Cardiovascular: Atherosclerotic calcification of the aorta, aortic
valve and coronary arteries. Heart size normal. No pericardial
effusion.

Mediastinum/Nodes: No pathologically enlarged mediastinal or
axillary lymph nodes. Hilar regions are difficult to definitively
evaluate without IV contrast. Esophagus is grossly unremarkable.

Lungs/Pleura: Mild emphysema. Calcified granulomas. Residual smoking
related respiratory bronchiolitis. No suspicious pulmonary nodules.
No pleural fluid. Airway is unremarkable.

Upper Abdomen: Visualized portions of the liver, gallbladder and
adrenal glands are unremarkable. 1.4 cm low-attenuation lesion in
the right kidney is indicative of a cyst. No follow-up necessary.
Visualized portions of the kidneys, spleen, pancreas, stomach and
bowel are otherwise unremarkable.

Musculoskeletal: Degenerative changes in the spine. Old left rib
fracture.
IMPRESSION: 1. Lung-RADS 1, negative. Continue annual screening with low-dose
chest CT without contrast in 12 months.
2. Aortic atherosclerosis ([LU]-[LU]). Coronary artery
calcification.
3.  Emphysema ([LU]-[LU]).

## 2021-06-15 ENCOUNTER — Encounter: Payer: Self-pay | Admitting: Gastroenterology

## 2021-06-24 ENCOUNTER — Ambulatory Visit (AMBULATORY_SURGERY_CENTER): Payer: Medicare Other | Admitting: Gastroenterology

## 2021-06-24 ENCOUNTER — Encounter: Payer: Self-pay | Admitting: Gastroenterology

## 2021-06-24 ENCOUNTER — Other Ambulatory Visit: Payer: Self-pay | Admitting: Gastroenterology

## 2021-06-24 VITALS — BP 114/81 | HR 84 | Temp 97.7°F | Resp 13 | Ht 72.0 in | Wt 230.0 lb

## 2021-06-24 DIAGNOSIS — B9681 Helicobacter pylori [H. pylori] as the cause of diseases classified elsewhere: Secondary | ICD-10-CM

## 2021-06-24 DIAGNOSIS — K648 Other hemorrhoids: Secondary | ICD-10-CM

## 2021-06-24 DIAGNOSIS — R195 Other fecal abnormalities: Secondary | ICD-10-CM | POA: Diagnosis not present

## 2021-06-24 DIAGNOSIS — K259 Gastric ulcer, unspecified as acute or chronic, without hemorrhage or perforation: Secondary | ICD-10-CM

## 2021-06-24 DIAGNOSIS — K5730 Diverticulosis of large intestine without perforation or abscess without bleeding: Secondary | ICD-10-CM

## 2021-06-24 DIAGNOSIS — D649 Anemia, unspecified: Secondary | ICD-10-CM

## 2021-06-24 DIAGNOSIS — K2970 Gastritis, unspecified, without bleeding: Secondary | ICD-10-CM

## 2021-06-24 MED ORDER — SODIUM CHLORIDE 0.9 % IV SOLN: 500.0000 mL | Freq: Once | INTRAVENOUS | Status: DC

## 2021-06-24 NOTE — Progress Notes (Signed)
Called to room to assist during endoscopic procedure.  Patient ID and intended procedure confirmed with present staff. Received instructions for my participation in the procedure from the performing physician.  

## 2021-06-24 NOTE — Progress Notes (Signed)
To pacu, VSS. Report to [email or web address] 

## 2021-06-24 NOTE — Op Note (Signed)
[facility] Patient Name: [name] Procedure Date: 06/24/2021 2:11 PM MRN: [id number] Endoscopist: Milus Banister , MD Age: 65 Referring MD:  Date of Birth: [birth year] Gender: Male Account #: [id number] Procedure:                Colonoscopy Indications:              Positive fecal immunochemical test, mild normocytic                            anemia Medicines:                Monitored Anesthesia Care Procedure:                Pre-Anesthesia Assessment:                           - Prior to the procedure, a History and Physical                            was performed, and patient medications and                            allergies were reviewed. The patient's tolerance of                            previous anesthesia was also reviewed. The risks                            and benefits of the procedure and the sedation                            options and risks were discussed with the patient.                            All questions were answered, and informed consent                            was obtained. Prior Anticoagulants: The patient has                            taken no previous anticoagulant or antiplatelet                            agents. ASA Grade Assessment: II - A patient with                            mild systemic disease. After reviewing the risks                            and benefits, the patient was deemed in                            satisfactory condition to undergo the procedure.  After obtaining informed consent, the colonoscope                            was passed under direct vision. Throughout the                            procedure, the patient's blood pressure, pulse, and                            oxygen saturations were monitored continuously. The                            Colonoscope was introduced through the anus and                            advanced to the the cecum, identified by                             appendiceal orifice and ileocecal valve. The                            colonoscopy was performed without difficulty. The                            patient tolerated the procedure well. The quality                            of the bowel preparation was good. The ileocecal                            valve, appendiceal orifice, and rectum were                            photographed. Scope In: 2:14:12 PM Scope Out: 2:23:39 PM Scope Withdrawal Time: 0 hours 7 minutes 39 seconds  Total Procedure Duration: 0 hours 9 minutes 27 seconds  Findings:                 Multiple small-mouthed diverticula were found in                            the left colon.                           Internal hemorrhoids were found. The hemorrhoids                            were small.                           The exam was otherwise without abnormality on                            direct and retroflexion views. Complications:            No immediate complications. Estimated blood loss:  None. Estimated Blood Loss:     Estimated blood loss: none. Impression:               - Diverticulosis in the left colon.                           - Internal hemorrhoids.                           - The examination was otherwise normal on direct                            and retroflexion views.                           - No polyps or cancers. Recommendation:           - Repeat colonoscopy in 10 years for screening.                           - EGD now. Milus Banister, MD 06/24/2021 2:27:20 PM This report has been signed electronically.

## 2021-06-24 NOTE — Progress Notes (Signed)
HPI: This is a hemocult + anemia (mild, normocytic)   ROS: complete GI ROS as described in HPI, all other review negative.  Constitutional:  No unintentional weight loss   Past Medical History:  Diagnosis Date   Dyslipidemia    History of gout    Hypertension    Normal coronary arteries 12/2007    Past Surgical History:  Procedure Laterality Date   CARDIAC CATHETERIZATION  12/2007   normal coronaries    Current Outpatient Medications  Medication Sig Dispense Refill   allopurinol (ZYLOPRIM) 100 MG tablet Take 100 mg by mouth daily.     aspirin EC 81 MG EC tablet Take 1 tablet (81 mg total) by mouth daily. 30 tablet 0   colchicine 0.6 MG tablet Take 0.6 mg by mouth 3 (three) times daily as needed.     lisinopril-hydrochlorothiazide (PRINZIDE,ZESTORETIC) 20-12.5 MG per tablet Take 1 tablet by mouth Daily.     Multiple Vitamins-Minerals (MULTIVITAMIN WITH MINERALS) tablet Take 1 tablet by mouth daily.     omeprazole (PRILOSEC OTC) 20 MG tablet Take 20 mg by mouth daily as needed (acid reflux).     atorvastatin (LIPITOR) 40 MG tablet Take 1 tablet (40 mg total) by mouth daily at 6 PM. 30 tablet 0   benzonatate (TESSALON) 100 MG capsule Take 1 capsule (100 mg total) by mouth 3 (three) times daily as needed for cough. (Patient not taking: Reported on 05/20/2021) 21 capsule 0   diclofenac sodium (VOLTAREN) 1 % GEL Apply 2 g topically 4 (four) times daily. (Patient not taking: Reported on 03/06/2017) 1 Tube 0   HYDROcodone-acetaminophen (NORCO/VICODIN) 5-325 MG tablet Take 1 tablet by mouth every 6 (six) hours as needed. 10 tablet 0   meloxicam (MOBIC) 15 MG tablet Take 15 mg by mouth daily as needed for pain. (Patient not taking: Reported on 05/20/2021)     methocarbamol (ROBAXIN) 500 MG tablet Take 1 tablet (500 mg total) by mouth 2 (two) times daily. (Patient not taking: Reported on 10/02/2019) 20 tablet 0   ondansetron (ZOFRAN) 4 MG tablet Take 1 tablet (4 mg total) by mouth every 6 (six)  hours. (Patient not taking: Reported on 05/20/2021) 12 tablet 0   predniSONE (DELTASONE) 20 MG tablet Take 2 tablets (40 mg total) by mouth daily. (Patient not taking: Reported on 05/20/2021) 10 tablet 0   rosuvastatin (CRESTOR) 10 MG tablet Take 10 mg by mouth daily.     Current Facility-Administered Medications  Medication Dose Route Frequency Provider Last Rate Last Admin   0.9 %  sodium chloride infusion  500 mL Intravenous Once Milus Banister, MD        Allergies as of 06/24/2021   (No Known Allergies)    Family History  Problem Relation Age of Onset   Dementia Mother    Alcohol abuse Father    Colon cancer Maternal Uncle    Colon cancer Cousin    Stomach cancer Neg Hx    Esophageal cancer Neg Hx    Colon polyps Neg Hx     Social History   Socioeconomic History   Marital status: Married    Spouse name: Not on file   Number of children: 6   Years of education: Not on file   Highest education level: Not on file  Occupational History   Occupation: Forensic scientist  Tobacco Use   Smoking status: Every Day    Packs/day: 1.00    Types: Cigarettes   Smokeless tobacco: Never  Vaping  Use   Vaping Use: Never used  Substance and Sexual Activity   Alcohol use: No   Drug use: No   Sexual activity: Not on file  Other Topics Concern   Not on file  Social History Narrative   Not on file   Social Determinants of Health   Financial Resource Strain: Not on file  Food Insecurity: Not on file  Transportation Needs: Not on file  Physical Activity: Not on file  Stress: Not on file  Social Connections: Not on file  Intimate Partner Violence: Not on file     Physical Exam: BP (!) 94/56   Pulse 84   Temp 97.7 F (36.5 C)   Ht 6' (1.829 m)   Wt 230 lb (104.3 kg)   SpO2 98%   BMI 31.19 kg/m  Constitutional: generally well-appearing Psychiatric: alert and oriented x3 Lungs: CTA bilaterally Heart: no MCR  Assessment and plan: 66 y.o. male with hemocult +  stool, anemia (normocytic, minor)  Colonoscopy and EGD today  Care is appropriate for the ambulatory setting.  Owens Loffler, MD [facility] 06/24/2021, 2:02 PM

## 2021-06-24 NOTE — Op Note (Signed)
[facility] Patient Name: [name] Procedure Date: 06/24/2021 2:10 PM MRN: [id number] Endoscopist: Milus Banister , MD Age: 66 Referring MD:  Date of Birth: Mar 23, 1955 Gender: Male Account #: [id number] Procedure:                Upper GI endoscopy Indications:              FIT + stool, anemia (normocytic) Medicines:                Monitored Anesthesia Care Procedure:                Pre-Anesthesia Assessment:                           - Prior to the procedure, a History and Physical                            was performed, and patient medications and                            allergies were reviewed. The patient's tolerance of                            previous anesthesia was also reviewed. The risks                            and benefits of the procedure and the sedation                            options and risks were discussed with the patient.                            All questions were answered, and informed consent                            was obtained. Prior Anticoagulants: The patient has                            taken no previous anticoagulant or antiplatelet                            agents. ASA Grade Assessment: II - A patient with                            mild systemic disease. After reviewing the risks                            and benefits, the patient was deemed in                            satisfactory condition to undergo the procedure.                           After obtaining informed consent, the endoscope was  passed under direct vision. Throughout the                            procedure, the patient's blood pressure, pulse, and                            oxygen saturations were monitored continuously. The                            GIF [id number] #[id number] was introduced through the                            mouth, and advanced to the second part of duodenum.                            The upper GI endoscopy was  accomplished without                            difficulty. The patient tolerated the procedure                            well. Scope In: Scope Out: Findings:                 1-2cm area of focal gastritis (erythema, erosions)                            in the gastric fundus. Biopsies were taken with a                            cold forceps for histology.                           The exam was otherwise without abnormality. Complications:            No immediate complications. Estimated blood loss:                            None. Estimated Blood Loss:     Estimated blood loss: none. Impression:               - 1-2cm area of focal gastritis (erythema,                            erosions) in the gastric fundus. Biopsies were                            taken with a cold forceps for histology.                           - The examination was otherwise normal. Recommendation:           - Patient has a contact number available for                            emergencies. The signs and symptoms of potential  delayed complications were discussed with the                            patient. Return to normal activities tomorrow.                            Written discharge instructions were provided to the                            patient.                           - Resume previous diet.                           - Continue present medications.                           - Await pathology results. Milus Banister, MD 06/24/2021 2:35:10 PM This report has been signed electronically.

## 2021-06-24 NOTE — Progress Notes (Signed)
Pt's states no medical or surgical changes since previsit or office visit. 

## 2021-06-24 NOTE — Patient Instructions (Signed)
Thank you for letting us take care of your healthcare needs today. Please see handout given to you on Gastritis, Hemorrhoids and Diverticulosis.    YOU HAD AN ENDOSCOPIC PROCEDURE TODAY AT [facility]:   Refer to the procedure report that was given to you for any specific questions about what was found during the examination.  If the procedure report does not answer your questions, please call your gastroenterologist to clarify.  If you requested that your care partner not be given the details of your procedure findings, then the procedure report has been included in a sealed envelope for you to review at your convenience later.  YOU SHOULD EXPECT: Some feelings of bloating in the abdomen. Passage of more gas than usual.  Walking can help get rid of the air that was put into your GI tract during the procedure and reduce the bloating. If you had a lower endoscopy (such as a colonoscopy or flexible sigmoidoscopy) you may notice spotting of blood in your stool or on the toilet paper. If you underwent a bowel prep for your procedure, you may not have a normal bowel movement for a few days.  Please Note:  You might notice some irritation and congestion in your nose or some drainage.  This is from the oxygen used during your procedure.  There is no need for concern and it should clear up in a day or so.  SYMPTOMS TO REPORT IMMEDIATELY:  Following lower endoscopy (colonoscopy or flexible sigmoidoscopy):  Excessive amounts of blood in the stool  Significant tenderness or worsening of abdominal pains  Swelling of the abdomen that is new, acute  Fever of 100F or higher  Following upper endoscopy (EGD)  Vomiting of blood or coffee ground material  New chest pain or pain under the shoulder blades  Painful or persistently difficult swallowing  New shortness of breath  Fever of 100F or higher  Black, tarry-looking stools  For urgent or emergent issues, a gastroenterologist can be  reached at any hour by calling [phone]. Do not use MyChart messaging for urgent concerns.    DIET:  We do recommend a small meal at first, but then you may proceed to your regular diet.  Drink plenty of fluids but you should avoid alcoholic beverages for 24 hours.  ACTIVITY:  You should plan to take it easy for the rest of today and you should NOT DRIVE or use heavy machinery until tomorrow (because of the sedation medicines used during the test).    FOLLOW UP: Our staff will call the number listed on your records 48-72 hours following your procedure to check on you and address any questions or concerns that you may have regarding the information given to you following your procedure. If we do not reach you, we will leave a message.  We will attempt to reach you two times.  During this call, we will ask if you have developed any symptoms of COVID 19. If you develop any symptoms (ie: fever, flu-like symptoms, shortness of breath, cough etc.) before then, please call [phone].  If you test positive for Covid 19 in the 2 weeks post procedure, please call and report this information to [place].    If any biopsies were taken you will be contacted by phone or by letter within the next 1-3 weeks.  Please call us at [phone] if you have not heard about the biopsies in 3 weeks.    SIGNATURES/CONFIDENTIALITY: You and/or your care partner have  signed paperwork which will be entered into your electronic medical record.  These signatures attest to the fact that that the information above on your After Visit Summary has been reviewed and is understood.  Full responsibility of the confidentiality of this discharge information lies with you and/or your care-partner.

## 2021-06-25 ENCOUNTER — Telehealth: Payer: Self-pay

## 2021-06-25 ENCOUNTER — Telehealth: Payer: Self-pay | Admitting: *Deleted

## 2021-06-25 NOTE — Telephone Encounter (Signed)
  Follow up Call-     06/24/2021    1:41 PM  Call back number  Post procedure Call Back phone  # [phone]  Permission to leave phone message Yes     Patient questions:  Do you have a fever, pain , or abdominal swelling? No. Pain Score  0 *  Have you tolerated food without any problems? Yes.    Have you been able to return to your normal activities? Yes.    Do you have any questions about your discharge instructions: Diet   No. Medications  No. Follow up visit  No.  Do you have questions or concerns about your Care? No.  Actions: * If pain score is 4 or above: No action needed, pain <4.

## 2021-06-25 NOTE — Telephone Encounter (Signed)
Left message on f/u call 

## 2021-07-14 ENCOUNTER — Other Ambulatory Visit: Payer: Self-pay | Admitting: Gastroenterology

## 2021-07-14 ENCOUNTER — Other Ambulatory Visit: Payer: Self-pay

## 2021-07-14 ENCOUNTER — Telehealth: Payer: Self-pay | Admitting: Gastroenterology

## 2021-07-14 MED ORDER — BISMUTH/METRONIDAZ/TETRACYCLIN 140-125-125 MG PO CAPS: 3.0000 | ORAL_CAPSULE | Freq: Four times a day (QID) | ORAL | 0 refills | Status: DC

## 2021-07-16 ENCOUNTER — Other Ambulatory Visit (HOSPITAL_COMMUNITY): Payer: Self-pay

## 2021-07-16 ENCOUNTER — Telehealth: Payer: Self-pay | Admitting: Pharmacy Technician

## 2021-07-16 NOTE — Telephone Encounter (Signed)
Dr. Ardis Hughs please advise regarding the components listed below from pharmacy.

## 2021-07-16 NOTE — Telephone Encounter (Signed)
No clinical reason. We will send in scripts for the different components.

## 2021-07-17 ENCOUNTER — Other Ambulatory Visit: Payer: Self-pay

## 2021-07-17 MED ORDER — OMEPRAZOLE 40 MG PO CPDR: 40.0000 mg | DELAYED_RELEASE_CAPSULE | Freq: Two times a day (BID) | ORAL | 0 refills | Status: DC

## 2021-07-17 MED ORDER — BISMUTH 262 MG PO CHEW: 524.0000 mg | CHEWABLE_TABLET | Freq: Three times a day (TID) | ORAL | 0 refills | Status: DC

## 2021-07-17 MED ORDER — AMOXICILLIN 500 MG PO CAPS: 1000.0000 mg | ORAL_CAPSULE | Freq: Two times a day (BID) | ORAL | 0 refills | Status: DC

## 2021-07-17 MED ORDER — CLARITHROMYCIN 500 MG PO TABS: 500.0000 mg | ORAL_TABLET | Freq: Two times a day (BID) | ORAL | 0 refills | Status: DC

## 2021-07-17 NOTE — Telephone Encounter (Signed)
Scripts sent to pharmacy and pt aware.

## 2021-12-31 ENCOUNTER — Emergency Department (HOSPITAL_COMMUNITY)
Admission: EM | Admit: 2021-12-31 | Discharge: 2021-12-31 | Disposition: A | Discharge status: Home / Self Care | Payer: Medicare Other | Attending: Emergency Medicine | Admitting: Emergency Medicine

## 2021-12-31 ENCOUNTER — Other Ambulatory Visit: Payer: Self-pay

## 2021-12-31 DIAGNOSIS — Z1152 Encounter for screening for COVID-19: Secondary | ICD-10-CM | POA: Insufficient documentation

## 2021-12-31 DIAGNOSIS — I10 Essential (primary) hypertension: Secondary | ICD-10-CM | POA: Diagnosis not present

## 2021-12-31 DIAGNOSIS — X58XXXA Exposure to other specified factors, initial encounter: Secondary | ICD-10-CM | POA: Insufficient documentation

## 2021-12-31 DIAGNOSIS — Z79899 Other long term (current) drug therapy: Secondary | ICD-10-CM | POA: Diagnosis not present

## 2021-12-31 DIAGNOSIS — Z7982 Long term (current) use of aspirin: Secondary | ICD-10-CM | POA: Diagnosis not present

## 2021-12-31 DIAGNOSIS — F17210 Nicotine dependence, cigarettes, uncomplicated: Secondary | ICD-10-CM | POA: Insufficient documentation

## 2021-12-31 DIAGNOSIS — S39012A Strain of muscle, fascia and tendon of lower back, initial encounter: Secondary | ICD-10-CM | POA: Insufficient documentation

## 2021-12-31 DIAGNOSIS — S3992XA Unspecified injury of lower back, initial encounter: Secondary | ICD-10-CM | POA: Diagnosis present

## 2021-12-31 DIAGNOSIS — M791 Myalgia, unspecified site: Secondary | ICD-10-CM | POA: Diagnosis not present

## 2021-12-31 DIAGNOSIS — T148XXA Other injury of unspecified body region, initial encounter: Secondary | ICD-10-CM

## 2021-12-31 LAB — COMPREHENSIVE METABOLIC PANEL
ALT: 29 U/L (ref 0–44)
AST: 25 U/L (ref 15–41)
Albumin: 3.8 g/dL (ref 3.5–5.0)
Alkaline Phosphatase: 74 U/L (ref 38–126)
Anion gap: 9 (ref 5–15)
BUN: 11 mg/dL (ref 8–23)
CO2: 22 mmol/L (ref 22–32)
Calcium: 9.4 mg/dL (ref 8.9–10.3)
Chloride: 109 mmol/L (ref 98–111)
Creatinine, Ser: 1.22 mg/dL (ref 0.61–1.24)
GFR, Estimated: >60 mL/min (ref >60)
Glucose, Bld: 91 mg/dL (ref 70–99)
Potassium: 4.4 mmol/L (ref 3.5–5.1)
Sodium: 140 mmol/L (ref 135–145)
Total Bilirubin: 0.4 mg/dL (ref 0.3–1.2)
Total Protein: 7.6 g/dL (ref 6.5–8.1)

## 2021-12-31 LAB — CBC WITH DIFFERENTIAL/PLATELET
Abs Immature Granulocytes: 0.02 10*3/uL (ref 0.00–0.07)
Basophils Absolute: 0.1 10*3/uL (ref 0.0–0.1)
Basophils Relative: 1 %
Eosinophils Absolute: 0.2 10*3/uL (ref 0.0–0.5)
Eosinophils Relative: 2 %
HCT: 39.2 % (ref 39.0–52.0)
Hemoglobin: 12.5 g/dL — ABNORMAL LOW (ref 13.0–17.0)
Immature Granulocytes: 0 %
Lymphocytes Relative: 32 %
Lymphs Abs: 2.7 10*3/uL (ref 0.7–4.0)
MCH: 29.4 pg (ref 26.0–34.0)
MCHC: 31.9 g/dL (ref 30.0–36.0)
MCV: 92.2 fL (ref 80.0–100.0)
Monocytes Absolute: 0.6 10*3/uL (ref 0.1–1.0)
Monocytes Relative: 7 %
Neutro Abs: 4.8 10*3/uL (ref 1.7–7.7)
Neutrophils Relative %: 58 %
Platelets: 260 10*3/uL (ref 150–400)
RBC: 4.25 MIL/uL (ref 4.22–5.81)
RDW: 14.9 % (ref 11.5–15.5)
WBC: 8.5 10*3/uL (ref 4.0–10.5)
nRBC: 0 % (ref 0.0–0.2)

## 2021-12-31 LAB — RESP PANEL BY RT-PCR (FLU A&B, COVID) ARPGX2
Influenza A by PCR: NEGATIVE
Influenza B by PCR: NEGATIVE
SARS Coronavirus 2 by RT PCR: NEGATIVE

## 2021-12-31 LAB — LIPASE, BLOOD: Lipase: 27 U/L (ref 11–51)

## 2021-12-31 MED ORDER — ACETAMINOPHEN 500 MG PO TABS
1000.0000 mg | ORAL_TABLET | Freq: Once | ORAL | Status: AC
  Administered 2021-12-31: 1000 mg via ORAL
  Filled 2021-12-31: qty 2

## 2021-12-31 NOTE — ED Triage Notes (Signed)
Pt. Stated, Im having pain on m rt. And left sides at m hip bones but not my bones. Im hurting all over for the last 3 weeks.

## 2021-12-31 NOTE — ED Provider Triage Note (Signed)
Emergency Medicine Provider Triage Evaluation Note  Lance Goltz Sr. , a 66 y.o. male  was evaluated in triage.  Pt complains of bilateral side pain onset 3 weeks.  Has not tried any medications for symptoms.  Denies sick contacts.  Denies chest pain, shortness of breath, abdominal pain, nausea, vomiting, rhinorrhea, nasal congestion.  Review of Systems  Positive:  Negative:   Physical Exam  BP (!) 157/93 (BP Location: Right Arm)   Pulse 74   Temp 98.4 F (36.9 C)   Resp 16   Ht 6' (1.829 m)   Wt 105.2 kg   SpO2 97%   BMI 31.46 kg/m  Gen:   Awake, no distress   Resp:  Normal effort  MSK:   Moves extremities without difficulty  Other:  Tenderness to palpation noted to bilateral sides.  Medical Decision Making  Medically screening exam initiated at 10:42 AM.  Appropriate orders placed.  [place] was informed that the remainder of the evaluation will be completed by another provider, this initial triage assessment does not replace that evaluation, and the importance of remaining in the ED until their evaluation is complete.     Charlina Dwight A, PA-C 12/31/21 1047

## 2021-12-31 NOTE — Discharge Instructions (Signed)
We evaluated you for your back pain.  Your physical exam and laboratory test did not show signs of a dangerous condition.  Please try to keep up with exercise.  I have attached some back exercises and stretches you can do to help with your pain.  Please take 1000 mg of Tylenol every 6 hours as needed for pain.  You can also try lidocaine patch for your symptoms.  Please follow-up with your primary doctor in around 1 week.  Please return if you develop any severe pain or worsening pain, fainting or lightheadedness, vomiting, painful urination, numbness or tingling, weakness, incontinence, or any other concerning symptoms.

## 2021-12-31 NOTE — ED Provider Notes (Signed)
[name]  CSN: [id number] Arrival date & time: 12/31/21 0908  Chief Complaint(s) left and right side pain and Generalized Body Aches  HPI [name]. is a 66 y.o. male with a history of hypertension, hyperlipidemia presenting to the emergency department with bilateral back pain.  He reports 3 weeks of pain to his right and left lower back.  He reports that symptoms are worse with movement.  He denies any urinary symptoms such as dysuria, frequency, foul-smelling urine.  Denies any abdominal pain, lightheadedness, syncope, chest pain, shortness of breath, congestion, runny nose.  He reports that he used to go on a mile walk every morning but has not done this for the past month as he was dealing with other stuff in his life.  No weight loss, night sweats.  No numbness, tingling, bowel or bladder incontinence.  Symptoms mild.  Has not taken anything for the pain at home.   Past Medical History Past Medical History:  Diagnosis Date   Dyslipidemia    History of gout    Hypertension    Normal coronary arteries 12/2007   Patient Active Problem List   Diagnosis Date Noted   Positive FIT (fecal immunochemical test) 05/20/2021   Normocytic anemia 05/20/2021   Near syncope 03/06/2017   Elevated troponin 03/06/2017   Hyperglycemia 03/06/2017   Hypertension    Dyslipidemia    History of gout    Normal coronary arteries 12/26/2007   Home Medication(s) Prior to Admission medications   Medication Sig Start Date End Date Taking? Authorizing Provider  allopurinol (ZYLOPRIM) 100 MG tablet Take 100 mg by mouth daily. 09/03/19   [provider]  amoxicillin (AMOXIL) 500 MG capsule Take 2 capsules (1,000 mg total) by mouth 2 (two) times daily. 07/17/21   Milus Banister, MD  aspirin EC 81 MG EC tablet Take 1 tablet (81 mg total) by mouth daily. 03/08/17   Kayleen Memos, DO  atorvastatin (LIPITOR) 40 MG tablet Take 1 tablet (40 mg  total) by mouth daily at 6 PM. 03/07/17   Kayleen Memos, DO  benzonatate (TESSALON) 100 MG capsule Take 1 capsule (100 mg total) by mouth 3 (three) times daily as needed for cough. Patient not taking: Reported on 05/20/2021 01/18/21   Antonietta Breach, PA-C  Bismuth 262 MG CHEW Chew 524 mg by mouth 4 (four) times daily - after meals and at bedtime. 07/17/21   Milus Banister, MD  clarithromycin (BIAXIN) 500 MG tablet Take 1 tablet (500 mg total) by mouth 2 (two) times daily. 07/17/21   Milus Banister, MD  diclofenac sodium (VOLTAREN) 1 % GEL Apply 2 g topically 4 (four) times daily. Patient not taking: Reported on 03/06/2017 09/29/16   Ok Edwards, PA-C  HYDROcodone-acetaminophen (NORCO/VICODIN) 5-325 MG tablet Take 1 tablet by mouth every 6 (six) hours as needed. 04/19/21   Blanchie Dessert, MD  lisinopril-hydrochlorothiazide (PRINZIDE,ZESTORETIC) 20-12.5 MG per tablet Take 1 tablet by mouth Daily. 09/30/11   [provider]  meloxicam (MOBIC) 15 MG tablet Take 15 mg by mouth daily as needed for pain. Patient not taking: Reported on 05/20/2021    [provider]  methocarbamol (ROBAXIN) 500 MG tablet Take 1 tablet (500 mg total) by mouth 2 (two) times daily. Patient not taking: Reported on 10/02/2019 01/29/18   Lorayne Bender, PA-C  Multiple Vitamins-Minerals (MULTIVITAMIN WITH MINERALS) tablet Take 1 tablet by mouth daily.    [provider]  omeprazole (PRILOSEC OTC) 20 MG tablet Take 20 mg by mouth daily as needed (acid reflux).    [provider]  omeprazole (PRILOSEC) 40 MG capsule Take 1 capsule (40 mg total) by mouth in the morning and at bedtime. 07/17/21   Milus Banister, MD  ondansetron (ZOFRAN) 4 MG tablet Take 1 tablet (4 mg total) by mouth every 6 (six) hours. Patient not taking: Reported on 05/20/2021 07/21/16   Montine Circle, PA-C  predniSONE (DELTASONE) 20 MG tablet Take 2 tablets (40 mg total) by mouth daily. Patient not taking: Reported on 05/20/2021 04/19/21    Blanchie Dessert, MD  rosuvastatin (CRESTOR) 10 MG tablet Take 10 mg by mouth daily. 05/13/21   [provider]                                                                                                                                    Past Surgical History Past Surgical History:  Procedure Laterality Date   CARDIAC CATHETERIZATION  12/2007   normal coronaries   Family History Family History  Problem Relation Age of Onset   Dementia Mother    Alcohol abuse Father    Colon cancer Maternal Uncle    Colon cancer Cousin    Stomach cancer Neg Hx    Esophageal cancer Neg Hx    Colon polyps Neg Hx     Social History Social History   Tobacco Use   Smoking status: Every Day    Packs/day: 1.00    Types: Cigarettes   Smokeless tobacco: Never  Vaping Use   Vaping Use: Never used  Substance Use Topics   Alcohol use: No   Drug use: No   Allergies Patient has no known allergies.  Review of Systems Review of Systems  All other systems reviewed and are negative.   Physical Exam Vital Signs  I have reviewed the triage vital signs BP (!) 151/100   Pulse 72   Temp 98 F (36.7 C)   Resp 18   Ht 6' (1.829 m)   Wt 105.2 kg   SpO2 97%   BMI 31.46 kg/m  Physical Exam Vitals and nursing note reviewed.  Constitutional:      General: He is not in acute distress.    Appearance: Normal appearance.  HENT:     Mouth/Throat:     Mouth: Mucous membranes are moist.  Eyes:     Conjunctiva/sclera: Conjunctivae normal.  Cardiovascular:     Rate and Rhythm: Normal rate and regular rhythm.  Pulmonary:     Effort: Pulmonary effort is normal. No respiratory distress.     Breath sounds: Normal breath sounds.  Abdominal:     General: Abdomen is flat.     Palpations: Abdomen is soft.     Tenderness: There is no abdominal tenderness. There is no right CVA tenderness or left CVA tenderness.  Musculoskeletal:     Right lower leg:  No edema.     Left lower leg: No edema.      Comments: No midline C, T, L-spine tenderness.  Mild paraspinal bilateral lumbar tenderness  Skin:    General: Skin is warm and dry.     Capillary Refill: Capillary refill takes less than 2 seconds.  Neurological:     Mental Status: He is alert and oriented to person, place, and time. Mental status is at baseline.  Psychiatric:        Mood and Affect: Mood normal.        Behavior: Behavior normal.     ED Results and Treatments Labs (all labs ordered are listed, but only abnormal results are displayed) Labs Reviewed  CBC WITH DIFFERENTIAL/PLATELET - Abnormal; Notable for the following components:      Result Value   Hemoglobin 12.5 (*)    All other components within normal limits  RESP PANEL BY RT-PCR (FLU A&B, COVID) ARPGX2  LIPASE, BLOOD  COMPREHENSIVE METABOLIC PANEL                                                                                                                          Radiology No results found.  Pertinent labs & imaging results that were available during my care of the patient were reviewed by me and considered in my medical decision making (see MDM for details).  Medications Ordered in ED Medications  acetaminophen (TYLENOL) tablet 1,000 mg (has no administration in time range)                                                                                                                                     Procedures Procedures  (including critical care time)  Medical Decision Making / ED Course   MDM:  66 year old male presenting to the emergency department with back pain.  Patient well-appearing, physical exam with no midline tenderness.  Patient otherwise well-appearing.  Vitals notable for some hypertension.  Suspect likely muscle pain.  He reports that he has been more sedentary over the last month after previously being more active.  Doubt rhabdomyolysis, no diffuse muscular tenderness or significant exercise.  Doubt urinary pathology as  he has no symptoms and no CVA tenderness.  No midline pain to suggest spinal cord pathology, occult fracture or infectious process.  No B symptoms to suggest occult malignancy.  Doubt aortic pathology with 3 weeks of symptoms  that are intermittent and mild, reassuring labs including normal hemoglobin.  Will trial Tylenol, lidocaine patch, increase stretching and gentle exercise for muscle strain.  Advised follow-up with primary physician. Will discharge patient to home. All questions answered. Patient comfortable with plan of discharge. Return precautions discussed with patient and specified on the after visit summary.        Lab Tests: -I ordered, reviewed, and interpreted labs.   The pertinent results include:   Labs Reviewed  CBC WITH DIFFERENTIAL/PLATELET - Abnormal; Notable for the following components:      Result Value   Hemoglobin 12.5 (*)    All other components within normal limits  RESP PANEL BY RT-PCR (FLU A&B, COVID) ARPGX2  LIPASE, BLOOD  COMPREHENSIVE METABOLIC PANEL    Notable for mild anemia   Medicines ordered and prescription drug management: Meds ordered this encounter  Medications   acetaminophen (TYLENOL) tablet 1,000 mg    -I have reviewed the patients home medicines and have made adjustments as needed   Social Determinants of Health:  Diagnosis or treatment significantly limited by social determinants of health: obesity   Reevaluation: After the interventions noted above, I reevaluated the patient and found that they have improved  Co morbidities that complicate the patient evaluation  Past Medical History:  Diagnosis Date   Dyslipidemia    History of gout    Hypertension    Normal coronary arteries 12/2007      Dispostion: Disposition decision including need for hospitalization was considered, and patient discharged from emergency department.    Final Clinical Impression(s) / ED Diagnoses Final diagnoses:  Muscle strain     This  chart was dictated using voice recognition software.  Despite best efforts to proofread,  errors can occur which can change the documentation meaning.    Cristie Hem, MD 12/31/21 [phone]

## 2022-08-27 ENCOUNTER — Other Ambulatory Visit: Payer: Self-pay | Admitting: Internal Medicine

## 2022-08-27 DIAGNOSIS — F17211 Nicotine dependence, cigarettes, in remission: Secondary | ICD-10-CM

## 2023-02-05 ENCOUNTER — Other Ambulatory Visit: Payer: Self-pay

## 2023-02-05 ENCOUNTER — Emergency Department (HOSPITAL_COMMUNITY): Payer: Medicare Other

## 2023-02-05 ENCOUNTER — Encounter (HOSPITAL_COMMUNITY): Payer: Self-pay | Admitting: *Deleted

## 2023-02-05 ENCOUNTER — Emergency Department (HOSPITAL_COMMUNITY)
Admission: EM | Admit: 2023-02-05 | Discharge: 2023-02-05 | Disposition: A | Discharge status: Home / Self Care | Payer: Medicare Other | Attending: Emergency Medicine | Admitting: Emergency Medicine

## 2023-02-05 DIAGNOSIS — R0602 Shortness of breath: Secondary | ICD-10-CM | POA: Insufficient documentation

## 2023-02-05 DIAGNOSIS — Z7982 Long term (current) use of aspirin: Secondary | ICD-10-CM | POA: Diagnosis not present

## 2023-02-05 DIAGNOSIS — R42 Dizziness and giddiness: Secondary | ICD-10-CM | POA: Diagnosis not present

## 2023-02-05 DIAGNOSIS — R079 Chest pain, unspecified: Secondary | ICD-10-CM | POA: Diagnosis present

## 2023-02-05 LAB — CBC
HCT: 39 % (ref 39.0–52.0)
Hemoglobin: 12.9 g/dL — ABNORMAL LOW (ref 13.0–17.0)
MCH: 30.8 pg (ref 26.0–34.0)
MCHC: 33.1 g/dL (ref 30.0–36.0)
MCV: 93.1 fL (ref 80.0–100.0)
Platelets: 258 10*3/uL (ref 150–400)
RBC: 4.19 MIL/uL — ABNORMAL LOW (ref 4.22–5.81)
RDW: 14.2 % (ref 11.5–15.5)
WBC: 7.1 10*3/uL (ref 4.0–10.5)
nRBC: 0 % (ref 0.0–0.2)

## 2023-02-05 LAB — BASIC METABOLIC PANEL
Anion gap: 11 (ref 5–15)
BUN: 16 mg/dL (ref 8–23)
CO2: 19 mmol/L — ABNORMAL LOW (ref 22–32)
Calcium: 9.5 mg/dL (ref 8.9–10.3)
Chloride: 108 mmol/L (ref 98–111)
Creatinine, Ser: 1.19 mg/dL (ref 0.61–1.24)
GFR, Estimated: >60 mL/min (ref >60)
Glucose, Bld: 104 mg/dL — ABNORMAL HIGH (ref 70–99)
Potassium: 4.2 mmol/L (ref 3.5–5.1)
Sodium: 138 mmol/L (ref 135–145)

## 2023-02-05 LAB — TROPONIN I (HIGH SENSITIVITY)
Troponin I (High Sensitivity): 7 ng/L (ref <18)
Troponin I (High Sensitivity): 5 ng/L (ref <18)

## 2023-02-05 MED ORDER — LIDOCAINE 5 % EX PTCH
1.0000 | MEDICATED_PATCH | CUTANEOUS | Status: DC
  Administered 2023-02-05: 1 via TRANSDERMAL
  Filled 2023-02-05: qty 1

## 2023-02-05 MED ORDER — ACETAMINOPHEN 500 MG PO TABS
1000.0000 mg | ORAL_TABLET | Freq: Once | ORAL | Status: AC
  Administered 2023-02-05: 1000 mg via ORAL
  Filled 2023-02-05: qty 2

## 2023-02-05 MED ORDER — NITROGLYCERIN 0.4 MG SL SUBL
0.4000 mg | SUBLINGUAL_TABLET | Freq: Once | SUBLINGUAL | Status: AC
  Administered 2023-02-05: 0.4 mg via SUBLINGUAL
  Filled 2023-02-05: qty 1

## 2023-02-05 NOTE — Discharge Instructions (Signed)
 You have been seen here in the emergency department for chest pain. We have obtained a full history, performed a physical exam, in addition to other diagnostic tests and treatments. Right now, we feel that you are safe for discharge from a medical perspective, and do not have an acute life threatening illness.   To do: 1.) Take all medications as prescribed.   2.) If anything changes, or you develop fevers, chills, inability to eat or drink, severe pain, new symptoms, return of symptoms, worsening of symptoms, or any other concerns, please call 911 or come back to the emergency department as soon as possible.   3.) Please make an appointment with your primary care doctor for a follow-up visit after being seen here in the emergency department.   Thank you for allowing me to take care of you today. We hope that you feel better soon.

## 2023-02-05 NOTE — ED Provider Notes (Signed)
 Patient care assumed from previous provider.   Patient care of [name] is a 68 y.o. male from previous provider. Please see the original provider note from this emergency department encounter for full history and physical.   Course of Care and my assessment at the time of sign out is detailed in the ED Course below.   Clinical Course as of 02/05/23 1732  Sat Feb 05, 2023  1501 Stable 44M cp and sob for a week, EKG normal.  Awaiting troponins and labs and cxr  Two troponins and discharges.   [JL]  1509 Htn, hld [JL]    Clinical Course User Index [JL] Gaetano Pac, MD    This was a patient signed out to me, 68 year old male history of hypertension hyperlipidemia had chest pain and shortness of breath for a week.  He has had 2 negative troponins, his ECG shows normal sinus rhythm with no signs of ischemia, his chest x-ray shows old scarring but no acute process, his hemoglobin is 12.9 which is stable from prior, largely normal metabolic panel his bicarbonate is slightly on the lower side.   2 negative troponins  Do not think this a pulmonary embolism, neither did previous provider, no history of cancer no hemoptysis no unilateral calf pain no history of DVT or PE.  Patient has no chest pain at this point.  Will discharge the patient at this time, and he is also low risk by Wells criteria.     Gaetano Pac, MD 02/05/23 1736    Albertina Dixon, MD 02/06/23 [phone]

## 2023-02-05 NOTE — ED Provider Notes (Signed)
 [place] [facility] AT [facility] Provider Note   CSN: [id number] Arrival date & time: 02/05/23  1403     History  Chief Complaint  Patient presents with   Chest Pain   Dizziness    Lance Barnett. is a 68 y.o. male.  Patient complains of chest pain.  Patient reports that he has been having the pain for the past week.  Patient reports pain is getting worse.  Patient reports he feels short of breath.  Patient denies any fever or chills.  Patient reports he has had previous chest pain in the past.  Patient denies any exposure to flu or COVID.  The history is provided by the patient. No language interpreter was used.  Chest Pain Pain quality: aching   Pain radiates to:  Does not radiate Pain severity:  Moderate Timing:  Constant Associated symptoms: dizziness   Dizziness Associated symptoms: chest pain        Home Medications Prior to Admission medications   Medication Sig Start Date End Date Taking? Authorizing Provider  allopurinol (ZYLOPRIM) 100 MG tablet Take 100 mg by mouth daily. 09/03/19   [provider]  amoxicillin  (AMOXIL ) 500 MG capsule Take 2 capsules (1,000 mg total) by mouth 2 (two) times daily. 07/17/21   Teressa Toribio SQUIBB, MD  aspirin  EC 81 MG EC tablet Take 1 tablet (81 mg total) by mouth daily. 03/08/17   Shona Terry SAILOR, DO  atorvastatin  (LIPITOR ) 40 MG tablet Take 1 tablet (40 mg total) by mouth daily at 6 PM. 03/07/17   Shona Terry SAILOR, DO  benzonatate  (TESSALON ) 100 MG capsule Take 1 capsule (100 mg total) by mouth 3 (three) times daily as needed for cough. Patient not taking: Reported on 05/20/2021 01/18/21   Keith Sor, PA-C  Bismuth  262 MG CHEW Chew 524 mg by mouth 4 (four) times daily - after meals and at bedtime. 07/17/21   Teressa Toribio SQUIBB, MD  clarithromycin  (BIAXIN ) 500 MG tablet Take 1 tablet (500 mg total) by mouth 2 (two) times daily. 07/17/21   Teressa Toribio SQUIBB, MD  diclofenac  sodium (VOLTAREN ) 1 % GEL Apply 2 g  topically 4 (four) times daily. Patient not taking: Reported on 03/06/2017 09/29/16   Babara Greig GAILS, PA-C  HYDROcodone -acetaminophen  (NORCO/VICODIN) 5-325 MG tablet Take 1 tablet by mouth every 6 (six) hours as needed. 04/19/21   Doretha Folks, MD  lisinopril -hydrochlorothiazide  (PRINZIDE ,ZESTORETIC ) 20-12.5 MG per tablet Take 1 tablet by mouth Daily. 09/30/11   [provider]  meloxicam (MOBIC) 15 MG tablet Take 15 mg by mouth daily as needed for pain. Patient not taking: Reported on 05/20/2021    [provider]  methocarbamol  (ROBAXIN ) 500 MG tablet Take 1 tablet (500 mg total) by mouth 2 (two) times daily. Patient not taking: Reported on 10/02/2019 01/29/18   Zada Elouise BROCKS, PA-C  Multiple Vitamins-Minerals (MULTIVITAMIN WITH MINERALS) tablet Take 1 tablet by mouth daily.    [provider]  omeprazole  (PRILOSEC OTC) 20 MG tablet Take 20 mg by mouth daily as needed (acid reflux).    [provider]  omeprazole  (PRILOSEC) 40 MG capsule Take 1 capsule (40 mg total) by mouth in the morning and at bedtime. 07/17/21   Teressa Toribio SQUIBB, MD  ondansetron  (ZOFRAN ) 4 MG tablet Take 1 tablet (4 mg total) by mouth every 6 (six) hours. Patient not taking: Reported on 05/20/2021 07/21/16   Vicky Charleston, PA-C  predniSONE  (DELTASONE ) 20 MG tablet Take 2 tablets (40  mg total) by mouth daily. Patient not taking: Reported on 05/20/2021 04/19/21   Doretha Folks, MD  rosuvastatin (CRESTOR) 10 MG tablet Take 10 mg by mouth daily. 05/13/21   [provider]      Allergies    Patient has no known allergies.    Review of Systems   Review of Systems  Cardiovascular:  Positive for chest pain.  Neurological:  Positive for dizziness.  All other systems reviewed and are negative.   Physical Exam Updated Vital Signs BP 134/84   Pulse 78   Temp 98.2 F (36.8 C)   Resp 12   Ht 6' (1.829 m)   Wt 101.6 kg   SpO2 99%   BMI 30.38 kg/m  Physical Exam Vitals and nursing note  reviewed.  Constitutional:      Appearance: He is well-developed.  HENT:     Head: Normocephalic.  Cardiovascular:     Rate and Rhythm: Normal rate.     Heart sounds: Normal heart sounds.  Pulmonary:     Effort: Pulmonary effort is normal.  Abdominal:     General: There is no distension.  Musculoskeletal:        General: Normal range of motion.     Cervical back: Normal range of motion.  Skin:    General: Skin is warm.  Neurological:     General: No focal deficit present.     Mental Status: He is alert and oriented to person, place, and time.     ED Results / Procedures / Treatments   Labs (all labs ordered are listed, but only abnormal results are displayed) Labs Reviewed  BASIC METABOLIC PANEL  CBC  TROPONIN I (HIGH SENSITIVITY)    EKG EKG Interpretation Date/Time:  Saturday February 05 2023 14:16:49 EST Ventricular Rate:  80 PR Interval:  144 QRS Duration:  76 QT Interval:  364 QTC Calculation: 419 R Axis:   61  Text Interpretation: Normal sinus rhythm Normal ECG No significant change since last tracing Confirmed by Emil Share [phone]) on 02/05/2023 2:40:21 PM  Radiology No results found.  Procedures Procedures    Medications Ordered in ED Medications - No data to display  ED Course/ Medical Decision Making/ A&P Clinical Course as of 02/05/23 1519  Sat Feb 05, 2023  1501 Stable 71M cp and sob for a week, EKG normal.  Awaiting troponins and labs and cxr  Two troponins and discharges.   [JL]  1509 Htn, hld [JL]    Clinical Course User Index [JL] Gaetano Pac, MD                                 Medical Decision Making Pt complains of chest pain and shortness of breath   Amount and/or Complexity of Data Reviewed Independent Historian: parent Labs: ordered. Decision-making details documented in ED Course.    Details: Labs ordered Radiology: ordered. ECG/medicine tests: ordered and independent interpretation performed. Decision-making details  documented in ED Course.    Details: EKG  normal sinus  normal EKG.   Pt's care turned over to Dr. Gaetano        Final Clinical Impression(s) / ED Diagnoses Final diagnoses:  Chest pain, unspecified type    Rx / DC Orders ED Discharge Orders     None         Antoine Vandermeulen K, PA-C 02/05/23 1520    Emil Share, [place] 02/06/23 [phone]

## 2023-02-05 NOTE — ED Triage Notes (Signed)
 C/o chest pain onset 1 week ago , pain is worse with deep breath denies cough c/o dizziness onset several days ago

## 2023-02-05 NOTE — ED Notes (Signed)
 Pt transported to xray

## 2023-09-14 ENCOUNTER — Other Ambulatory Visit: Payer: Self-pay | Admitting: Internal Medicine

## 2023-09-14 DIAGNOSIS — Z136 Encounter for screening for cardiovascular disorders: Secondary | ICD-10-CM

## 2023-09-15 ENCOUNTER — Other Ambulatory Visit: Payer: Self-pay | Admitting: Internal Medicine

## 2023-09-15 DIAGNOSIS — F17211 Nicotine dependence, cigarettes, in remission: Secondary | ICD-10-CM

## 2023-10-01 ENCOUNTER — Emergency Department (HOSPITAL_COMMUNITY)

## 2023-10-01 ENCOUNTER — Emergency Department (HOSPITAL_COMMUNITY)
Admission: EM | Admit: 2023-10-01 | Discharge: 2023-10-01 | Disposition: A | Discharge status: Home / Self Care | Attending: Emergency Medicine | Admitting: Emergency Medicine

## 2023-10-01 ENCOUNTER — Other Ambulatory Visit: Payer: Self-pay

## 2023-10-01 DIAGNOSIS — Z23 Encounter for immunization: Secondary | ICD-10-CM | POA: Insufficient documentation

## 2023-10-01 DIAGNOSIS — Z79899 Other long term (current) drug therapy: Secondary | ICD-10-CM | POA: Insufficient documentation

## 2023-10-01 DIAGNOSIS — S90221A Contusion of right lesser toe(s) with damage to nail, initial encounter: Secondary | ICD-10-CM

## 2023-10-01 DIAGNOSIS — S92421B Displaced fracture of distal phalanx of right great toe, initial encounter for open fracture: Secondary | ICD-10-CM | POA: Insufficient documentation

## 2023-10-01 DIAGNOSIS — S92491B Other fracture of right great toe, initial encounter for open fracture: Secondary | ICD-10-CM

## 2023-10-01 DIAGNOSIS — I10 Essential (primary) hypertension: Secondary | ICD-10-CM | POA: Diagnosis not present

## 2023-10-01 DIAGNOSIS — M79674 Pain in right toe(s): Secondary | ICD-10-CM | POA: Diagnosis present

## 2023-10-01 DIAGNOSIS — W208XXA Other cause of strike by thrown, projected or falling object, initial encounter: Secondary | ICD-10-CM | POA: Insufficient documentation

## 2023-10-01 MED ORDER — CEPHALEXIN 500 MG PO CAPS: 500.0000 mg | ORAL_CAPSULE | Freq: Four times a day (QID) | ORAL | 0 refills | Status: AC

## 2023-10-01 MED ORDER — OXYCODONE HCL 5 MG PO CAPS: 5.0000 mg | ORAL_CAPSULE | Freq: Four times a day (QID) | ORAL | 0 refills | Status: AC | PRN

## 2023-10-01 MED ORDER — TETANUS-DIPHTH-ACELL PERTUSSIS 5-2.5-18.5 LF-MCG/0.5 IM SUSY
0.5000 mL | PREFILLED_SYRINGE | Freq: Once | INTRAMUSCULAR | Status: AC
  Administered 2023-10-01: 0.5 mL via INTRAMUSCULAR
  Filled 2023-10-01: qty 0.5

## 2023-10-01 MED ORDER — LIDOCAINE HCL (PF) 1 % IJ SOLN
10.0000 mL | Freq: Once | INTRAMUSCULAR | Status: DC
  Filled 2023-10-01: qty 10

## 2023-10-01 MED ORDER — NAPROXEN 500 MG PO TABS: 500.0000 mg | ORAL_TABLET | Freq: Two times a day (BID) | ORAL | 0 refills | Status: AC

## 2023-10-01 MED ORDER — OXYCODONE HCL 5 MG PO TABS
5.0000 mg | ORAL_TABLET | Freq: Once | ORAL | Status: AC
  Administered 2023-10-01: 5 mg via ORAL
  Filled 2023-10-01: qty 1

## 2023-10-01 NOTE — Progress Notes (Signed)
 Orthopedic Tech Progress Note Patient Details:  Lance GUGLIELMO Sr. [birth year] [id number]  Ortho Devices Type of Ortho Device: Postop shoe/boot, Crutches Ortho Device/Splint Location: RLE Ortho Device/Splint Interventions: Application   Post Interventions Patient Tolerated: Well  [name] 10/01/2023, 7:28 PM

## 2023-10-01 NOTE — Discharge Instructions (Addendum)
 I would recommend Tylenol  1000mg  every 8 hours and naproxen  every 12 hours for pain control. Wear post-op shoe and use crutches. Take oxycodone  for breakthrough pain.  You can also try ice.  Follow-up with Dr. Celena at [facility] and return to ER with new or worsening symptoms.

## 2023-10-01 NOTE — ED Triage Notes (Signed)
 According to [facility]:A desk fell on pts foot while prepping it for a move.The desk fell on big toe splitting it inside. Toe swollen, with black and blue toe nail. Desk may have also fall on second toe and top of foot.pedal pulse present.  Known Hx of hypertension and cholesterol.  Vitals:  160/80 Hr 74 18 RR 97% spo2 on room air.

## 2023-10-01 NOTE — ED Provider Notes (Signed)
 [place] EMERGENCY DEPARTMENT AT [facility] Provider Note   CSN: [id number] Arrival date & time: 10/01/23  1713     Patient presents with: No chief complaint on file.   Lance KAI Sr. is a 68 y.o. male with past medical history of hypertension, hyperlipidemia, gout presents emergency room with complaint of toe injury.  Patient reports that he was helping his son move a heavy desk when he dropped his side of the desk onto his right toe.  He is locating pain to primarily his great toe and second toe on the right foot.  He has no numbness or tingling.  He does have bleeding from the medial aspect of right nail bed. Unsure of tdap.   HPI     Prior to Admission medications   Medication Sig Start Date End Date Taking? Authorizing Provider  allopurinol (ZYLOPRIM) 300 MG tablet Take 300 mg by mouth daily.    [provider]  lisinopril -hydrochlorothiazide  (PRINZIDE ,ZESTORETIC ) 20-12.5 MG per tablet Take 2 tablets by mouth Daily. 09/30/11   [provider]  Multiple Vitamins-Minerals (MULTIVITAMIN WITH MINERALS) tablet Take 1 tablet by mouth daily.    [provider]  rosuvastatin (CRESTOR) 10 MG tablet Take 10 mg by mouth daily. 05/13/21   [provider]    Allergies: Patient has no known allergies.    Review of Systems  Skin:  Positive for wound.    Updated Vital Signs BP 134/71   Pulse 70   Resp 18   Ht 6' (1.829 m)   Wt 101.2 kg   SpO2 100%   BMI 30.24 kg/m   Physical Exam Vitals and nursing note reviewed.  Constitutional:      General: He is not in acute distress.    Appearance: He is not toxic-appearing.  HENT:     Head: Normocephalic and atraumatic.  Eyes:     General: No scleral icterus.    Conjunctiva/sclera: Conjunctivae normal.  Cardiovascular:     Rate and Rhythm: Normal rate and regular rhythm.     Pulses: Normal pulses.     Heart sounds: Normal heart sounds.  Pulmonary:     Effort: Pulmonary effort is  normal. No respiratory distress.     Breath sounds: Normal breath sounds.  Abdominal:     General: Abdomen is flat. Bowel sounds are normal.     Palpations: Abdomen is soft.     Tenderness: There is no abdominal tenderness.  Musculoskeletal:     Right lower leg: No edema.     Left lower leg: No edema.  Skin:    General: Skin is warm and dry.     Findings: No lesion.     Comments: Oozing blood from right great toe nail. TTP over aspect of 1st and 2nd digit. Strong DP pulse.   Neurological:     General: No focal deficit present.     Mental Status: He is alert and oriented to person, place, and time. Mental status is at baseline.     (all labs ordered are listed, but only abnormal results are displayed) Labs Reviewed - No data to display  EKG: None  Radiology: DG Foot Complete Right Result Date: 10/01/2023 CLINICAL DATA:  Distal foot pain, first and second toe lacerations. Desk fell on right foot. EXAM: RIGHT FOOT COMPLETE - 3+ VIEW COMPARISON:  None Available. FINDINGS: There is a displaced fracture of the distal phalanx of the first digit extending through the tuft and shaft. There is no definite  evidence of fracture at the second digit. No dislocation is seen. Degenerative changes are present at the first metatarsophalangeal joint and midfoot. Soft tissue swelling is noted at the first and second digits. IMPRESSION: Displaced fracture of the distal phalanx of the first digit. Electronically Signed   By: Leita Birmingham M.D.   On: 10/01/2023 18:05     .Nerve Block  Date/Time: 10/01/2023 6:54 PM  Performed by: Shermon Warren SAILOR, PA-C Authorized by: Shermon Warren SAILOR, PA-C   Consent:    Consent obtained:  Verbal   Consent given by:  Patient   Risks, benefits, and alternatives were discussed: yes     Risks discussed:  Allergic reaction, bleeding, intravenous injection, infection, nerve damage, pain, unsuccessful block and swelling   Alternatives discussed:  Referral, alternative  treatment, delayed treatment and no treatment Universal protocol:    Procedure explained and questions answered to patient or proxy's satisfaction: yes     Relevant documents present and verified: yes     Test results available: yes     Imaging studies available: yes     Required blood products, implants, devices, and special equipment available: yes     Site/side marked: yes     Immediately prior to procedure, a time out was called: yes     Patient identity confirmed:  Verbally with patient Indications:    Indications:  Pain relief Location:    Body area:  Lower extremity   Lower extremity nerve blocked: right great toe.   Laterality:  Right Pre-procedure details:    Skin preparation:  Chlorhexidine Skin anesthesia:    Skin anesthesia method:  None Procedure details:    Block needle gauge:  25 G   Anesthetic injected:  Lidocaine  1% w/o epi Post-procedure details:    Dressing:  Sterile dressing   Outcome:  Pain relieved   Procedure completion:  Tolerated    Medications Ordered in the ED  lidocaine  (PF) (XYLOCAINE ) 1 % injection 10 mL (has no administration in time range)  oxyCODONE  (Oxy IR/ROXICODONE ) immediate release tablet 5 mg (5 mg Oral Given 10/01/23 1809)  Tdap (BOOSTRIX ) injection 0.5 mL (0.5 mLs Intramuscular Given 10/01/23 1809)                                    Medical Decision Making Amount and/or Complexity of Data Reviewed Radiology: ordered.  Risk Prescription drug management.   This patient presents to the ED for concern of toe pain, this involves an extensive number of treatment options, and is a complaint that carries with it a high risk of complications and morbidity.  The differential diagnosis includes gout, fracture, abscess, laceration  Imaging Studies ordered:  I ordered imaging studies including right foot I independently visualized and interpreted imaging which showed distal phalanx fracture of first digit I agree with the radiologist  interpretation   Cardiac Monitoring: / EKG:  The patient was maintained on a cardiac monitor.     Problem List / ED Course / Critical interventions / Medication management  Patient reporting to emergency room after he dropped a dresser on right toe.  Primarily complaining of right great toe pain.  He is neurovascularly intact.  His x-ray shows distal great toe fracture.  He also has small laceration under nail blood that is draining.  Bleeding stopped with direct pressure.  I did do a nerve block of right great toe for pain relief.  I cleaned  the area with soap and flushed out with water and covered. I ordered medication including oxycodone , tetanus Reevaluation of the patient after these medicines showed that the patient improved I have reviewed the patients home medicines and have made adjustments as needed. Patient was supplied with postop shoe and he will use his crutches.  Discussed pain control, RICE and follow-up.  Stable for discharge with outpatient follow-up.  Given Keflex  for antibiotic prophylaxis.        Final diagnoses:  Other fracture of right great toe, initial encounter for open fracture  Subungual hematoma of right foot, initial encounter    ED Discharge Orders          Ordered    oxycodone  (OXY-IR) 5 MG capsule  Every 6 hours PRN        10/01/23 1840    naproxen  (NAPROSYN ) 500 MG tablet  2 times daily        10/01/23 1840    cephALEXin  (KEFLEX ) 500 MG capsule  4 times daily        10/01/23 1856               Gita Dilger, [place] SAILOR, PA-C 10/01/23 1857    Dean Clarity, MD 10/01/23 1930
